# Patient Record
Sex: Female | Born: 1945
Health system: Southern US, Community
[De-identification: ages and names within clinical notes are randomized; demographics above are authoritative.]

## PROBLEM LIST (undated history)

## (undated) DIAGNOSIS — E039 Hypothyroidism, unspecified: Secondary | ICD-10-CM

## (undated) DIAGNOSIS — E119 Type 2 diabetes mellitus without complications: Secondary | ICD-10-CM

## (undated) DIAGNOSIS — E079 Disorder of thyroid, unspecified: Secondary | ICD-10-CM

## (undated) HISTORY — PX: BACK SURGERY: SHX140

## (undated) HISTORY — PX: TOTAL HIP ARTHROPLASTY: SHX124

## (undated) HISTORY — PX: SINOSCOPY: SHX187

## (undated) HISTORY — PX: ABDOMINAL HYSTERECTOMY: SHX81

## (undated) HISTORY — PX: TONSILLECTOMY: SUR1361

## (undated) HISTORY — PX: ADENOIDECTOMY: SUR15

---

## 2010-09-22 DIAGNOSIS — I340 Nonrheumatic mitral (valve) insufficiency: Secondary | ICD-10-CM | POA: Insufficient documentation

## 2010-09-22 DIAGNOSIS — R921 Mammographic calcification found on diagnostic imaging of breast: Secondary | ICD-10-CM | POA: Insufficient documentation

## 2011-10-04 DIAGNOSIS — Z789 Other specified health status: Secondary | ICD-10-CM | POA: Insufficient documentation

## 2012-07-08 DIAGNOSIS — M5137 Other intervertebral disc degeneration, lumbosacral region: Secondary | ICD-10-CM | POA: Insufficient documentation

## 2014-08-26 DIAGNOSIS — E119 Type 2 diabetes mellitus without complications: Secondary | ICD-10-CM | POA: Insufficient documentation

## 2015-06-22 DIAGNOSIS — M8589 Other specified disorders of bone density and structure, multiple sites: Secondary | ICD-10-CM | POA: Insufficient documentation

## 2016-06-27 DIAGNOSIS — D509 Iron deficiency anemia, unspecified: Secondary | ICD-10-CM | POA: Insufficient documentation

## 2016-07-10 DIAGNOSIS — G2581 Restless legs syndrome: Secondary | ICD-10-CM | POA: Insufficient documentation

## 2018-03-19 DIAGNOSIS — M26629 Arthralgia of temporomandibular joint, unspecified side: Secondary | ICD-10-CM | POA: Insufficient documentation

## 2019-10-01 DIAGNOSIS — Z9889 Other specified postprocedural states: Secondary | ICD-10-CM | POA: Insufficient documentation

## 2019-10-27 DIAGNOSIS — E559 Vitamin D deficiency, unspecified: Secondary | ICD-10-CM | POA: Diagnosis not present

## 2019-10-27 DIAGNOSIS — E1165 Type 2 diabetes mellitus with hyperglycemia: Secondary | ICD-10-CM | POA: Diagnosis not present

## 2019-10-27 DIAGNOSIS — E039 Hypothyroidism, unspecified: Secondary | ICD-10-CM | POA: Diagnosis not present

## 2019-10-27 DIAGNOSIS — K219 Gastro-esophageal reflux disease without esophagitis: Secondary | ICD-10-CM | POA: Diagnosis not present

## 2019-10-30 DIAGNOSIS — J301 Allergic rhinitis due to pollen: Secondary | ICD-10-CM | POA: Diagnosis not present

## 2019-10-30 DIAGNOSIS — J3089 Other allergic rhinitis: Secondary | ICD-10-CM | POA: Diagnosis not present

## 2019-11-11 DIAGNOSIS — E1165 Type 2 diabetes mellitus with hyperglycemia: Secondary | ICD-10-CM | POA: Diagnosis not present

## 2019-11-11 DIAGNOSIS — R197 Diarrhea, unspecified: Secondary | ICD-10-CM | POA: Diagnosis not present

## 2019-11-11 DIAGNOSIS — Z0189 Encounter for other specified special examinations: Secondary | ICD-10-CM | POA: Diagnosis not present

## 2019-11-11 DIAGNOSIS — K649 Unspecified hemorrhoids: Secondary | ICD-10-CM | POA: Diagnosis not present

## 2019-11-11 DIAGNOSIS — G2581 Restless legs syndrome: Secondary | ICD-10-CM | POA: Diagnosis not present

## 2019-11-11 DIAGNOSIS — E559 Vitamin D deficiency, unspecified: Secondary | ICD-10-CM | POA: Diagnosis not present

## 2019-11-11 DIAGNOSIS — K219 Gastro-esophageal reflux disease without esophagitis: Secondary | ICD-10-CM | POA: Diagnosis not present

## 2019-11-13 ENCOUNTER — Other Ambulatory Visit: Payer: Self-pay | Admitting: Neurological Surgery

## 2019-11-13 DIAGNOSIS — M02351 Reiter's disease, right hip: Secondary | ICD-10-CM | POA: Diagnosis not present

## 2019-11-16 DIAGNOSIS — Z0001 Encounter for general adult medical examination with abnormal findings: Secondary | ICD-10-CM | POA: Diagnosis not present

## 2019-11-17 ENCOUNTER — Ambulatory Visit
Admission: RE | Admit: 2019-11-17 | Discharge: 2019-11-17 | Disposition: A | Discharge status: Home / Self Care | Payer: Self-pay | Source: Ambulatory Visit | Attending: Neurological Surgery | Admitting: Neurological Surgery

## 2019-11-17 ENCOUNTER — Other Ambulatory Visit: Payer: Self-pay

## 2019-11-17 DIAGNOSIS — M1611 Unilateral primary osteoarthritis, right hip: Secondary | ICD-10-CM | POA: Diagnosis not present

## 2019-11-17 DIAGNOSIS — M02351 Reiter's disease, right hip: Secondary | ICD-10-CM

## 2019-11-17 MED ORDER — METHYLPREDNISOLONE ACETATE 40 MG/ML INJ SUSP (RADIOLOG
120.0000 mg | Freq: Once | INTRAMUSCULAR | Status: AC
  Administered 2019-11-17: 120 mg via INTRA_ARTICULAR

## 2019-11-17 MED ORDER — IOPAMIDOL (ISOVUE-M 200) INJECTION 41%
1.0000 mL | Freq: Once | INTRAMUSCULAR | Status: AC
  Administered 2019-11-17: 1 mL via INTRA_ARTICULAR

## 2019-11-19 DIAGNOSIS — K921 Melena: Secondary | ICD-10-CM | POA: Diagnosis not present

## 2019-11-19 DIAGNOSIS — R197 Diarrhea, unspecified: Secondary | ICD-10-CM | POA: Diagnosis not present

## 2019-11-19 DIAGNOSIS — R1013 Epigastric pain: Secondary | ICD-10-CM | POA: Diagnosis not present

## 2019-11-21 DIAGNOSIS — E1165 Type 2 diabetes mellitus with hyperglycemia: Secondary | ICD-10-CM | POA: Diagnosis not present

## 2019-11-23 ENCOUNTER — Other Ambulatory Visit (HOSPITAL_COMMUNITY): Payer: Self-pay | Admitting: Internal Medicine

## 2019-11-23 DIAGNOSIS — Z1231 Encounter for screening mammogram for malignant neoplasm of breast: Secondary | ICD-10-CM

## 2019-11-23 DIAGNOSIS — E1165 Type 2 diabetes mellitus with hyperglycemia: Secondary | ICD-10-CM | POA: Diagnosis not present

## 2019-11-27 DIAGNOSIS — E1165 Type 2 diabetes mellitus with hyperglycemia: Secondary | ICD-10-CM | POA: Diagnosis not present

## 2019-12-01 DIAGNOSIS — Z1159 Encounter for screening for other viral diseases: Secondary | ICD-10-CM | POA: Diagnosis not present

## 2019-12-04 DIAGNOSIS — R1013 Epigastric pain: Secondary | ICD-10-CM | POA: Diagnosis not present

## 2019-12-04 DIAGNOSIS — K449 Diaphragmatic hernia without obstruction or gangrene: Secondary | ICD-10-CM | POA: Diagnosis not present

## 2019-12-04 DIAGNOSIS — K921 Melena: Secondary | ICD-10-CM | POA: Diagnosis not present

## 2019-12-18 DIAGNOSIS — Z516 Encounter for desensitization to allergens: Secondary | ICD-10-CM | POA: Diagnosis not present

## 2019-12-25 DIAGNOSIS — Z6820 Body mass index (BMI) 20.0-20.9, adult: Secondary | ICD-10-CM | POA: Diagnosis not present

## 2019-12-25 DIAGNOSIS — M02351 Reiter's disease, right hip: Secondary | ICD-10-CM | POA: Diagnosis not present

## 2019-12-25 DIAGNOSIS — I10 Essential (primary) hypertension: Secondary | ICD-10-CM | POA: Diagnosis not present

## 2019-12-28 DIAGNOSIS — E1165 Type 2 diabetes mellitus with hyperglycemia: Secondary | ICD-10-CM | POA: Diagnosis not present

## 2020-01-08 ENCOUNTER — Other Ambulatory Visit: Payer: Self-pay

## 2020-01-08 ENCOUNTER — Ambulatory Visit (INDEPENDENT_AMBULATORY_CARE_PROVIDER_SITE_OTHER): Payer: Medicare Other | Admitting: Allergy & Immunology

## 2020-01-08 ENCOUNTER — Encounter: Payer: Self-pay | Admitting: Allergy & Immunology

## 2020-01-08 VITALS — BP 122/62 | HR 75 | Temp 97.7°F | Resp 16 | Ht 60.0 in | Wt 110.2 lb

## 2020-01-08 DIAGNOSIS — J302 Other seasonal allergic rhinitis: Secondary | ICD-10-CM | POA: Diagnosis not present

## 2020-01-08 DIAGNOSIS — J3089 Other allergic rhinitis: Secondary | ICD-10-CM

## 2020-01-08 MED ORDER — EPINEPHRINE 0.3 MG/0.3ML IJ SOAJ: 0.3000 mg | Freq: Once | INTRAMUSCULAR | 1 refills | Status: AC

## 2020-01-08 NOTE — Patient Instructions (Addendum)
1. Seasonal and perennial allergic rhinitis - We will go ahead and mix up shots with the same concoction that you are getting now.  - Make an appointment to start shots in 2-3 weeks. - We will build up the Red Vial over five weeks and then it can change to monthly. - We do this since we may not use exactly the same extracts that [facility] was doing. - Anaphylaxis management plan provided. - EpiPen script provided.  2. Return in about 3 months (around 04/09/2020).    Please inform us of any Emergency Department visits, hospitalizations, or changes in symptoms. Call us before going to the ED for breathing or allergy symptoms since we might be able to fit you in for a sick visit. Feel free to contact us anytime with any questions, problems, or concerns.  It was a pleasure to meet you and your family today!  Websites that have reliable patient information: 1. American Academy of Asthma, Allergy, and Immunology: www.aaaai.org 2. Food Allergy Research and Education (FARE): foodallergy.org 3. Mothers of Asthmatics: http://www.asthmacommunitynetwork.org 4. American College of Allergy, Asthma, and Immunology: www.acaai.org   COVID-19 Vaccine Information can be found at: PodExchange.nl For questions related to vaccine distribution or appointments, please email [email or web address] or call [phone].     "Like" [place] on Facebook and Instagram for our latest updates!     HAPPY FALL!     Make sure you are registered to vote! If you have moved or changed any of your contact information, you will need to get this updated before voting!  In some cases, you MAY be able to register to vote online: [email or web address]

## 2020-01-08 NOTE — Progress Notes (Signed)
NEW PATIENT  Date of Service/Encounter:  01/10/20  Referring provider: Roe Rutherford, NP   Assessment:   Seasonal and perennial allergic rhinitis (grasses, molds, dust mite) - restarting allergy shots with the same prescription from her previous allergist  Plan/Recommendations:   1. Seasonal and perennial allergic rhinitis - We will go ahead and mix up shots with the same concoction that you are getting now.  - Make an appointment to start shots in 2-3 weeks. - We will build up the Red Vial over five weeks and then it can change to monthly. - We do this since we may not use exactly the same extracts that [facility] was doing. - We can consider retesting in 1-2 years to see if you have lost sensitization. - We did discuss stopping the shots completely, but she was not interested in this at all.  - Anaphylaxis management plan provided. - EpiPen script provided.  2. Return in about 3 months (around 04/09/2020).   Subjective:   Karen Horn is a 74 y.o. female presenting today for evaluation of  Chief Complaint  Patient presents with  . Allergic Rhinitis     Transfer of Care     Karen Horn has a history of the following: Patient Active Problem List   Diagnosis Date Noted  . Seasonal and perennial allergic rhinitis 01/10/2020    History obtained from: chart review and patient.  Karen Horn was referred by Roe Rutherford, NP.     Karen Horn is a 74 y.o. female presenting for to establish care for management of her allergic rhinitis.  Allergic Rhinitis Symptom History: She is allergic to trees, mold, grass, dust mites, mildew. She has been on shots for 15-20 years. She was followed last by [facility]. She previously had eye swelling and itching. Shots are doing very well. She has taken some breaks through the years. Her last testing was done in the 1990s. She does not really want to be re-tested at all. She  has taken some breaks after a few years. The last time she stopped was ten years ago or so.   She currently receives 2 injections.  1 injection contains ragweed, [place] grass, Johnson grass, and Timothy grass.  The other injection contains dust mite, Alternaria, Aspergillus, and Bipolaris. Her last injection was October 29th, 2021.  She gets antibiotics around once per year for bronchitis. But she has not had it in a a year or so.    She got married six months ago and moved up here. Therefore she is needing to establish care with [place] in order to get her allergy shots.   She has been borderline diabetic since the 1980s. She started taking medications in her last 29s or so.   Otherwise, there is no history of other atopic diseases, including asthma, food allergies, drug allergies, stinging insect allergies, eczema, urticaria or contact dermatitis. There is no significant infectious history. Vaccinations are up to date.    Past Medical History: Patient Active Problem List   Diagnosis Date Noted  . Seasonal and perennial allergic rhinitis 01/10/2020    Medication List:  Allergies as of 01/08/2020      Reactions   Sulfa Antibiotics Rash      Medication List       Accurate as of January 08, 2020 11:59 PM. If you have any questions, ask your nurse or doctor.        ALPRAZolam 0.5 MG tablet Commonly known as: Prudy Feeler  Take 0.5 mg by mouth at bedtime as needed for anxiety.   aspirin 81 MG chewable tablet Chew 81 mg by mouth daily.   EPINEPHrine 0.3 mg/0.3 mL Soaj injection Commonly known as: EpiPen 2-Pak Inject 0.3 mg into the muscle once for 1 dose. Started by: Alfonse Spruce, MD   estradiol 0.1 MG/GM vaginal cream Commonly known as: ESTRACE Place 1 Applicatorful vaginally at bedtime.   glipiZIDE 10 MG tablet Commonly known as: GLUCOTROL Take 10 mg by mouth 2 (two) times daily before a meal.   levothyroxine 88 MCG tablet Commonly known as: SYNTHROID Take 88 mcg by  mouth daily before breakfast.   omeprazole 20 MG capsule Commonly known as: PRILOSEC Take 20 mg by mouth daily.   rOPINIRole 0.25 MG tablet Commonly known as: REQUIP Take 0.25 mg by mouth daily.   TRESIBA Kenvir Inject 10 mLs into the skin at bedtime.   valACYclovir 500 MG tablet Commonly known as: VALTREX Take 500 mg by mouth daily.   Vitamin D3 50 MCG (2000 UT) Tabs Take 1 tablet by mouth in the morning and at bedtime.       Birth History: non-contributory  Developmental History: non-contributory  Past Surgical History: Past Surgical History:  Procedure Laterality Date  . ADENOIDECTOMY    . SINOSCOPY    . TONSILLECTOMY       Family History: History reviewed. No pertinent family history.   Social History: Karen Horn lives at home with her husband.  She lives in a house that was built in 1942.  There are hardwoods and rugs throughout the home.  They have gas heating and central cooling.  There is a Bertram [place] terrier. There are no dust mite coverings on the bedding. There is no tobacco exposures at all. She is currently retired.    Review of Systems  Constitutional: Negative.  Negative for chills, fever, malaise/fatigue and weight loss.  HENT: Positive for congestion and sinus pain. Negative for ear discharge and ear pain.   Eyes: Negative for pain, discharge and redness.  Respiratory: Negative for cough, sputum production, shortness of breath and wheezing.   Cardiovascular: Negative.  Negative for chest pain and palpitations.  Gastrointestinal: Negative for abdominal pain, constipation, diarrhea, heartburn, nausea and vomiting.  Skin: Negative.  Negative for itching and rash.  Neurological: Negative for dizziness and headaches.  Endo/Heme/Allergies: Positive for environmental allergies. Does not bruise/bleed easily.       Objective:   Blood pressure 122/62, pulse 75, temperature 97.7 F (36.5 C), temperature source Temporal, resp. rate 16, height 5' (1.524 m),  weight 110 lb 3.2 oz (50 kg), SpO2 96 %. Body mass index is 21.52 kg/m.   Physical Exam:   Physical Exam Constitutional:      Appearance: She is well-developed.  HENT:     Head: Normocephalic and atraumatic.     Right Ear: Tympanic membrane, ear canal and external ear normal. No drainage, swelling or tenderness. Tympanic membrane is not injected, scarred, erythematous, retracted or bulging.     Left Ear: Tympanic membrane, ear canal and external ear normal. No drainage, swelling or tenderness. Tympanic membrane is not injected, scarred, erythematous, retracted or bulging.     Nose: No nasal deformity, septal deviation, mucosal edema or rhinorrhea.     Right Sinus: No maxillary sinus tenderness or frontal sinus tenderness.     Left Sinus: No maxillary sinus tenderness or frontal sinus tenderness.     Mouth/Throat:     Mouth: Mucous membranes are not pale  and not dry.     Pharynx: Uvula midline.  Eyes:     General:        Right eye: No discharge.        Left eye: No discharge.     Conjunctiva/sclera: Conjunctivae normal.     Right eye: Right conjunctiva is not injected. No chemosis.    Left eye: Left conjunctiva is not injected. No chemosis.    Pupils: Pupils are equal, round, and reactive to light.  Cardiovascular:     Rate and Rhythm: Normal rate and regular rhythm.     Heart sounds: Normal heart sounds.  Pulmonary:     Effort: Pulmonary effort is normal. No tachypnea, accessory muscle usage or respiratory distress.     Breath sounds: Normal breath sounds. No wheezing, rhonchi or rales.  Chest:     Chest wall: No tenderness.  Lymphadenopathy:     Head:     Right side of head: No submandibular, tonsillar or occipital adenopathy.     Left side of head: No submandibular, tonsillar or occipital adenopathy.     Cervical: No cervical adenopathy.  Skin:    Coloration: Skin is not pale.     Findings: No abrasion, erythema, petechiae or rash. Rash is not papular, urticarial or  vesicular.  Neurological:     Mental Status: She is alert.      Diagnostic studies: none        Malachi Bonds, MD [facility]

## 2020-01-10 ENCOUNTER — Encounter: Payer: Self-pay | Admitting: Allergy & Immunology

## 2020-01-10 DIAGNOSIS — J302 Other seasonal allergic rhinitis: Secondary | ICD-10-CM | POA: Insufficient documentation

## 2020-01-10 DIAGNOSIS — J3089 Other allergic rhinitis: Secondary | ICD-10-CM | POA: Insufficient documentation

## 2020-01-14 NOTE — Addendum Note (Signed)
Addended by: Alfonse Spruce on: 01/14/2020 09:12 AM   Modules accepted: Orders

## 2020-01-18 DIAGNOSIS — J302 Other seasonal allergic rhinitis: Secondary | ICD-10-CM | POA: Diagnosis not present

## 2020-01-18 NOTE — Progress Notes (Signed)
VIALS EXP 01-17-21 °

## 2020-01-19 DIAGNOSIS — J3089 Other allergic rhinitis: Secondary | ICD-10-CM | POA: Diagnosis not present

## 2020-01-20 DIAGNOSIS — M25561 Pain in right knee: Secondary | ICD-10-CM | POA: Diagnosis not present

## 2020-01-20 DIAGNOSIS — M25551 Pain in right hip: Secondary | ICD-10-CM | POA: Diagnosis not present

## 2020-01-27 ENCOUNTER — Other Ambulatory Visit: Payer: Self-pay

## 2020-01-27 ENCOUNTER — Ambulatory Visit (INDEPENDENT_AMBULATORY_CARE_PROVIDER_SITE_OTHER): Payer: Medicare Other

## 2020-01-27 DIAGNOSIS — J309 Allergic rhinitis, unspecified: Secondary | ICD-10-CM

## 2020-01-27 NOTE — Progress Notes (Signed)
Immunotherapy   Patient Details  Name: Karen Horn MRN: [id number] Date of Birth: Dec 20, 1945  01/27/2020  [name] started injections for  Mold-DM & G-RW Following schedule: C  Frequency:once weekly until @ 0.5cc then she will come every 4 weeks.  Epi-Pen:Epi-Pen Available   Patient waited 30 minutes in office. 1+ local reaction and no systemic symptoms.  Consent signed and patient instructions given.   [name] I [name] 01/27/2020, 11:15 AM

## 2020-01-29 ENCOUNTER — Ambulatory Visit (HOSPITAL_COMMUNITY)
Admission: RE | Admit: 2020-01-29 | Discharge: 2020-01-29 | Disposition: A | Discharge status: Home / Self Care | Payer: Medicare Other | Source: Ambulatory Visit | Attending: Internal Medicine | Admitting: Internal Medicine

## 2020-01-29 ENCOUNTER — Other Ambulatory Visit: Payer: Self-pay

## 2020-01-29 DIAGNOSIS — Z1231 Encounter for screening mammogram for malignant neoplasm of breast: Secondary | ICD-10-CM | POA: Diagnosis not present

## 2020-02-01 ENCOUNTER — Inpatient Hospital Stay
Admission: RE | Admit: 2020-02-01 | Discharge: 2020-02-01 | Disposition: A | Discharge status: Home / Self Care | Payer: Self-pay | Source: Ambulatory Visit | Attending: Internal Medicine | Admitting: Internal Medicine

## 2020-02-01 ENCOUNTER — Other Ambulatory Visit (HOSPITAL_COMMUNITY): Payer: Self-pay | Admitting: Internal Medicine

## 2020-02-01 DIAGNOSIS — Z1231 Encounter for screening mammogram for malignant neoplasm of breast: Secondary | ICD-10-CM

## 2020-02-01 DIAGNOSIS — H43393 Other vitreous opacities, bilateral: Secondary | ICD-10-CM | POA: Diagnosis not present

## 2020-02-01 DIAGNOSIS — H02831 Dermatochalasis of right upper eyelid: Secondary | ICD-10-CM | POA: Diagnosis not present

## 2020-02-01 DIAGNOSIS — H26493 Other secondary cataract, bilateral: Secondary | ICD-10-CM | POA: Diagnosis not present

## 2020-02-01 DIAGNOSIS — H5213 Myopia, bilateral: Secondary | ICD-10-CM | POA: Diagnosis not present

## 2020-02-01 DIAGNOSIS — E119 Type 2 diabetes mellitus without complications: Secondary | ICD-10-CM | POA: Diagnosis not present

## 2020-02-01 DIAGNOSIS — H02834 Dermatochalasis of left upper eyelid: Secondary | ICD-10-CM | POA: Diagnosis not present

## 2020-02-03 ENCOUNTER — Ambulatory Visit (INDEPENDENT_AMBULATORY_CARE_PROVIDER_SITE_OTHER): Payer: Medicare Other

## 2020-02-03 DIAGNOSIS — J309 Allergic rhinitis, unspecified: Secondary | ICD-10-CM

## 2020-02-10 ENCOUNTER — Ambulatory Visit (INDEPENDENT_AMBULATORY_CARE_PROVIDER_SITE_OTHER): Payer: Medicare Other

## 2020-02-10 DIAGNOSIS — J309 Allergic rhinitis, unspecified: Secondary | ICD-10-CM | POA: Diagnosis not present

## 2020-02-15 ENCOUNTER — Other Ambulatory Visit (HOSPITAL_COMMUNITY): Payer: Self-pay | Admitting: Internal Medicine

## 2020-02-15 DIAGNOSIS — R928 Other abnormal and inconclusive findings on diagnostic imaging of breast: Secondary | ICD-10-CM

## 2020-02-17 ENCOUNTER — Ambulatory Visit (INDEPENDENT_AMBULATORY_CARE_PROVIDER_SITE_OTHER): Payer: Medicare Other

## 2020-02-17 DIAGNOSIS — J309 Allergic rhinitis, unspecified: Secondary | ICD-10-CM | POA: Diagnosis not present

## 2020-02-18 ENCOUNTER — Other Ambulatory Visit: Payer: Self-pay

## 2020-02-18 ENCOUNTER — Ambulatory Visit (HOSPITAL_COMMUNITY)
Admission: RE | Admit: 2020-02-18 | Discharge: 2020-02-18 | Disposition: A | Discharge status: Home / Self Care | Payer: Medicare Other | Source: Ambulatory Visit | Attending: Internal Medicine | Admitting: Internal Medicine

## 2020-02-18 DIAGNOSIS — N6489 Other specified disorders of breast: Secondary | ICD-10-CM | POA: Diagnosis not present

## 2020-02-18 DIAGNOSIS — R928 Other abnormal and inconclusive findings on diagnostic imaging of breast: Secondary | ICD-10-CM | POA: Diagnosis not present

## 2020-02-24 ENCOUNTER — Ambulatory Visit (INDEPENDENT_AMBULATORY_CARE_PROVIDER_SITE_OTHER): Payer: Medicare Other

## 2020-02-24 DIAGNOSIS — J309 Allergic rhinitis, unspecified: Secondary | ICD-10-CM | POA: Diagnosis not present

## 2020-02-24 DIAGNOSIS — M62831 Muscle spasm of calf: Secondary | ICD-10-CM | POA: Diagnosis not present

## 2020-02-24 DIAGNOSIS — E782 Mixed hyperlipidemia: Secondary | ICD-10-CM | POA: Diagnosis not present

## 2020-02-24 DIAGNOSIS — R197 Diarrhea, unspecified: Secondary | ICD-10-CM | POA: Diagnosis not present

## 2020-02-24 DIAGNOSIS — E1165 Type 2 diabetes mellitus with hyperglycemia: Secondary | ICD-10-CM | POA: Diagnosis not present

## 2020-02-24 DIAGNOSIS — K649 Unspecified hemorrhoids: Secondary | ICD-10-CM | POA: Diagnosis not present

## 2020-02-24 DIAGNOSIS — G2581 Restless legs syndrome: Secondary | ICD-10-CM | POA: Diagnosis not present

## 2020-02-24 DIAGNOSIS — E559 Vitamin D deficiency, unspecified: Secondary | ICD-10-CM | POA: Diagnosis not present

## 2020-02-24 DIAGNOSIS — K219 Gastro-esophageal reflux disease without esophagitis: Secondary | ICD-10-CM | POA: Diagnosis not present

## 2020-02-25 ENCOUNTER — Emergency Department (HOSPITAL_COMMUNITY): Payer: Medicare Other

## 2020-02-25 ENCOUNTER — Other Ambulatory Visit: Payer: Self-pay

## 2020-02-25 ENCOUNTER — Emergency Department (HOSPITAL_COMMUNITY)
Admission: EM | Admit: 2020-02-25 | Discharge: 2020-02-26 | Disposition: A | Discharge status: Home / Self Care | Payer: Medicare Other | Attending: Emergency Medicine | Admitting: Emergency Medicine

## 2020-02-25 DIAGNOSIS — G610 Guillain-Barre syndrome: Secondary | ICD-10-CM | POA: Diagnosis not present

## 2020-02-25 DIAGNOSIS — Z79899 Other long term (current) drug therapy: Secondary | ICD-10-CM | POA: Insufficient documentation

## 2020-02-25 DIAGNOSIS — M25551 Pain in right hip: Secondary | ICD-10-CM | POA: Insufficient documentation

## 2020-02-25 DIAGNOSIS — R29898 Other symptoms and signs involving the musculoskeletal system: Secondary | ICD-10-CM

## 2020-02-25 DIAGNOSIS — G8220 Paraplegia, unspecified: Secondary | ICD-10-CM | POA: Diagnosis not present

## 2020-02-25 DIAGNOSIS — R519 Headache, unspecified: Secondary | ICD-10-CM

## 2020-02-25 DIAGNOSIS — R200 Anesthesia of skin: Secondary | ICD-10-CM | POA: Diagnosis not present

## 2020-02-25 DIAGNOSIS — M6281 Muscle weakness (generalized): Secondary | ICD-10-CM | POA: Insufficient documentation

## 2020-02-25 DIAGNOSIS — R29818 Other symptoms and signs involving the nervous system: Secondary | ICD-10-CM | POA: Diagnosis not present

## 2020-02-25 DIAGNOSIS — R202 Paresthesia of skin: Secondary | ICD-10-CM | POA: Diagnosis not present

## 2020-02-25 DIAGNOSIS — G839 Paralytic syndrome, unspecified: Secondary | ICD-10-CM | POA: Diagnosis not present

## 2020-02-25 DIAGNOSIS — G9389 Other specified disorders of brain: Secondary | ICD-10-CM | POA: Diagnosis not present

## 2020-02-25 DIAGNOSIS — R9431 Abnormal electrocardiogram [ECG] [EKG]: Secondary | ICD-10-CM | POA: Diagnosis not present

## 2020-02-25 DIAGNOSIS — R531 Weakness: Secondary | ICD-10-CM | POA: Diagnosis not present

## 2020-02-25 DIAGNOSIS — Z20822 Contact with and (suspected) exposure to covid-19: Secondary | ICD-10-CM | POA: Insufficient documentation

## 2020-02-25 DIAGNOSIS — Z7982 Long term (current) use of aspirin: Secondary | ICD-10-CM | POA: Diagnosis not present

## 2020-02-25 LAB — CBC WITH DIFFERENTIAL/PLATELET
Abs Immature Granulocytes: 0.01 10*3/uL (ref 0.00–0.07)
Basophils Absolute: 0 10*3/uL (ref 0.0–0.1)
Basophils Relative: 1 %
Eosinophils Absolute: 0.1 10*3/uL (ref 0.0–0.5)
Eosinophils Relative: 2 %
HCT: 45.6 % (ref 36.0–46.0)
Hemoglobin: 14.5 g/dL (ref 12.0–15.0)
Immature Granulocytes: 0 %
Lymphocytes Relative: 33 %
Lymphs Abs: 2.6 10*3/uL (ref 0.7–4.0)
MCH: 29.5 pg (ref 26.0–34.0)
MCHC: 31.8 g/dL (ref 30.0–36.0)
MCV: 92.9 fL (ref 80.0–100.0)
Monocytes Absolute: 0.4 10*3/uL (ref 0.1–1.0)
Monocytes Relative: 5 %
Neutro Abs: 4.8 10*3/uL (ref 1.7–7.7)
Neutrophils Relative %: 59 %
Platelets: 249 10*3/uL (ref 150–400)
RBC: 4.91 MIL/uL (ref 3.87–5.11)
RDW: 12.8 % (ref 11.5–15.5)
WBC: 8 10*3/uL (ref 4.0–10.5)
nRBC: 0 % (ref 0.0–0.2)

## 2020-02-25 LAB — RESP PANEL BY RT-PCR (FLU A&B, COVID) ARPGX2
Influenza A by PCR: NEGATIVE
Influenza B by PCR: NEGATIVE
SARS Coronavirus 2 by RT PCR: NEGATIVE

## 2020-02-25 LAB — POC SARS CORONAVIRUS 2 AG -  ED: SARS Coronavirus 2 Ag: NEGATIVE

## 2020-02-25 NOTE — ED Provider Notes (Signed)
[facility] EMERGENCY DEPARTMENT Provider Note   CSN: [id number] Arrival date & time: 02/25/20  1959     History Chief Complaint  Patient presents with  . Hip Pain    Karen Horn is a 75 y.o. female.  HPI Patient is here for constellation of problems that occurred while she was "slow dancing", tonight.  This occurred about an hour ago.  While dancing, her right leg seemed to give way and she had trouble using it.  That problem has improved and she can now ambulate but has some mild residual numbness of her entire right leg.  She also noticed some "numbness of my lips," indicating bilateral upper and lower.  Also at that time she notes a left-sided headache.  All of these symptoms came on at the same time.  She has improved and that she can now walk and has less headache and no numbness of her face.  No prior similar problems.  No history of migraines.  She denies fever, chills, cough or shortness of breath.  She has not had Covid vaccines.  There are no other known modifying factors.    No past medical history on file.  Patient Active Problem List   Diagnosis Date Noted  . Seasonal and perennial allergic rhinitis 01/10/2020    Past Surgical History:  Procedure Laterality Date  . ADENOIDECTOMY    . SINOSCOPY    . TONSILLECTOMY       OB History   No obstetric history on file.     No family history on file.  Social History   Tobacco Use  . Smoking status: Never Smoker  . Smokeless tobacco: Never Used  Vaping Use  . Vaping Use: Never used  Substance Use Topics  . Alcohol use: Not Currently  . Drug use: Never    Home Medications Prior to Admission medications   Medication Sig Start Date End Date Taking? Authorizing Provider  ALPRAZolam Duanne Moron) 0.5 MG tablet Take 0.5 mg by mouth at bedtime as needed for anxiety.    [provider]  aspirin 81 MG chewable tablet Chew 81 mg by mouth daily.    [provider]  Cholecalciferol (VITAMIN D3) 50 MCG  (2000 UT) TABS Take 1 tablet by mouth in the morning and at bedtime.    [provider]  estradiol (ESTRACE) 0.1 MG/GM vaginal cream Place 1 Applicatorful vaginally at bedtime.    [provider]  glipiZIDE (GLUCOTROL) 10 MG tablet Take 10 mg by mouth 2 (two) times daily before a meal.    [provider]  Insulin Degludec (TRESIBA Primera) Inject 10 mLs into the skin at bedtime.    [provider]  levothyroxine (SYNTHROID) 88 MCG tablet Take 88 mcg by mouth daily before breakfast.    [provider]  omeprazole (PRILOSEC) 20 MG capsule Take 20 mg by mouth daily.    [provider]  rOPINIRole (REQUIP) 0.25 MG tablet Take 0.25 mg by mouth daily.    [provider]  valACYclovir (VALTREX) 500 MG tablet Take 500 mg by mouth daily.    [provider]    Allergies    Sulfa antibiotics  Review of Systems   Review of Systems  All other systems reviewed and are negative.   Physical Exam Updated Vital Signs BP (!) 170/83   Pulse (!) 106   Temp 98.5 F (36.9 C) (Oral)   Resp 17   SpO2 96%   Physical Exam Vitals and nursing note reviewed.  Constitutional:      General: She is not in acute distress.    Appearance: She is well-developed and well-nourished. She is not ill-appearing, toxic-appearing or diaphoretic.  HENT:     Head: Normocephalic and atraumatic.     Right Ear: External ear normal.     Left Ear: External ear normal.     Mouth/Throat:     Pharynx: No oropharyngeal exudate or posterior oropharyngeal erythema.  Eyes:     Extraocular Movements: EOM normal.     Conjunctiva/sclera: Conjunctivae normal.     Pupils: Pupils are equal, round, and reactive to light.  Neck:     Trachea: Phonation normal.  Cardiovascular:     Rate and Rhythm: Normal rate and regular rhythm.     Heart sounds: Normal heart sounds.  Pulmonary:     Effort: Pulmonary effort is normal.     Breath sounds: Normal breath sounds.  Chest:      Chest wall: No bony tenderness.  Abdominal:     General: There is no distension.     Palpations: Abdomen is soft.     Tenderness: There is no abdominal tenderness.  Musculoskeletal:        General: Normal range of motion.     Cervical back: Normal range of motion and neck supple.     Comments: Normal strength arms and legs bilaterally.  Skin:    General: Skin is warm, dry and intact.  Neurological:     Mental Status: She is alert and oriented to person, place, and time.     Cranial Nerves: No cranial nerve deficit.     Motor: No abnormal muscle tone.     Coordination: Coordination normal.     Comments: No dysarthria, aphasia or nystagmus.  No pronator drift.  No dysesthesia of the face.  No ataxia.  Mild subjective numbness of the right thigh.  Psychiatric:        Mood and Affect: Mood and affect and mood normal.        Behavior: Behavior normal.        Thought Content: Thought content normal.        Judgment: Judgment normal.     ED Results / Procedures / Treatments   Labs (all labs ordered are listed, but only abnormal results are displayed) Labs Reviewed  RESP PANEL BY RT-PCR (FLU A&B, COVID) [id number]  BASIC METABOLIC PANEL  CBC WITH DIFFERENTIAL/PLATELET  POC SARS CORONAVIRUS 2 AG -  ED    EKG None  Radiology CT Head Wo Contrast  Result Date: 02/25/2020 CLINICAL DATA:  Right lower extremity paralysis. EXAM: CT HEAD WITHOUT CONTRAST TECHNIQUE: Contiguous axial images were obtained from the base of the skull through the vertex without intravenous contrast. COMPARISON:  None. FINDINGS: Brain: There is mild cerebral atrophy with widening of the extra-axial spaces and ventricular dilatation. There are areas of decreased attenuation within the white matter tracts of the supratentorial brain, consistent with microvascular disease changes. Vascular: No hyperdense vessel or unexpected calcification. Skull: Normal. Negative for fracture or focal lesion. Sinuses/Orbits: No acute  finding. Other: None. IMPRESSION: 1. Generalized cerebral atrophy. 2. No acute intracranial abnormality. Electronically Signed   By: Aram Candela M.D.   On: 02/25/2020 21:55    Procedures Procedures (including critical care time)  Medications Ordered in ED Medications - No data to display  ED Course  I have reviewed the triage vital signs and the nursing notes.  Pertinent labs & imaging results that were available during my  care of the patient were reviewed by me and considered in my medical decision making (see chart for details).    MDM Rules/Calculators/A&P                           Patient Vitals for the past 24 hrs:  BP Temp Temp src Pulse Resp SpO2  02/25/20 2046 (!) 170/83 - - - - -  02/25/20 2031 (!) 164/101 98.5 F (36.9 C) Oral (!) 106 17 96 %    11: 05 PM Reevaluation with update and discussion. After initial assessment and treatment, an updated evaluation reveals she complains of persistent headache, which is bioccipital, and also has soreness in her right shoulder at this time.  She still has a funny feeling in her right leg but is able to extend it easily without limitation.  Patient understands the MRI imaging is not available at this time and is willing to stay for that.  It has been ordered and will be done, tomorrow morning.Mancel Bale   Medical Decision Making:  This patient is presenting for evaluation of left-sided headache, and right-sided numbness, which does require a range of treatment options, and is a complaint that involves a moderate risk of morbidity and mortality. The differential diagnoses include migraine headache, CVA. I decided to review old records, and in summary patient presenting for difficulty moving right leg, numbness right leg, and headache.  Right leg weakness improved spontaneously, prior to arrival.  Patient was not code stroke on arrival..  I did not require additional historical information from anyone.  Clinical Laboratory  Tests Ordered, included CBC and Metabolic panel.  Radiologic Tests Ordered, included CT head.  I independently Visualized: CT images, which show no stroke or intracranial abnormality    Critical Interventions-clinical evaluation, laboratory testing, CT imaging head, observation reassess  After These Interventions, the Patient was reevaluated and was found stable without worsening symptoms in the ED.  Patient requires MRI imaging of the brain to rule out a CVA.  She had improvement of symptoms that started prior to arrival with some residual symptoms.  CRITICAL CARE-no Performed by: Mancel Bale  Nursing Notes Reviewed/ Care Coordinated Applicable Imaging Reviewed Interpretation of Laboratory Data incorporated into ED treatment  Disposition by oncoming provider team following MRI brain imaging tomorrow morning    Final Clinical Impression(s) / ED Diagnoses Final diagnoses:  Intractable headache, unspecified chronicity pattern, unspecified headache type  Right leg weakness  Paresthesia    Rx / DC Orders ED Discharge Orders    None       Mancel Bale, MD 02/25/20 2317

## 2020-02-25 NOTE — ED Triage Notes (Signed)
Pt c/o right hip pain while dancing this evening. Pt states that she was unable to make her leg move for 5-10 minutes.  Pt states when she could move her leg she could see it move but not feel it move.  Pt also got an excruciating headache, broke out in a cold sweat and her lips went numb.  Pt states she does not have numbness now but does have pain in her rt hip, which needs to be replaced.  EDP to see pt at 2030.

## 2020-02-26 ENCOUNTER — Other Ambulatory Visit: Payer: Self-pay

## 2020-02-26 ENCOUNTER — Emergency Department (HOSPITAL_COMMUNITY): Payer: Medicare Other

## 2020-02-26 DIAGNOSIS — R29818 Other symptoms and signs involving the nervous system: Secondary | ICD-10-CM | POA: Diagnosis not present

## 2020-02-26 DIAGNOSIS — R200 Anesthesia of skin: Secondary | ICD-10-CM | POA: Diagnosis not present

## 2020-02-26 LAB — BASIC METABOLIC PANEL
Anion gap: 8 (ref 5–15)
BUN: 19 mg/dL (ref 8–23)
CO2: 26 mmol/L (ref 22–32)
Calcium: 9.8 mg/dL (ref 8.9–10.3)
Chloride: 101 mmol/L (ref 98–111)
Creatinine, Ser: 0.71 mg/dL (ref 0.44–1.00)
GFR, Estimated: >60 mL/min (ref >60)
Glucose, Bld: 137 mg/dL — ABNORMAL HIGH (ref 70–99)
Potassium: 3.6 mmol/L (ref 3.5–5.1)
Sodium: 135 mmol/L (ref 135–145)

## 2020-02-26 LAB — CBG MONITORING, ED: Glucose-Capillary: 132 mg/dL — ABNORMAL HIGH (ref 70–99)

## 2020-02-26 MED ORDER — PANTOPRAZOLE SODIUM 40 MG PO TBEC: 40.0000 mg | DELAYED_RELEASE_TABLET | Freq: Every day | ORAL | Status: DC

## 2020-02-26 MED ORDER — ACETAMINOPHEN 325 MG PO TABS
650.0000 mg | ORAL_TABLET | Freq: Once | ORAL | Status: AC
  Administered 2020-02-26: 650 mg via ORAL
  Filled 2020-02-26: qty 2

## 2020-02-26 MED ORDER — ASPIRIN 81 MG PO CHEW: 81.0000 mg | CHEWABLE_TABLET | Freq: Every day | ORAL | Status: DC

## 2020-02-26 MED ORDER — ALPRAZOLAM 0.5 MG PO TABS: 0.5000 mg | ORAL_TABLET | Freq: Every evening | ORAL | Status: DC | PRN

## 2020-02-26 MED ORDER — LEVOTHYROXINE SODIUM 88 MCG PO TABS: 88.0000 ug | ORAL_TABLET | Freq: Every day | ORAL | Status: DC

## 2020-02-26 MED ORDER — ONDANSETRON HCL 4 MG/2ML IJ SOLN
4.0000 mg | Freq: Once | INTRAMUSCULAR | Status: AC
  Administered 2020-02-26: 4 mg via INTRAVENOUS
  Filled 2020-02-26: qty 2

## 2020-02-26 MED ORDER — ROPINIROLE HCL 0.25 MG PO TABS: 0.2500 mg | ORAL_TABLET | Freq: Every day | ORAL | Status: DC

## 2020-02-26 NOTE — ED Notes (Signed)
Pt to restroom, ambulated without complaints of pain

## 2020-02-26 NOTE — ED Provider Notes (Signed)
Patient is already improving. There is no arm drift. No leg drift, but she does have pain with movement of right hip. Patient reports this is a chronic problem. She also reports the numbness is improved. Plan is for patient to have MRI in the morning of her brain, and if negative she can be discharged home.  tPA in stroke considered but not given due to: Rapid improvement/severity mild   ED ECG REPORT   Date: 02/26/2020 0023am  Rate: 78  Rhythm: normal sinus rhythm  QRS Axis: normal  Intervals: normal  ST/T Wave abnormalities: nonspecific ST changes  Conduction Disutrbances:none  Narrative Interpretation:   Old EKG Reviewed: none available  I have personally reviewed the EKG tracing and agree with the computerized printout as noted.    Zadie Rhine, MD 02/26/20 [phone]

## 2020-02-26 NOTE — ED Provider Notes (Signed)
Blood pressure 122/60, pulse 68, temperature 98.5 F (36.9 C), temperature source Oral, resp. rate 17, SpO2 98 %.  Assuming care from Dr. Bebe Shaggy.  In short, Karen Horn is a 75 y.o. female with a chief complaint of Hip Pain .  Refer to the original H&P for additional details.  The current plan of care is to follow-up on MRI brain this morning and reassess.  08:13 AM  Patient's MRI of the brain reviewed showing no acute infarct or other finding.  Patient is not having neck or back pain to explain symptoms.  She has been ambulatory here in the emergency department with steady gait and mild but more chronic soreness in the right hip.  She has normal range of motion of the right hip and equal strength in the bilateral upper and lower extremities.  No facial asymmetry or other symptoms.  Question complicated migraine to cause symptoms.  Patient will follow with her orthopedist regarding her hip that she has been told it may require replacement in the not too distant future.  She will use topical pain medications which she has at home.  Discussed ED return precautions in detail.    Maia Plan, MD 02/26/20 [phone]

## 2020-02-26 NOTE — Discharge Instructions (Signed)
You were seen in the emerge department today with right leg pain and weakness along with headache.  Your MRI of the brain today showed no stroke.  I would like for you to follow with your orthopedist and primary care doctor in the coming week.  I have also listed the name of a neurologist who would like for you to call today and schedule an appointment for next week as well to review your ED symptoms and guide any further evaluation needed for your symptoms last night.  If you develop new or suddenly worsening symptoms please call 911 and/or return to the emergency department immediately.

## 2020-02-26 NOTE — ED Notes (Signed)
Pt to MRI

## 2020-03-01 DIAGNOSIS — F5101 Primary insomnia: Secondary | ICD-10-CM | POA: Diagnosis not present

## 2020-03-01 DIAGNOSIS — G2581 Restless legs syndrome: Secondary | ICD-10-CM | POA: Diagnosis not present

## 2020-03-01 DIAGNOSIS — M25551 Pain in right hip: Secondary | ICD-10-CM | POA: Diagnosis not present

## 2020-03-01 DIAGNOSIS — M545 Low back pain, unspecified: Secondary | ICD-10-CM | POA: Diagnosis not present

## 2020-03-01 DIAGNOSIS — E559 Vitamin D deficiency, unspecified: Secondary | ICD-10-CM | POA: Diagnosis not present

## 2020-03-01 DIAGNOSIS — E039 Hypothyroidism, unspecified: Secondary | ICD-10-CM | POA: Diagnosis not present

## 2020-03-01 DIAGNOSIS — K649 Unspecified hemorrhoids: Secondary | ICD-10-CM | POA: Diagnosis not present

## 2020-03-01 DIAGNOSIS — E1165 Type 2 diabetes mellitus with hyperglycemia: Secondary | ICD-10-CM | POA: Diagnosis not present

## 2020-03-01 DIAGNOSIS — K219 Gastro-esophageal reflux disease without esophagitis: Secondary | ICD-10-CM | POA: Diagnosis not present

## 2020-03-01 DIAGNOSIS — R197 Diarrhea, unspecified: Secondary | ICD-10-CM | POA: Diagnosis not present

## 2020-03-01 DIAGNOSIS — E782 Mixed hyperlipidemia: Secondary | ICD-10-CM | POA: Diagnosis not present

## 2020-03-02 ENCOUNTER — Ambulatory Visit (INDEPENDENT_AMBULATORY_CARE_PROVIDER_SITE_OTHER): Payer: Medicare Other

## 2020-03-02 DIAGNOSIS — M25551 Pain in right hip: Secondary | ICD-10-CM | POA: Diagnosis not present

## 2020-03-02 DIAGNOSIS — J309 Allergic rhinitis, unspecified: Secondary | ICD-10-CM | POA: Diagnosis not present

## 2020-03-30 ENCOUNTER — Ambulatory Visit (INDEPENDENT_AMBULATORY_CARE_PROVIDER_SITE_OTHER): Payer: Medicare Other

## 2020-03-30 DIAGNOSIS — J309 Allergic rhinitis, unspecified: Secondary | ICD-10-CM

## 2020-04-27 ENCOUNTER — Ambulatory Visit: Payer: Medicare Other | Admitting: Allergy & Immunology

## 2020-04-27 ENCOUNTER — Other Ambulatory Visit: Payer: Self-pay

## 2020-04-27 ENCOUNTER — Encounter: Payer: Self-pay | Admitting: Allergy & Immunology

## 2020-04-27 VITALS — BP 138/62 | HR 78 | Temp 97.1°F | Resp 18

## 2020-04-27 DIAGNOSIS — J309 Allergic rhinitis, unspecified: Secondary | ICD-10-CM | POA: Diagnosis not present

## 2020-04-27 DIAGNOSIS — J3089 Other allergic rhinitis: Secondary | ICD-10-CM

## 2020-04-27 DIAGNOSIS — J302 Other seasonal allergic rhinitis: Secondary | ICD-10-CM

## 2020-04-27 NOTE — Patient Instructions (Addendum)
1. Seasonal and perennial allergic rhinitis - Continue with allergy shots at the same schedule. - EpiPen is up to date.  - Continue with your antihistamine prior to allergy shots.  2. Return in about 1 year (around 04/27/2021).    Please inform us of any Emergency Department visits, hospitalizations, or changes in symptoms. Call us before going to the ED for breathing or allergy symptoms since we might be able to fit you in for a sick visit. Feel free to contact us anytime with any questions, problems, or concerns.  It was a pleasure to see you again today!  Websites that have reliable patient information: 1. American Academy of Asthma, Allergy, and Immunology: www.aaaai.org 2. Food Allergy Research and Education (FARE): foodallergy.org 3. Mothers of Asthmatics: http://www.asthmacommunitynetwork.org 4. American College of Allergy, Asthma, and Immunology: www.acaai.org   COVID-19 Vaccine Information can be found at: PodExchange.nl For questions related to vaccine distribution or appointments, please email [email or web address] or call [phone].   We realize that you might be concerned about having an allergic reaction to the COVID19 vaccines. To help with that concern, WE ARE OFFERING THE COVID19 VACCINES IN OUR OFFICE! Ask the front desk for dates!     "Like" [place] on Facebook and Instagram for our latest updates!      A healthy democracy works best when [employer] participate! Make sure you are registered to vote! If you have moved or changed any of your contact information, you will need to get this updated before voting!  In some cases, you MAY be able to register to vote online: [email or web address]

## 2020-04-27 NOTE — Progress Notes (Signed)
FOLLOW UP  Date of Service/Encounter:  04/27/20   Assessment:   Seasonal and perennial allergic rhinitis (grasses, molds, dust mite) - on allergen immunotherapy for around 15 years   Plan/Recommendations:   1. Seasonal and perennial allergic rhinitis - Continue with allergy shots at the same schedule. - EpiPen is up to date.  - Continue with your antihistamine prior to allergy shots.  2. Return in about 1 year (around 04/27/2021).   Subjective:   Karen Horn is a 75 y.o. female presenting today for follow up of  Chief Complaint  Patient presents with  . Follow-up    Karen Horn has a history of the following: Patient Active Problem List   Diagnosis Date Noted  . Seasonal and perennial allergic rhinitis 01/10/2020    History obtained from: chart review and patient.  Karen Horn is a 75 y.o. female presenting for a follow up visit.  She was last seen in November 2021.  At that time, she was establishing care here from her previous allergy office in the [place] area.  We did not retest her since she has been tested recently.   In the interim, she has restarted her allergy shots and has tolerated the new mix. She is now back to every 4 week injections. She feels great with this regimen.   Karen Horn is on allergen immunotherapy. She receives two injections. Immunotherapy script #1 contains molds and dust mites. She currently receives 0.110mL of the RED vial (1/100). Immunotherapy script #2 contains ragweed and grasses. She currently receives 0.33mL of the RED vial (1/100). She is tolerating the shots without a problem. She has not had anything in the way of large local reactions and certainly no systemic reactions.   Otherwise, there have been no changes to her past medical history, surgical history, family history, or social history.    Review of Systems  Constitutional: Negative.  Negative for chills, fever, malaise/fatigue and weight loss.  HENT: Negative.   Negative for congestion, ear discharge, ear pain and sore throat.   Eyes: Negative for pain, discharge and redness.  Respiratory: Negative for cough, sputum production, shortness of breath and wheezing.   Cardiovascular: Negative.  Negative for chest pain and palpitations.  Gastrointestinal: Negative for abdominal pain, constipation, diarrhea, heartburn, nausea and vomiting.  Skin: Negative.  Negative for itching and rash.  Neurological: Negative for dizziness and headaches.  Endo/Heme/Allergies: Negative for environmental allergies. Does not bruise/bleed easily.       Objective:   Blood pressure 138/62, pulse 78, temperature (!) 97.1 F (36.2 C), temperature source Temporal, resp. rate 18, SpO2 95 %. There is no height or weight on file to calculate BMI.   Physical Exam:  Physical Exam Constitutional:      Appearance: She is well-developed.  HENT:     Head: Normocephalic and atraumatic.     Right Ear: Tympanic membrane, ear canal and external ear normal.     Left Ear: Tympanic membrane and ear canal normal.     Nose: No nasal deformity, septal deviation, mucosal edema or rhinorrhea.     Right Sinus: No maxillary sinus tenderness or frontal sinus tenderness.     Left Sinus: No maxillary sinus tenderness or frontal sinus tenderness.     Mouth/Throat:     Mouth: Mucous membranes are not pale and not dry.     Pharynx: Uvula midline.  Eyes:     General:        Right eye: No discharge.  Left eye: No discharge.     Conjunctiva/sclera: Conjunctivae normal.     Right eye: Right conjunctiva is not injected. No chemosis.    Left eye: Left conjunctiva is not injected. No chemosis.    Pupils: Pupils are equal, round, and reactive to light.  Cardiovascular:     Rate and Rhythm: Normal rate and regular rhythm.     Heart sounds: Normal heart sounds.  Pulmonary:     Effort: Pulmonary effort is normal. No tachypnea, accessory muscle usage or respiratory distress.     Breath  sounds: Normal breath sounds. No wheezing, rhonchi or rales.  Chest:     Chest wall: No tenderness.  Lymphadenopathy:     Cervical: No cervical adenopathy.  Skin:    Coloration: Skin is not pale.     Findings: No abrasion, erythema, petechiae or rash. Rash is not papular, urticarial or vesicular.  Neurological:     Mental Status: She is alert.      Diagnostic studies: none     Karen Bonds, MD  [facility]

## 2020-04-29 ENCOUNTER — Encounter: Payer: Self-pay | Admitting: Allergy & Immunology

## 2020-05-18 ENCOUNTER — Ambulatory Visit (INDEPENDENT_AMBULATORY_CARE_PROVIDER_SITE_OTHER): Payer: Medicare Other

## 2020-05-18 DIAGNOSIS — J309 Allergic rhinitis, unspecified: Secondary | ICD-10-CM

## 2020-06-15 ENCOUNTER — Ambulatory Visit (INDEPENDENT_AMBULATORY_CARE_PROVIDER_SITE_OTHER): Payer: Medicare Other

## 2020-06-15 DIAGNOSIS — J309 Allergic rhinitis, unspecified: Secondary | ICD-10-CM | POA: Diagnosis not present

## 2020-07-13 ENCOUNTER — Ambulatory Visit (INDEPENDENT_AMBULATORY_CARE_PROVIDER_SITE_OTHER): Payer: Medicare Other

## 2020-07-13 DIAGNOSIS — J309 Allergic rhinitis, unspecified: Secondary | ICD-10-CM

## 2020-07-28 NOTE — Progress Notes (Signed)
VIALS MADE.  EXP 07-28-21 

## 2020-08-01 DIAGNOSIS — J302 Other seasonal allergic rhinitis: Secondary | ICD-10-CM | POA: Diagnosis not present

## 2020-08-02 DIAGNOSIS — J3089 Other allergic rhinitis: Secondary | ICD-10-CM | POA: Diagnosis not present

## 2020-08-03 DIAGNOSIS — E1165 Type 2 diabetes mellitus with hyperglycemia: Secondary | ICD-10-CM | POA: Insufficient documentation

## 2020-08-10 ENCOUNTER — Ambulatory Visit (INDEPENDENT_AMBULATORY_CARE_PROVIDER_SITE_OTHER): Payer: Medicare Other

## 2020-08-10 DIAGNOSIS — J309 Allergic rhinitis, unspecified: Secondary | ICD-10-CM | POA: Diagnosis not present

## 2020-09-07 ENCOUNTER — Ambulatory Visit (INDEPENDENT_AMBULATORY_CARE_PROVIDER_SITE_OTHER): Payer: Medicare Other | Admitting: *Deleted

## 2020-09-07 DIAGNOSIS — J309 Allergic rhinitis, unspecified: Secondary | ICD-10-CM

## 2020-09-14 ENCOUNTER — Ambulatory Visit (INDEPENDENT_AMBULATORY_CARE_PROVIDER_SITE_OTHER): Payer: Medicare Other | Admitting: *Deleted

## 2020-09-14 DIAGNOSIS — J309 Allergic rhinitis, unspecified: Secondary | ICD-10-CM

## 2020-09-20 DIAGNOSIS — K219 Gastro-esophageal reflux disease without esophagitis: Secondary | ICD-10-CM | POA: Insufficient documentation

## 2020-09-20 DIAGNOSIS — E039 Hypothyroidism, unspecified: Secondary | ICD-10-CM | POA: Insufficient documentation

## 2020-09-20 DIAGNOSIS — G20 Parkinson's disease: Secondary | ICD-10-CM | POA: Insufficient documentation

## 2020-09-20 DIAGNOSIS — E0590 Thyrotoxicosis, unspecified without thyrotoxic crisis or storm: Secondary | ICD-10-CM | POA: Insufficient documentation

## 2020-09-21 ENCOUNTER — Ambulatory Visit (INDEPENDENT_AMBULATORY_CARE_PROVIDER_SITE_OTHER): Payer: Medicare Other

## 2020-09-21 DIAGNOSIS — J309 Allergic rhinitis, unspecified: Secondary | ICD-10-CM | POA: Diagnosis not present

## 2020-09-28 ENCOUNTER — Ambulatory Visit (INDEPENDENT_AMBULATORY_CARE_PROVIDER_SITE_OTHER): Payer: Medicare Other | Admitting: *Deleted

## 2020-09-28 DIAGNOSIS — J309 Allergic rhinitis, unspecified: Secondary | ICD-10-CM

## 2020-09-29 DIAGNOSIS — N959 Unspecified menopausal and perimenopausal disorder: Secondary | ICD-10-CM | POA: Insufficient documentation

## 2020-09-29 DIAGNOSIS — M25552 Pain in left hip: Secondary | ICD-10-CM | POA: Insufficient documentation

## 2020-09-29 DIAGNOSIS — E559 Vitamin D deficiency, unspecified: Secondary | ICD-10-CM | POA: Insufficient documentation

## 2020-10-05 ENCOUNTER — Ambulatory Visit (INDEPENDENT_AMBULATORY_CARE_PROVIDER_SITE_OTHER): Payer: Medicare Other

## 2020-10-05 DIAGNOSIS — J309 Allergic rhinitis, unspecified: Secondary | ICD-10-CM

## 2020-11-02 ENCOUNTER — Ambulatory Visit (INDEPENDENT_AMBULATORY_CARE_PROVIDER_SITE_OTHER): Payer: Medicare Other | Admitting: *Deleted

## 2020-11-02 DIAGNOSIS — J309 Allergic rhinitis, unspecified: Secondary | ICD-10-CM

## 2020-11-30 ENCOUNTER — Ambulatory Visit (INDEPENDENT_AMBULATORY_CARE_PROVIDER_SITE_OTHER): Payer: Medicare Other

## 2020-11-30 DIAGNOSIS — J309 Allergic rhinitis, unspecified: Secondary | ICD-10-CM

## 2020-12-18 ENCOUNTER — Other Ambulatory Visit: Payer: Self-pay

## 2020-12-18 ENCOUNTER — Ambulatory Visit
Admission: EM | Admit: 2020-12-18 | Discharge: 2020-12-18 | Disposition: A | Discharge status: Home / Self Care | Payer: Medicare Other | Attending: Urgent Care | Admitting: Urgent Care

## 2020-12-18 ENCOUNTER — Encounter: Payer: Self-pay | Admitting: Emergency Medicine

## 2020-12-18 ENCOUNTER — Ambulatory Visit: Payer: Medicare Other

## 2020-12-18 ENCOUNTER — Ambulatory Visit (INDEPENDENT_AMBULATORY_CARE_PROVIDER_SITE_OTHER): Payer: Medicare Other

## 2020-12-18 DIAGNOSIS — Z20822 Contact with and (suspected) exposure to covid-19: Secondary | ICD-10-CM

## 2020-12-18 DIAGNOSIS — R059 Cough, unspecified: Secondary | ICD-10-CM | POA: Diagnosis not present

## 2020-12-18 DIAGNOSIS — J069 Acute upper respiratory infection, unspecified: Secondary | ICD-10-CM | POA: Diagnosis not present

## 2020-12-18 DIAGNOSIS — E119 Type 2 diabetes mellitus without complications: Secondary | ICD-10-CM

## 2020-12-18 DIAGNOSIS — Z794 Long term (current) use of insulin: Secondary | ICD-10-CM

## 2020-12-18 DIAGNOSIS — Z7722 Contact with and (suspected) exposure to environmental tobacco smoke (acute) (chronic): Secondary | ICD-10-CM

## 2020-12-18 DIAGNOSIS — R490 Dysphonia: Secondary | ICD-10-CM

## 2020-12-18 DIAGNOSIS — R6883 Chills (without fever): Secondary | ICD-10-CM

## 2020-12-18 MED ORDER — ALBUTEROL SULFATE HFA 108 (90 BASE) MCG/ACT IN AERS: 1.0000 | INHALATION_SPRAY | Freq: Four times a day (QID) | RESPIRATORY_TRACT | 0 refills | Status: DC | PRN

## 2020-12-18 MED ORDER — BENZONATATE 100 MG PO CAPS: 100.0000 mg | ORAL_CAPSULE | Freq: Three times a day (TID) | ORAL | 0 refills | Status: DC | PRN

## 2020-12-18 MED ORDER — PROMETHAZINE-DM 6.25-15 MG/5ML PO SYRP: 5.0000 mL | ORAL_SOLUTION | Freq: Every evening | ORAL | 0 refills | Status: DC | PRN

## 2020-12-18 NOTE — ED Provider Notes (Signed)
[facility]   MRN: [id number] DOB: 10/31/1945  Subjective:   Karen Horn is a 75 y.o. female presenting for 4-day history of acute onset runny and stuffy nose, postnasal drainage, hoarseness, loss of her voice, coughing.  Patient has also had chills.  Did an at-home COVID test and was negative.  They are not opposed to a recheck including flu test.  Has a history of bronchitis.  No history of asthma.  Patient is not a smoker.  No current facility-administered medications for this encounter.  Current Outpatient Medications:    ALPRAZolam (XANAX) 0.5 MG tablet, Take 0.5 mg by mouth at bedtime as needed for anxiety., Disp: , Rfl:    aspirin 81 MG chewable tablet, Chew 81 mg by mouth daily., Disp: , Rfl:    Cholecalciferol (VITAMIN D3) 50 MCG (2000 UT) TABS, Take 1 tablet by mouth in the morning and at bedtime., Disp: , Rfl:    estradiol (ESTRACE) 0.1 MG/GM vaginal cream, Place 1 Applicatorful vaginally at bedtime., Disp: , Rfl:    glipiZIDE (GLUCOTROL) 10 MG tablet, Take 10 mg by mouth 2 (two) times daily before a meal., Disp: , Rfl:    Insulin Degludec (TRESIBA [place]), Inject 10 mLs into the skin at bedtime., Disp: , Rfl:    levothyroxine (SYNTHROID) 88 MCG tablet, Take 88 mcg by mouth daily before breakfast., Disp: , Rfl:    omeprazole (PRILOSEC) 20 MG capsule, Take 20 mg by mouth daily., Disp: , Rfl:    rOPINIRole (REQUIP) 0.25 MG tablet, Take 0.25 mg by mouth daily., Disp: , Rfl:    valACYclovir (VALTREX) 500 MG tablet, Take 500 mg by mouth daily., Disp: , Rfl:    Allergies  Allergen Reactions   Sulfa Antibiotics Rash    History reviewed. No pertinent past medical history.   Past Surgical History:  Procedure Laterality Date   ADENOIDECTOMY     SINOSCOPY     TONSILLECTOMY      History reviewed. No pertinent family history.  Social History   Tobacco Use   Smoking status: Never   Smokeless tobacco: Never  Vaping Use   Vaping Use: Never used  Substance  Use Topics   Alcohol use: Not Currently   Drug use: Never    ROS   Objective:   Vitals: BP 117/67 (BP Location: Right Arm)   Pulse 80   Temp 98 F (36.7 C) (Oral)   Resp 18   SpO2 93%   Physical Exam Constitutional:      General: She is not in acute distress.    Appearance: Normal appearance. She is well-developed. She is not ill-appearing, toxic-appearing or diaphoretic.  HENT:     Head: Normocephalic and atraumatic.     Right Ear: Tympanic membrane, ear canal and external ear normal. No drainage or tenderness. No middle ear effusion. Tympanic membrane is not erythematous.     Left Ear: Tympanic membrane, ear canal and external ear normal. No drainage or tenderness.  No middle ear effusion. Tympanic membrane is not erythematous.     Nose: Nose normal. No congestion or rhinorrhea.     Mouth/Throat:     Mouth: Mucous membranes are moist. No oral lesions.     Pharynx: Oropharynx is clear. No pharyngeal swelling, oropharyngeal exudate, posterior oropharyngeal erythema or uvula swelling.     Tonsils: No tonsillar exudate or tonsillar abscesses.  Eyes:     General: No scleral icterus.       Right eye: No discharge.  Left eye: No discharge.     Extraocular Movements: Extraocular movements intact.     Right eye: Normal extraocular motion.     Left eye: Normal extraocular motion.     Conjunctiva/sclera: Conjunctivae normal.     Pupils: Pupils are equal, round, and reactive to light.  Cardiovascular:     Rate and Rhythm: Normal rate and regular rhythm.     Pulses: Normal pulses.     Heart sounds: Normal heart sounds. No murmur heard.   No friction rub. No gallop.  Pulmonary:     Effort: Pulmonary effort is normal. No respiratory distress.     Breath sounds: Normal breath sounds. No stridor. No wheezing, rhonchi or rales.  Musculoskeletal:     Cervical back: Normal range of motion and neck supple.  Lymphadenopathy:     Cervical: No cervical adenopathy.  Skin:     General: Skin is warm and dry.     Findings: No rash.  Neurological:     General: No focal deficit present.     Mental Status: She is alert and oriented to person, place, and time.  Psychiatric:        Mood and Affect: Mood normal.        Behavior: Behavior normal.        Thought Content: Thought content normal.    DG Chest 2 View  Result Date: 12/18/2020 CLINICAL DATA:  Cough for 4 days. EXAM: CHEST - 2 VIEW COMPARISON:  None. FINDINGS: The cardiac silhouette, mediastinal and hilar contours are normal. Hyperinflation and mild emphysematous changes. No pulmonary infiltrates, pleural effusions or pulmonary lesions. The bony thorax is intact. IMPRESSION: Emphysematous changes but no acute pulmonary findings. Electronically Signed   By: Rudie Meyer M.D.   On: 12/18/2020 12:59     Assessment and Plan :   PDMP not reviewed this encounter.  1. Viral URI with cough   2. Exposure to COVID-19 virus   3. Hoarseness   4. Chills   5. Type 2 diabetes mellitus treated with insulin (HCC)   6. Exposure to second hand smoke    Respiratory panel pending. Will manage for viral illness such as viral URI, viral syndrome, viral rhinitis, COVID-19, influenza. Recommended supportive care.  Offered a lower dose steroid course to help.  Given her symptoms, chest x-ray over read but they declined as she has diabetes treated with insulin.  Offered scripts for symptomatic relief. Testing is pending.  Follow-up with PCP.  Counseled patient on potential for adverse effects with medications prescribed/recommended today, ER and return-to-clinic precautions discussed, patient verbalized understanding.     Wallis Bamberg, PA-C 12/18/20 1331

## 2020-12-18 NOTE — ED Triage Notes (Signed)
Cough x 4 days.  Lost voice.  Chills.  Home covid test was negative.

## 2020-12-19 ENCOUNTER — Telehealth: Payer: Self-pay

## 2020-12-19 LAB — COVID-19, FLU A+B NAA
Influenza A, NAA: NOT DETECTED
Influenza B, NAA: NOT DETECTED
SARS-CoV-2, NAA: NOT DETECTED

## 2020-12-19 MED ORDER — PROMETHAZINE-DM 6.25-15 MG/5ML PO SYRP: 5.0000 mL | ORAL_SOLUTION | Freq: Every evening | ORAL | 0 refills | Status: DC | PRN

## 2020-12-28 ENCOUNTER — Ambulatory Visit (INDEPENDENT_AMBULATORY_CARE_PROVIDER_SITE_OTHER): Payer: Medicare Other

## 2020-12-28 DIAGNOSIS — J309 Allergic rhinitis, unspecified: Secondary | ICD-10-CM

## 2021-01-18 ENCOUNTER — Other Ambulatory Visit (HOSPITAL_COMMUNITY): Payer: Self-pay | Admitting: Adult Health Nurse Practitioner

## 2021-01-18 DIAGNOSIS — Z1231 Encounter for screening mammogram for malignant neoplasm of breast: Secondary | ICD-10-CM

## 2021-01-25 ENCOUNTER — Ambulatory Visit (INDEPENDENT_AMBULATORY_CARE_PROVIDER_SITE_OTHER): Payer: Medicare Other

## 2021-01-25 DIAGNOSIS — J309 Allergic rhinitis, unspecified: Secondary | ICD-10-CM

## 2021-01-26 DIAGNOSIS — J302 Other seasonal allergic rhinitis: Secondary | ICD-10-CM | POA: Diagnosis not present

## 2021-01-26 NOTE — Progress Notes (Signed)
VIALS MADE. EXP 01-26-22 

## 2021-01-27 DIAGNOSIS — J3089 Other allergic rhinitis: Secondary | ICD-10-CM | POA: Diagnosis not present

## 2021-01-30 ENCOUNTER — Other Ambulatory Visit: Payer: Self-pay

## 2021-01-30 ENCOUNTER — Ambulatory Visit (HOSPITAL_COMMUNITY)
Admission: RE | Admit: 2021-01-30 | Discharge: 2021-01-30 | Disposition: A | Discharge status: Home / Self Care | Payer: Medicare Other | Source: Ambulatory Visit | Attending: Adult Health Nurse Practitioner | Admitting: Adult Health Nurse Practitioner

## 2021-01-30 DIAGNOSIS — Z1231 Encounter for screening mammogram for malignant neoplasm of breast: Secondary | ICD-10-CM | POA: Insufficient documentation

## 2021-02-02 ENCOUNTER — Other Ambulatory Visit (HOSPITAL_COMMUNITY): Payer: Self-pay | Admitting: Adult Health Nurse Practitioner

## 2021-02-02 DIAGNOSIS — M8589 Other specified disorders of bone density and structure, multiple sites: Secondary | ICD-10-CM

## 2021-02-15 ENCOUNTER — Other Ambulatory Visit: Payer: Self-pay

## 2021-02-15 ENCOUNTER — Ambulatory Visit (HOSPITAL_COMMUNITY)
Admission: RE | Admit: 2021-02-15 | Discharge: 2021-02-15 | Disposition: A | Discharge status: Home / Self Care | Payer: Medicare Other | Source: Ambulatory Visit | Attending: Adult Health Nurse Practitioner | Admitting: Adult Health Nurse Practitioner

## 2021-02-15 DIAGNOSIS — M8589 Other specified disorders of bone density and structure, multiple sites: Secondary | ICD-10-CM | POA: Diagnosis present

## 2021-02-24 ENCOUNTER — Ambulatory Visit (INDEPENDENT_AMBULATORY_CARE_PROVIDER_SITE_OTHER): Payer: Medicare Other

## 2021-02-24 DIAGNOSIS — J309 Allergic rhinitis, unspecified: Secondary | ICD-10-CM | POA: Diagnosis not present

## 2021-03-07 ENCOUNTER — Emergency Department (HOSPITAL_COMMUNITY)
Admission: EM | Admit: 2021-03-07 | Discharge: 2021-03-08 | Disposition: A | Discharge status: Home / Self Care | Payer: Medicare Other | Attending: Emergency Medicine | Admitting: Emergency Medicine

## 2021-03-07 DIAGNOSIS — Z7982 Long term (current) use of aspirin: Secondary | ICD-10-CM | POA: Diagnosis not present

## 2021-03-07 DIAGNOSIS — M79602 Pain in left arm: Secondary | ICD-10-CM | POA: Insufficient documentation

## 2021-03-07 DIAGNOSIS — S29019A Strain of muscle and tendon of unspecified wall of thorax, initial encounter: Secondary | ICD-10-CM

## 2021-03-07 DIAGNOSIS — R0602 Shortness of breath: Secondary | ICD-10-CM | POA: Insufficient documentation

## 2021-03-07 DIAGNOSIS — X58XXXA Exposure to other specified factors, initial encounter: Secondary | ICD-10-CM | POA: Diagnosis not present

## 2021-03-07 DIAGNOSIS — R0789 Other chest pain: Secondary | ICD-10-CM | POA: Diagnosis not present

## 2021-03-07 DIAGNOSIS — R079 Chest pain, unspecified: Secondary | ICD-10-CM | POA: Diagnosis present

## 2021-03-07 DIAGNOSIS — Z79899 Other long term (current) drug therapy: Secondary | ICD-10-CM | POA: Insufficient documentation

## 2021-03-07 DIAGNOSIS — M79601 Pain in right arm: Secondary | ICD-10-CM | POA: Insufficient documentation

## 2021-03-07 DIAGNOSIS — R110 Nausea: Secondary | ICD-10-CM | POA: Insufficient documentation

## 2021-03-07 DIAGNOSIS — I10 Essential (primary) hypertension: Secondary | ICD-10-CM | POA: Diagnosis not present

## 2021-03-07 HISTORY — DX: Type 2 diabetes mellitus without complications: E11.9

## 2021-03-07 HISTORY — DX: Disorder of thyroid, unspecified: E07.9

## 2021-03-08 ENCOUNTER — Emergency Department (HOSPITAL_COMMUNITY): Payer: Medicare Other

## 2021-03-08 ENCOUNTER — Other Ambulatory Visit: Payer: Self-pay

## 2021-03-08 ENCOUNTER — Encounter (HOSPITAL_COMMUNITY): Payer: Self-pay

## 2021-03-08 LAB — COMPREHENSIVE METABOLIC PANEL
ALT: 25 U/L (ref 0–44)
AST: 20 U/L (ref 15–41)
Albumin: 4.2 g/dL (ref 3.5–5.0)
Alkaline Phosphatase: 60 U/L (ref 38–126)
Anion gap: 7 (ref 5–15)
BUN: 18 mg/dL (ref 8–23)
CO2: 26 mmol/L (ref 22–32)
Calcium: 10.2 mg/dL (ref 8.9–10.3)
Chloride: 103 mmol/L (ref 98–111)
Creatinine, Ser: 0.8 mg/dL (ref 0.44–1.00)
GFR, Estimated: >60 mL/min (ref >60)
Glucose, Bld: 224 mg/dL — ABNORMAL HIGH (ref 70–99)
Potassium: 3.7 mmol/L (ref 3.5–5.1)
Sodium: 136 mmol/L (ref 135–145)
Total Bilirubin: 0.4 mg/dL (ref 0.3–1.2)
Total Protein: 7.3 g/dL (ref 6.5–8.1)

## 2021-03-08 LAB — CBC WITH DIFFERENTIAL/PLATELET
Abs Immature Granulocytes: 0.02 10*3/uL (ref 0.00–0.07)
Basophils Absolute: 0.1 10*3/uL (ref 0.0–0.1)
Basophils Relative: 1 %
Eosinophils Absolute: 0.1 10*3/uL (ref 0.0–0.5)
Eosinophils Relative: 2 %
HCT: 42.4 % (ref 36.0–46.0)
Hemoglobin: 14.3 g/dL (ref 12.0–15.0)
Immature Granulocytes: 0 %
Lymphocytes Relative: 33 %
Lymphs Abs: 2.5 10*3/uL (ref 0.7–4.0)
MCH: 30.4 pg (ref 26.0–34.0)
MCHC: 33.7 g/dL (ref 30.0–36.0)
MCV: 90.2 fL (ref 80.0–100.0)
Monocytes Absolute: 0.5 10*3/uL (ref 0.1–1.0)
Monocytes Relative: 6 %
Neutro Abs: 4.4 10*3/uL (ref 1.7–7.7)
Neutrophils Relative %: 58 %
Platelets: 250 10*3/uL (ref 150–400)
RBC: 4.7 MIL/uL (ref 3.87–5.11)
RDW: 13.1 % (ref 11.5–15.5)
WBC: 7.6 10*3/uL (ref 4.0–10.5)
nRBC: 0 % (ref 0.0–0.2)

## 2021-03-08 LAB — TROPONIN I (HIGH SENSITIVITY)
Troponin I (High Sensitivity): 3 ng/L (ref <18)
Troponin I (High Sensitivity): 3 ng/L (ref <18)

## 2021-03-08 LAB — I-STAT CHEM 8, ED
BUN: 17 mg/dL (ref 8–23)
Calcium, Ion: 1.3 mmol/L (ref 1.15–1.40)
Chloride: 102 mmol/L (ref 98–111)
Creatinine, Ser: 0.8 mg/dL (ref 0.44–1.00)
Glucose, Bld: 225 mg/dL — ABNORMAL HIGH (ref 70–99)
HCT: 43 % (ref 36.0–46.0)
Hemoglobin: 14.6 g/dL (ref 12.0–15.0)
Potassium: 3.8 mmol/L (ref 3.5–5.1)
Sodium: 138 mmol/L (ref 135–145)
TCO2: 27 mmol/L (ref 22–32)

## 2021-03-08 LAB — BRAIN NATRIURETIC PEPTIDE: B Natriuretic Peptide: 30 pg/mL (ref 0.0–100.0)

## 2021-03-08 MED ORDER — METHOCARBAMOL 500 MG PO TABS: 500.0000 mg | ORAL_TABLET | Freq: Two times a day (BID) | ORAL | 0 refills | Status: DC

## 2021-03-08 MED ORDER — IOHEXOL 350 MG/ML SOLN
100.0000 mL | Freq: Once | INTRAVENOUS | Status: AC | PRN
  Administered 2021-03-08: 100 mL via INTRAVENOUS

## 2021-03-08 MED ORDER — OXYCODONE-ACETAMINOPHEN 5-325 MG PO TABS: 0.5000 | ORAL_TABLET | ORAL | 0 refills | Status: DC | PRN

## 2021-03-08 NOTE — ED Triage Notes (Signed)
Pov from home with cc of chest pain and arm pain that started 03/06/2021. Feels like sharp stabbing pain.  Says that her bp was 197/107.

## 2021-03-08 NOTE — ED Provider Notes (Signed)
[facility] EMERGENCY DEPARTMENT Provider Note   CSN: [id number] Arrival date & time: 03/07/21  2349     History  Chief Complaint  Patient presents with   Chest Pain    Karen Horn is a 76 y.o. female.  Patient presents to the emergency department for evaluation of chest pain.  Patient initially started having pain between her shoulder blades yesterday that was present most of the day.  At some point today the pain started to radiate into the chest and now is in the central chest and radiates to the right arm and a small amount to the left arm.  Patient indicates that she feels mildly short of breath.  She got a little nauseated when the pain developed in the chest.      Home Medications Prior to Admission medications   Medication Sig Start Date End Date Taking? Authorizing Provider  methocarbamol (ROBAXIN) 500 MG tablet Take 1 tablet (500 mg total) by mouth 2 (two) times daily. 03/08/21  Yes Rhyleigh Grassel, Gwenyth Allegra, MD  oxyCODONE-acetaminophen (PERCOCET) 5-325 MG tablet Take 0.5 tablets by mouth every 4 (four) hours as needed for moderate pain. 03/08/21  Yes Phila Shoaf, Gwenyth Allegra, MD  albuterol (VENTOLIN HFA) 108 (90 Base) MCG/ACT inhaler Inhale 1-2 puffs into the lungs every 6 (six) hours as needed for wheezing or shortness of breath. 12/18/20   Jaynee Eagles, PA-C  ALPRAZolam Duanne Moron) 0.5 MG tablet Take 0.5 mg by mouth at bedtime as needed for anxiety.    [provider]  aspirin 81 MG chewable tablet Chew 81 mg by mouth daily.    [provider]  benzonatate (TESSALON) 100 MG capsule Take 1-2 capsules (100-200 mg total) by mouth 3 (three) times daily as needed for cough. 12/18/20   Jaynee Eagles, PA-C  Cholecalciferol (VITAMIN D3) 50 MCG (2000 UT) TABS Take 1 tablet by mouth in the morning and at bedtime.    [provider]  estradiol (ESTRACE) 0.1 MG/GM vaginal cream Place 1 Applicatorful vaginally at bedtime.    [provider]  glipiZIDE  (GLUCOTROL) 10 MG tablet Take 10 mg by mouth 2 (two) times daily before a meal.    [provider]  Insulin Degludec (TRESIBA [place]) Inject 10 mLs into the skin at bedtime.    [provider]  levothyroxine (SYNTHROID) 88 MCG tablet Take 88 mcg by mouth daily before breakfast.    [provider]  omeprazole (PRILOSEC) 20 MG capsule Take 20 mg by mouth daily.    [provider]  promethazine-dextromethorphan (PROMETHAZINE-DM) 6.25-15 MG/5ML syrup Take 5 mLs by mouth at bedtime as needed for cough. 12/19/20   Volney American, PA-C  rOPINIRole (REQUIP) 0.25 MG tablet Take 0.25 mg by mouth daily.    [provider]  valACYclovir (VALTREX) 500 MG tablet Take 500 mg by mouth daily.    [provider]      Allergies    Sulfa antibiotics    Review of Systems   Review of Systems  Cardiovascular:  Positive for chest pain.  Musculoskeletal:  Positive for back pain.   Physical Exam Updated Vital Signs BP (!) 159/62    Pulse 67    Temp 98 F (36.7 C)    Resp 17    Ht 5' (1.524 m)    Wt 52.2 kg    SpO2 97%    BMI 22.46 kg/m  Physical Exam Vitals and nursing note reviewed.  Constitutional:      General: She is not  in acute distress.    Appearance: Normal appearance. She is well-developed.  HENT:     Head: Normocephalic and atraumatic.     Right Ear: Hearing normal.     Left Ear: Hearing normal.     Nose: Nose normal.  Eyes:     Conjunctiva/sclera: Conjunctivae normal.     Pupils: Pupils are equal, round, and reactive to light.  Cardiovascular:     Rate and Rhythm: Regular rhythm.     Heart sounds: S1 normal and S2 normal. No murmur heard.   No friction rub. No gallop.  Pulmonary:     Effort: Pulmonary effort is normal. No respiratory distress.     Breath sounds: Normal breath sounds.  Chest:     Chest wall: No tenderness.  Abdominal:     General: Bowel sounds are normal.     Palpations: Abdomen is soft.     Tenderness: There is  no abdominal tenderness. There is no guarding or rebound. Negative signs include Murphy's sign and McBurney's sign.     Hernia: No hernia is present.  Musculoskeletal:        General: Normal range of motion.     Cervical back: Normal range of motion and neck supple.  Skin:    General: Skin is warm and dry.     Findings: No rash.  Neurological:     Mental Status: She is alert and oriented to person, place, and time.     GCS: GCS eye subscore is 4. GCS verbal subscore is 5. GCS motor subscore is 6.     Cranial Nerves: No cranial nerve deficit.     Sensory: No sensory deficit.     Coordination: Coordination normal.  Psychiatric:        Speech: Speech normal.        Behavior: Behavior normal.        Thought Content: Thought content normal.    ED Results / Procedures / Treatments   Labs (all labs ordered are listed, but only abnormal results are displayed) Labs Reviewed  COMPREHENSIVE METABOLIC PANEL - Abnormal; Notable for the following components:      Result Value   Glucose, Bld 224 (*)    All other components within normal limits  I-STAT CHEM 8, ED - Abnormal; Notable for the following components:   Glucose, Bld 225 (*)    All other components within normal limits  CBC WITH DIFFERENTIAL/PLATELET  BRAIN NATRIURETIC PEPTIDE  TROPONIN I (HIGH SENSITIVITY)  TROPONIN I (HIGH SENSITIVITY)    EKG EKG Interpretation  Date/Time:  Wednesday March 08 2021 00:07:12 EST Ventricular Rate:  69 PR Interval:  134 QRS Duration: 100 QT Interval:  396 QTC Calculation: 425 R Axis:   64 Text Interpretation: Sinus rhythm Minimal ST depression, anterolateral leads No significant change since last tracing Confirmed by Orpah Greek [phone]) on 03/08/2021 12:23:16 AM  Radiology DG Chest Port 1 View  Result Date: 03/08/2021 CLINICAL DATA:  Chest and arm pain two days, initial encounter EXAM: PORTABLE CHEST 1 VIEW COMPARISON:  12/18/2020 FINDINGS: Check shadow is within normal limits.  The lungs are well aerated bilaterally. No focal infiltrate or effusion is seen. No bony abnormality is noted. IMPRESSION: No active disease. Electronically Signed   By: Inez Catalina M.D.   On: 03/08/2021 00:41   CT ANGIO CHEST/ABD/PEL FOR DISSECTION W &/OR WO CONTRAST  Result Date: 03/08/2021 CLINICAL DATA:  Chest and abdominal pain for 2 days, hypertension, initial encounter EXAM: CT ANGIOGRAPHY CHEST, ABDOMEN  AND PELVIS TECHNIQUE: Non-contrast CT of the chest was initially obtained. Multidetector CT imaging through the chest, abdomen and pelvis was performed using the standard protocol during bolus administration of intravenous contrast. Multiplanar reconstructed images and MIPs were obtained and reviewed to evaluate the vascular anatomy. RADIATION DOSE REDUCTION: This exam was performed according to the departmental dose-optimization program which includes automated exposure control, adjustment of the mA and/or kV according to patient size and/or use of iterative reconstruction technique. CONTRAST:  174mL OMNIPAQUE IOHEXOL 350 MG/ML SOLN COMPARISON:  Chest x-ray from earlier in the same day. FINDINGS: CTA CHEST FINDINGS Cardiovascular: Initial precontrast images demonstrate no hyperdense crescent to suggest acute aortic injury. Post-contrast images demonstrate no aneurysmal dilatation or dissection. No significant atherosclerotic calcifications are seen. No cardiac enlargement is noted. No coronary calcifications are seen. Pulmonary artery as visualized is within normal limits. Mediastinum/Nodes: Thoracic inlet is within normal limits. No sizable hilar or mediastinal adenopathy is noted. The esophagus is within normal limits. Lungs/Pleura: Lungs are well aerated bilaterally. No focal infiltrate or sizable effusion is noted. No parenchymal nodules are seen. Musculoskeletal: Degenerative changes of the thoracic spine are noted. No acute rib abnormality is seen. Review of the MIP images confirms the above  findings. CTA ABDOMEN AND PELVIS FINDINGS VASCULAR Aorta: Atherosclerotic calcifications are noted without aneurysmal dilatation or dissection. Celiac: Patent without evidence of aneurysm, dissection, vasculitis or significant stenosis. SMA: Patent without evidence of aneurysm, dissection, vasculitis or significant stenosis. Renals: Both renal arteries are patent without evidence of aneurysm, dissection, vasculitis, fibromuscular dysplasia or significant stenosis. IMA: Patent without evidence of aneurysm, dissection, vasculitis or significant stenosis. Inflow: Iliacs demonstrate atherosclerotic calcification without aneurysmal dilatation or dissection. Veins: No specific venous abnormality is noted. Review of the MIP images confirms the above findings. NON-VASCULAR Hepatobiliary: Fatty infiltration of the liver is noted. The gallbladder has been surgically removed. Pancreas: Unremarkable. No pancreatic ductal dilatation or surrounding inflammatory changes. Spleen: Normal in size without focal abnormality. Adrenals/Urinary Tract: Adrenal glands are within normal limits bilaterally. Kidneys demonstrate a normal enhancement pattern. No renal calculi or obstructive changes are seen. The bladder is well distended. Stomach/Bowel: Mild diverticular change of the colon is noted. No obstructive or inflammatory changes are seen. The appendix is not well visualized. No inflammatory changes are seen. Small bowel and stomach are within normal limits. Lymphatic: No significant lymphadenopathy is noted. Reproductive: Uterus appears of been surgically removed. No adnexal mass is noted. Other: No abdominal wall hernia or abnormality. No abdominopelvic ascites. Musculoskeletal: Degenerative changes of lumbar spine and hip joints are noted. No acute abnormality noted. Review of the MIP images confirms the above findings. IMPRESSION: No evidence of acute aortic dissection or injury. Fatty liver. Diverticulosis without diverticulitis.  Electronically Signed   By: Inez Catalina M.D.   On: 03/08/2021 01:34    Procedures Procedures    Medications Ordered in ED Medications  iohexol (OMNIPAQUE) 350 MG/ML injection 100 mL (100 mLs Intravenous Contrast Given 03/08/21 0110)    ED Course/ Medical Decision Making/ A&P                           Medical Decision Making Amount and/or Complexity of Data Reviewed Labs: ordered. Radiology: ordered.  Risk Prescription drug management.   Presents to the emergency department for evaluation of back and chest pain.  Symptoms began more than a day ago.  Initially she had intrascapular back pain but now the pain is into the chest.  Pain  is sharp and stabbing pain that occurs with movements of the torso as well as taking a breath.  Patient reports that her blood pressure was very elevated at home which is unusual.  Patient arrives with back pain and now chest pain.  With her elevated blood pressure, aortic dissection was considered.  Additionally, acute coronary syndrome and noncardiac back and chest pain were considered in the differential.  Patient is EKG without obvious ischemia or infarct.  Troponin negative x2.  This after continuous symptoms for more than a day.  Doubt cardiac etiology but will require further evaluation.  Does not require hospitalization for cardiac evaluation, is safe for outpatient reevaluation.  Patient did undergo CT angiography and there is no evidence of thoracic or abdominal aortic aneurysm or dissection.  Patient reexamined and is still experiencing pain with movement of the upper back.  This is most consistent with thoracic back pain.  We will treat with analgesia and prompt follow-up as outlined above.  Blood pressure is still mildly elevated.  We will have her follow-up with primary care for recheck in the office determine if she requires treatment.        Final Clinical Impression(s) / ED Diagnoses Final diagnoses:  Thoracic myofascial strain,  initial encounter  Hypertension, unspecified type  Atypical chest pain    Rx / DC Orders ED Discharge Orders          Ordered    Ambulatory referral to Cardiology        03/08/21 0402    methocarbamol (ROBAXIN) 500 MG tablet  2 times daily        03/08/21 0402    oxyCODONE-acetaminophen (PERCOCET) 5-325 MG tablet  Every 4 hours PRN        03/08/21 0404              Orpah Greek, MD 03/08/21 0405

## 2021-03-10 MED FILL — Oxycodone w/ Acetaminophen Tab 5-325 MG: ORAL | Qty: 6 | Status: AC

## 2021-03-14 ENCOUNTER — Other Ambulatory Visit: Payer: Self-pay

## 2021-03-14 ENCOUNTER — Ambulatory Visit: Payer: Medicare Other

## 2021-03-14 ENCOUNTER — Ambulatory Visit (INDEPENDENT_AMBULATORY_CARE_PROVIDER_SITE_OTHER): Payer: Medicare Other | Admitting: Orthopaedic Surgery

## 2021-03-14 ENCOUNTER — Encounter: Payer: Self-pay | Admitting: Orthopaedic Surgery

## 2021-03-14 VITALS — BP 166/75 | HR 62 | Ht 60.0 in | Wt 114.1 lb

## 2021-03-14 DIAGNOSIS — M25511 Pain in right shoulder: Secondary | ICD-10-CM | POA: Diagnosis not present

## 2021-03-14 DIAGNOSIS — G8929 Other chronic pain: Secondary | ICD-10-CM

## 2021-03-14 MED ORDER — OXYCODONE-ACETAMINOPHEN 5-325 MG PO TABS: ORAL_TABLET | ORAL | 0 refills | Status: DC

## 2021-03-14 NOTE — Progress Notes (Signed)
Subjective:    Patient ID: Karen Horn, female    DOB: May 23, 1945, 76 y.o.   MRN: [id number]  HPI She did some extra stretching for exercises about 3 weeks ago or so.  Then she lifted 40 pound bags of deer feed from one location to another.  She noticed pain in the right shoulder that has gotten worse and worse.  Her pain was in the scapular area and upper trapezius on the right.  She had swelling.  She developed shortness of breath and chest pain on 02-26-21.  She was seen in the ER and had extensive workup including for heart and chest with CT scans.  They were negative.  She has seen her primary care doctor since then.  Her chest and breathing are improved but her shoulder pain persists.  She has no numbness, no redness.  The scapular pain has slowly gone away.  She cannot raise her right arm over her shoulder, she has pain sleeping on the right shoulder.    She has no numbness, no swelling, no redness.  She has been on Percocet one-half of a 5/325 with only slight help.  She has tried heat, ice, tylenol with no help.  She is diabetic. She was given Naprosyn and Robaxin and still has pain.   Review of Systems  Constitutional:  Positive for activity change.  Respiratory:  Positive for shortness of breath and wheezing.   Cardiovascular:  Positive for chest pain and palpitations.  Musculoskeletal:  Positive for arthralgias and neck pain.  All other systems reviewed and are negative. For Review of Systems, all other systems reviewed and are negative.  The following is a summary of the past history medically, past history surgically, known current medicines, social history and family history.  This information is gathered electronically by the computer from prior information and documentation.  I review this each visit and have found including this information at this point in the chart is beneficial and informative.   Past Medical History:  Diagnosis Date   Diabetes mellitus  without complication (HCC)    Thyroid disease     Past Surgical History:  Procedure Laterality Date   ADENOIDECTOMY     SINOSCOPY     TONSILLECTOMY      Current Outpatient Medications on File Prior to Visit  Medication Sig Dispense Refill   albuterol (VENTOLIN HFA) 108 (90 Base) MCG/ACT inhaler Inhale 1-2 puffs into the lungs every 6 (six) hours as needed for wheezing or shortness of breath. 18 g 0   ALPRAZolam (XANAX) 0.5 MG tablet Take 0.5 mg by mouth at bedtime as needed for anxiety.     aspirin 81 MG chewable tablet Chew 81 mg by mouth daily.     benzonatate (TESSALON) 100 MG capsule Take 1-2 capsules (100-200 mg total) by mouth 3 (three) times daily as needed for cough. 60 capsule 0   Cholecalciferol (VITAMIN D3) 50 MCG (2000 UT) TABS Take 1 tablet by mouth in the morning and at bedtime.     estradiol (ESTRACE) 0.1 MG/GM vaginal cream Place 1 Applicatorful vaginally at bedtime.     glipiZIDE (GLUCOTROL) 10 MG tablet Take 10 mg by mouth 2 (two) times daily before a meal.     Insulin Degludec (TRESIBA [place]) Inject 10 mLs into the skin at bedtime.     levothyroxine (SYNTHROID) 88 MCG tablet Take 88 mcg by mouth daily before breakfast.     methocarbamol (ROBAXIN) 500 MG tablet Take 1 tablet (500 mg total)  by mouth 2 (two) times daily. 20 tablet 0   omeprazole (PRILOSEC) 20 MG capsule Take 20 mg by mouth daily.     promethazine-dextromethorphan (PROMETHAZINE-DM) 6.25-15 MG/5ML syrup Take 5 mLs by mouth at bedtime as needed for cough. 100 mL 0   rOPINIRole (REQUIP) 0.25 MG tablet Take 0.25 mg by mouth daily.     valACYclovir (VALTREX) 500 MG tablet Take 500 mg by mouth daily.     No current facility-administered medications on file prior to visit.    Social History   Socioeconomic History   Marital status: Married    Spouse name: Not on file   Number of children: Not on file   Years of education: Not on file   Highest education level: Not on file  Occupational History   Not on  file  Tobacco Use   Smoking status: Never   Smokeless tobacco: Never  Vaping Use   Vaping Use: Never used  Substance and Sexual Activity   Alcohol use: Not Currently   Drug use: Never   Sexual activity: Not on file  Other Topics Concern   Not on file  Social History Narrative   Not on file   Social Determinants of Health   Financial Resource Strain: Not on file  Food Insecurity: Not on file  Transportation Needs: Not on file  Physical Activity: Not on file  Stress: Not on file  Social Connections: Not on file  Intimate Partner Violence: Not on file    History reviewed. No pertinent family history.  BP (!) 166/75    Pulse 62    Ht 5' (1.524 m)    Wt 114 lb 2 oz (51.8 kg)    BMI 22.29 kg/m   Body mass index is 22.29 kg/m.     Objective:   Physical Exam Vitals and nursing note reviewed. Exam conducted with a chaperone present.  Constitutional:      Appearance: She is well-developed.  HENT:     Head: Normocephalic and atraumatic.  Eyes:     Conjunctiva/sclera: Conjunctivae normal.     Pupils: Pupils are equal, round, and reactive to light.  Cardiovascular:     Rate and Rhythm: Normal rate and regular rhythm.  Pulmonary:     Effort: Pulmonary effort is normal.  Abdominal:     Palpations: Abdomen is soft.  Musculoskeletal:       Arms:     Cervical back: Normal range of motion and neck supple.  Skin:    General: Skin is warm and dry.  Neurological:     Mental Status: She is alert and oriented to person, place, and time.     Cranial Nerves: No cranial nerve deficit.     Motor: No abnormal muscle tone.     Coordination: Coordination normal.     Deep Tendon Reflexes: Reflexes are normal and symmetric. Reflexes normal.  Psychiatric:        Behavior: Behavior normal.        Thought Content: Thought content normal.        Judgment: Judgment normal.   X-rays were done of the right shoulder, reported separately.       Assessment & Plan:   Encounter  Diagnosis  Name Primary?   Chronic right shoulder pain Yes   I am concerned about rotator cuff tear or muscle tear as her pain is so intense and has not relieved with rest, narcotics, medicine.  PROCEDURE NOTE:  The patient request injection, verbal consent was obtained.  The right shoulder was prepped appropriately after time out was performed.   Sterile technique was observed and injection of 1 cc of DepoMedrol 40mg  with several cc's of plain xylocaine. Anesthesia was provided by ethyl chloride and a 20-gauge needle was used to inject the shoulder area. A posterior approach was used.  The injection was tolerated well.  A band aid dressing was applied.  The patient was advised to apply ice later today and tomorrow to the injection sight as needed.  I have given pain medicine.  I have reviewed the [place] web site prior to prescribing narcotic medicine for this patient.    I will get a MRI of the right shoulder.  Return in two weeks or earlier.  Call if any problem.  Precautions discussed.  Electronically Signed Sanjuana Kava, MD 1/24/20233:20 PM

## 2021-03-22 ENCOUNTER — Ambulatory Visit (INDEPENDENT_AMBULATORY_CARE_PROVIDER_SITE_OTHER): Payer: Medicare Other

## 2021-03-22 DIAGNOSIS — J309 Allergic rhinitis, unspecified: Secondary | ICD-10-CM | POA: Diagnosis not present

## 2021-03-28 ENCOUNTER — Ambulatory Visit: Payer: Medicare Other | Admitting: Orthopaedic Surgery

## 2021-04-05 ENCOUNTER — Ambulatory Visit (HOSPITAL_COMMUNITY): Payer: Medicare Other

## 2021-04-06 ENCOUNTER — Ambulatory Visit: Payer: Medicare Other | Admitting: Orthopaedic Surgery

## 2021-04-19 ENCOUNTER — Ambulatory Visit (INDEPENDENT_AMBULATORY_CARE_PROVIDER_SITE_OTHER): Payer: Medicare Other

## 2021-04-19 DIAGNOSIS — J309 Allergic rhinitis, unspecified: Secondary | ICD-10-CM | POA: Diagnosis not present

## 2021-04-26 ENCOUNTER — Ambulatory Visit (INDEPENDENT_AMBULATORY_CARE_PROVIDER_SITE_OTHER): Payer: Medicare Other

## 2021-04-26 DIAGNOSIS — J309 Allergic rhinitis, unspecified: Secondary | ICD-10-CM

## 2021-05-03 ENCOUNTER — Ambulatory Visit (INDEPENDENT_AMBULATORY_CARE_PROVIDER_SITE_OTHER): Payer: Medicare Other | Admitting: *Deleted

## 2021-05-03 DIAGNOSIS — J309 Allergic rhinitis, unspecified: Secondary | ICD-10-CM

## 2021-05-10 ENCOUNTER — Ambulatory Visit (INDEPENDENT_AMBULATORY_CARE_PROVIDER_SITE_OTHER): Payer: Medicare Other

## 2021-05-10 DIAGNOSIS — J309 Allergic rhinitis, unspecified: Secondary | ICD-10-CM | POA: Diagnosis not present

## 2021-05-16 ENCOUNTER — Other Ambulatory Visit: Payer: Self-pay

## 2021-05-16 ENCOUNTER — Ambulatory Visit: Payer: Medicare Other | Admitting: Orthopaedic Surgery

## 2021-05-16 ENCOUNTER — Encounter: Payer: Self-pay | Admitting: Orthopaedic Surgery

## 2021-05-16 DIAGNOSIS — M65312 Trigger thumb, left thumb: Secondary | ICD-10-CM

## 2021-05-16 NOTE — Progress Notes (Signed)
My thumb is triggering ? ?She has triggering of the left nondominant thumb at the A1 pulley.  She has had a trigger finger of the right thumb in the past and is familiar with it.  She has no trauma, no swelling, no numbness.  It is getting worse. It has been present over a month. ? ?She has triggering of the left thumb at the A1 pulley.  She has normal NV status. ? ?Encounter Diagnosis  ?Name Primary?  ? Trigger thumb, left thumb Yes  ? ?Procedure note: ?PROCEDURE ? ?Trigger Finger Injection ? ?The left Thumb  has been locking at the A1 pulley.  The patient has been told about injection of the digit.  Surgical correction and excision of the A1 pulley will resolve the problem.  Ani injection in the digit should help but the results may be short lived.  The patient asked appropriate questions and understands the procedure.  The patient has elected for an injection at this time.  Verbal consent was obtained.  A timeout was taken to confirm the proper hand and digit. ? ?Medication ? 1 mL of DepoMedrol 40 mg ? 2 mL of 1% lidocaine plain ? ?Ethyl chloride for anesthesia ? ?Alcohol was used to prepare the skin along with ethyl chloride and then the injection was made at the A1 pulley there were no complications.  It was tolerated well. ? ?A Band-aid dressing was applied. ? ?Call if any problem or difficulty.  ? ?If it recurs, consider surgery. ? ?Electronically Signed ?Darreld Mclean, MD ?3/28/20238:14 AM ? ?

## 2021-05-19 ENCOUNTER — Ambulatory Visit (INDEPENDENT_AMBULATORY_CARE_PROVIDER_SITE_OTHER): Payer: Medicare Other

## 2021-05-19 DIAGNOSIS — J309 Allergic rhinitis, unspecified: Secondary | ICD-10-CM

## 2021-06-14 ENCOUNTER — Ambulatory Visit (INDEPENDENT_AMBULATORY_CARE_PROVIDER_SITE_OTHER): Payer: Medicare Other

## 2021-06-14 DIAGNOSIS — J309 Allergic rhinitis, unspecified: Secondary | ICD-10-CM

## 2021-06-15 ENCOUNTER — Encounter (INDEPENDENT_AMBULATORY_CARE_PROVIDER_SITE_OTHER): Payer: Self-pay | Admitting: *Deleted

## 2021-06-19 ENCOUNTER — Other Ambulatory Visit: Payer: Self-pay

## 2021-06-19 DIAGNOSIS — M816 Localized osteoporosis [Lequesne]: Secondary | ICD-10-CM

## 2021-06-27 ENCOUNTER — Telehealth: Payer: Self-pay | Admitting: Pharmacy Technician

## 2021-06-27 NOTE — Telephone Encounter (Addendum)
BIV completed Prior auth needed

## 2021-07-12 ENCOUNTER — Ambulatory Visit (INDEPENDENT_AMBULATORY_CARE_PROVIDER_SITE_OTHER): Payer: Medicare Other

## 2021-07-12 DIAGNOSIS — J309 Allergic rhinitis, unspecified: Secondary | ICD-10-CM

## 2021-08-07 DIAGNOSIS — J302 Other seasonal allergic rhinitis: Secondary | ICD-10-CM | POA: Diagnosis not present

## 2021-08-07 NOTE — Progress Notes (Signed)
EXP 08/08/22 

## 2021-08-08 DIAGNOSIS — J3089 Other allergic rhinitis: Secondary | ICD-10-CM | POA: Diagnosis not present

## 2021-08-14 ENCOUNTER — Encounter (INDEPENDENT_AMBULATORY_CARE_PROVIDER_SITE_OTHER): Payer: Self-pay | Admitting: Gastroenterology

## 2021-08-14 ENCOUNTER — Telehealth (INDEPENDENT_AMBULATORY_CARE_PROVIDER_SITE_OTHER): Payer: Self-pay

## 2021-08-14 ENCOUNTER — Ambulatory Visit (INDEPENDENT_AMBULATORY_CARE_PROVIDER_SITE_OTHER): Payer: Medicare Other | Admitting: Gastroenterology

## 2021-08-14 DIAGNOSIS — K589 Irritable bowel syndrome without diarrhea: Secondary | ICD-10-CM | POA: Insufficient documentation

## 2021-08-14 DIAGNOSIS — K580 Irritable bowel syndrome with diarrhea: Secondary | ICD-10-CM | POA: Insufficient documentation

## 2021-08-14 MED ORDER — RIFAXIMIN 550 MG PO TABS: 550.0000 mg | ORAL_TABLET | Freq: Three times a day (TID) | ORAL | 0 refills | Status: DC

## 2021-08-14 NOTE — H&P (View-Only) (Signed)
Karen Horn, M.D. Gastroenterology & Hepatology [facility] [address] [place], [place] [address] Primary Care Physician: Lauretta Grill, FNP [address] [place] [address]  Referring MD: PCP  Chief Complaint: Abdominal bloating, nausea and vomiting  History of Present Illness: Karen Horn is a 76 y.o. female with Pmh DM and hypothyroidism, who presents for evaluation of abdominal bloating, nausea and vomiting.  Patient reports that close to 6 months ago she has presented recurrent episodes of bloating in her upper abdomen. She states that she has presented recurrent episodes of bloating that cause significant abdominal distention, to the point she has to unbutton her pants due to the discomfort. There is no correlation of the bloating with food intake or time of the day, as she can have bloating even in the morning. She feels very nauseated when this happens and has vomited some times in the past. She also endorses having changes in bowel movement between constipation and diarrhea,but mostly diarrhea which is quite frequent. Has gained some weight, close to 5 lb as she has been less active. She has been taking doxycycline for a recent diagnosis of bronchitis, but has not been taking it chronically. The patient denies having any fever, chills, hematochezia, melena, hematemesis, jaundice, pruritus or weight loss.  She was prescribed omeprazole 20 mg qday or her bloating, she believes it has somewhat helped relieve the symptoms but she is not currently taking it anymore as she ran out of the prescription.  She states that in 2016 Dr. Traci Horn at [facility] performed a breath test and she was told she had SIBO. Her metformin was stopped and she was given some antibiotic that completely resolved her symptoms.  Had normal 5-HIAA in 2021 - 2.9 as there was some concern for carcinoid by one of her former  gastroenterologist.  Last SWN:IOEVO Last Colonoscopy:2015 - hemorrhoids at [facility]  FHx: neg for any gastrointestinal/liver disease, mother bladder and thyroid cancer Social: neg smoking, alcohol or illicit drug use Surgical: c-section x2, hysterectomy appendectomy  Past Medical History: Past Medical History:  Diagnosis Date   Diabetes mellitus without complication (HCC)    Thyroid disease     Past Surgical History: Past Surgical History:  Procedure Laterality Date   ADENOIDECTOMY     SINOSCOPY     TONSILLECTOMY      Family History:History reviewed. No pertinent family history.  Social History: Social History   Tobacco Use  Smoking Status Never  Smokeless Tobacco Never   Social History   Substance and Sexual Activity  Alcohol Use Not Currently   Social History   Substance and Sexual Activity  Drug Use Never    Allergies: Allergies  Allergen Reactions   Sulfa Antibiotics Rash    Medications: Current Outpatient Medications  Medication Sig Dispense Refill   ALPRAZolam (XANAX) 0.5 MG tablet Take 0.5 mg by mouth at bedtime as needed for anxiety.     aspirin 81 MG chewable tablet Chew 81 mg by mouth daily.     Cholecalciferol (VITAMIN D3) 50 MCG (2000 UT) TABS Take 1 tablet by mouth in the morning and at bedtime.     doxycycline (VIBRA-TABS) 100 MG tablet Take 100 mg by mouth 2 (two) times daily.     glipiZIDE (GLUCOTROL) 10 MG tablet Take 10 mg by mouth 2 (two) times daily before a meal.     Insulin Degludec (TRESIBA [place]) Inject 22 mLs into the skin at bedtime.  levothyroxine (SYNTHROID) 88 MCG tablet Take 88 mcg by mouth daily before breakfast.     rOPINIRole (REQUIP) 0.25 MG tablet Take 0.25 mg by mouth daily.     omeprazole (PRILOSEC) 20 MG capsule Take 20 mg by mouth daily. (Patient not taking: Reported on 08/14/2021)     No current facility-administered medications for this visit.    Review of Systems: GENERAL: negative for malaise, night  sweats HEENT: No changes in hearing or vision, no nose bleeds or other nasal problems. NECK: Negative for lumps, goiter, pain and significant neck swelling RESPIRATORY: Negative for cough, wheezing CARDIOVASCULAR: Negative for chest pain, leg swelling, palpitations, orthopnea GI: SEE HPI MUSCULOSKELETAL: Negative for joint pain or swelling, back pain, and muscle pain. SKIN: Negative for lesions, rash PSYCH: Negative for sleep disturbance, mood disorder and recent psychosocial stressors. HEMATOLOGY Negative for prolonged bleeding, bruising easily, and swollen nodes. ENDOCRINE: Negative for cold or heat intolerance, polyuria, polydipsia and goiter. NEURO: negative for tremor, gait imbalance, syncope and seizures. The remainder of the review of systems is noncontributory.   Physical Exam: BP 128/76 (BP Location: Left Arm, Patient Position: Sitting, Cuff Size: Small)   Pulse 75   Temp 97.8 F (36.6 C) (Oral)   Ht 5' (1.524 m)   Wt 115 lb 11.2 oz (52.5 kg)   BMI 22.60 kg/m  GENERAL: The patient is AO x3, in no acute distress. HEENT: Head is normocephalic and atraumatic. EOMI are intact. Mouth is well hydrated and without lesions. NECK: Supple. No masses LUNGS: Clear to auscultation. No presence of rhonchi/wheezing/rales. Adequate chest expansion HEART: RRR, normal s1 and s2. ABDOMEN: Soft, nontender, no guarding, no peritoneal signs, and nondistended. BS +. No masses. EXTREMITIES: Without any cyanosis, clubbing, rash, lesions or edema. NEUROLOGIC: AOx3, no focal motor deficit. SKIN: no jaundice, no rashes   Imaging/Labs: as above  I personally reviewed and interpreted the available labs, imaging and endoscopic files.  Impression and Plan: Karen Horn is a 76 y.o. female with Pmh DM and hypothyroidism, who presents for evaluation of abdominal bloating, nausea and vomiting.  The patient has presented recurrent episodes of bloating and intermittent episodes of diarrhea, but no  presence of red flag signs.  This has become more frequent recently and is causing her significant distress.  Given her chronic history, I consider it is very likely she has IBS-D as the reason driving her symptoms.  Due to this, we will discuss the possibility of treating this with a 2-week course of Xifaxan for IBS-D which she is agreeable to try for now.  I advised her to restart her probiotics after finishing her antibiotic course.  Ultimately, if she fails to have any improvement of her symptoms, we will need to proceed with breath test to rule out SIMO.  Omeprazole would not be prescribed at this moment as she will be on antibiotics for now.  - Start Xifaxan 550 mg TID -If persistent symptoms despite taking Xifaxan, will need to perform SIMO breath test  All questions were answered.      Karen Blazing, MD Gastroenterology and Hepatology [facility]

## 2021-08-15 NOTE — Telephone Encounter (Signed)
I called and spoke with the patient she says no she can not afford the co pay. She is aware we have given samples and she will pick these up from the office today 08/15/2021.

## 2021-08-16 ENCOUNTER — Encounter: Payer: Self-pay | Admitting: Family

## 2021-08-16 ENCOUNTER — Ambulatory Visit: Payer: Medicare Other | Admitting: Family

## 2021-08-16 ENCOUNTER — Ambulatory Visit: Payer: Self-pay

## 2021-08-16 VITALS — BP 140/66 | HR 72 | Temp 98.2°F | Resp 18 | Ht 59.25 in | Wt 113.5 lb

## 2021-08-16 DIAGNOSIS — J302 Other seasonal allergic rhinitis: Secondary | ICD-10-CM

## 2021-08-16 DIAGNOSIS — J309 Allergic rhinitis, unspecified: Secondary | ICD-10-CM | POA: Diagnosis not present

## 2021-08-16 MED ORDER — EPINEPHRINE 0.3 MG/0.3ML IJ SOAJ: INTRAMUSCULAR | 1 refills | Status: AC

## 2021-08-16 NOTE — Patient Instructions (Addendum)
1. Seasonal and perennial allergic rhinitis - Continue with allergy shots at the same schedule for now - EpiPen refill sent. Reviewed proper technique - Continue with your antihistamine prior to allergy shots.  2. Schedule an appointment for skin testing to environmental allergens in 5-6 months to see if you are able to stop allergy injections

## 2021-08-16 NOTE — Progress Notes (Signed)
Karen, Horn [id number] Dept: [phone]  FOLLOW UP NOTE  Patient ID: Karen Horn, female    DOB: 1945-07-08  Age: 76 y.o. MRN: [id number] Date of Office Visit: 08/16/2021  Assessment  Chief Complaint: Allergic Rhinitis  (Still on allergy injections)  HPI Karen Horn is a 76 year old female who presents today for follow-up of seasonal and perennial allergic rhinitis.  She was last seen on April 27, 2020 by Dr. Ernst Bowler.  She reports that she is currently on an antibiotic (doxycycline) for bronchitis and sinus infection.  Also, she is on  another antibiotic from GI due to "possible bad bacteria".  She reports that she has been having bloating and abdominal pain.  She does not know the name of the antibiotic she is on from GI.  She was given samples due to the cost of the antibiotic.  Seasonal and perennial allergic rhinitis: She reports rhinorrhea that occurs not that often and postnasal drip that occurs more with ragweed and currently right now since having bronchitis.  She denies nasal congestion.  She does mention when she was diagnosed with bronchitis that she was told she also had a sinus infection.  She reports the cough with the bronchitis is getting better and she is no longer wheezing.  She does not feel like her symptoms are not bad enough right now to warrant nose sprays or a daily antihistamine. .  She continues with her allergy injections every 4 weeks.  She has been getting allergy injections for several years and is interested in stopping if possible.  She reports a little bit of swelling on one arm since starting allergy injections.  She does take an antihistamine before getting her allergy injections and lets the nurse see her arm before she leaves.  She reports that the reaction on her arm from her allergy injection never gets bigger.  She does not have an epinephrine autoinjector device.  She does feel like her allergy injections  help.   Drug Allergies:  Allergies  Allergen Reactions   Codeine Rash    Other reaction(s): Chest Pain   Gabapentin Other (See Comments)    Other reaction(s): Other Depression, suicidal thoughts   Sulfa Antibiotics Rash    Other reaction(s): Chest Pain   Atorvastatin Calcium Other (See Comments)    Other reaction(s): Other myalgia   Escitalopram Other (See Comments)    Other reaction(s): Other Headaches   Ezetimibe Other (See Comments)    Other reaction(s): Other Myalgia-patient wants to retry generic 10/17   Fluvastatin Other (See Comments)    Other reaction(s): Other myalgia   Fosamax [Alendronate]     Other reaction(s): Other Leg pain   Levofloxacin Other (See Comments)    Other reaction(s): Other Muscle aching in her shoulders, inner thighs and legs   Pramipexole Nausea Only and Other (See Comments)    Headache    Prednisone Other (See Comments)   Rosuvastatin Calcium Other (See Comments)    Other reaction(s): Other myalgia   Sertraline Other (See Comments)    Other reaction(s): Other Headaches   Simvastatin Other (See Comments)    Other reaction(s): Other myalgia   Trazodone And Nefazodone Nausea Only    Review of Systems: Review of Systems  Constitutional:  Negative for chills and fever.  HENT:         Reports rhinorrhea that occurs not that often and reports postnasal drip that occurs more with ragweed and since recently having bronchitis.  Denies nasal congestion.  Eyes:        Denies itchy watery eyes  Respiratory:  Positive for cough and wheezing. Negative for shortness of breath.        Reports cough that is getting better since diagnosis of bronchitis.  She also reports that she was wheezing with bronchitis, but this is no longer occurring.  Denies tightness in chest, shortness of breath, and nocturnal awakenings due to breathing problems.  Cardiovascular:  Negative for chest pain and palpitations.  Gastrointestinal:        Reports that she is  currently on samples of an antibiotic from GI to "treat possible bad bacteria due to her symptoms of bloating and abdominal pain.  Reports not much reflux and she thinks that the GI doctor is leaning towards a diagnosis of IBS.  Genitourinary:  Negative for frequency.       Reports since having bronchitis that when she coughs she will leak a little bit of urine.  Skin:  Negative for itching and rash.  Neurological:  Negative for headaches.  Endo/Heme/Allergies:  Positive for environmental allergies.     Physical Exam: BP 140/66   Pulse 72   Temp 98.2 F (36.8 C)   Resp 18   Ht 4' 11.25" (1.505 m)   Wt 113 lb 8 oz (51.5 kg)   SpO2 97%   BMI 22.73 kg/m    Physical Exam Constitutional:      Appearance: Normal appearance.  HENT:     Head: Normocephalic and atraumatic.     Comments: Pharynx mild cobblestoning present.  Eyes normal, ears normal, nose normal    Right Ear: Tympanic membrane, ear canal and external ear normal.     Left Ear: Tympanic membrane, ear canal and external ear normal.     Nose: Nose normal.     Mouth/Throat:     Mouth: Mucous membranes are moist.     Pharynx: Oropharynx is clear.  Eyes:     Conjunctiva/sclera: Conjunctivae normal.  Cardiovascular:     Rate and Rhythm: Normal rate and regular rhythm.  Pulmonary:     Effort: Pulmonary effort is normal.     Breath sounds: Normal breath sounds.     Comments: Lungs clear to auscultation Musculoskeletal:     Cervical back: Neck supple.  Skin:    General: Skin is warm.  Neurological:     Mental Status: She is alert and oriented to person, place, and time.  Psychiatric:        Mood and Affect: Mood normal.        Behavior: Behavior normal.        Thought Content: Thought content normal.        Judgment: Judgment normal.     Diagnostics:  None  Assessment and Plan: 1. Seasonal and perennial allergic rhinitis     Meds ordered this encounter  Medications   EPINEPHrine 0.3 mg/0.3 mL IJ SOAJ  injection    Sig: epinephrine 0.3 mg/0.3 mL injection, auto-injector    Dispense:  1 each    Refill:  1    Patient Instructions  1. Seasonal and perennial allergic rhinitis - Continue with allergy shots at the same schedule for now - EpiPen refill sent. Reviewed proper technique - Continue with your antihistamine prior to allergy shots.  2. Schedule an appointment for skin testing to environmental allergens in 5-6 months to see if you are able to stop allergy injections   Return in about 6 months (around 02/15/2022), or if symptoms worsen  or fail to improve, for skin testing.    Thank you for the opportunity to care for this patient.  Please do not hesitate to contact me with questions.  Nehemiah Settle, FNP [facility]

## 2021-08-17 ENCOUNTER — Encounter: Payer: Self-pay | Admitting: Pulmonary Disease

## 2021-08-21 ENCOUNTER — Telehealth (INDEPENDENT_AMBULATORY_CARE_PROVIDER_SITE_OTHER): Payer: Self-pay

## 2021-08-21 NOTE — Telephone Encounter (Signed)
Since starting Xifaxin she has felt nauseated and had increased acid reflux and some vomiting. Per Dr. Levon Hedger try to continue if possible. Patient made aware.

## 2021-08-21 NOTE — Telephone Encounter (Signed)
Thanks

## 2021-08-24 ENCOUNTER — Other Ambulatory Visit (INDEPENDENT_AMBULATORY_CARE_PROVIDER_SITE_OTHER): Payer: Self-pay

## 2021-08-24 ENCOUNTER — Other Ambulatory Visit (INDEPENDENT_AMBULATORY_CARE_PROVIDER_SITE_OTHER): Payer: Self-pay | Admitting: Gastroenterology

## 2021-08-24 ENCOUNTER — Telehealth (INDEPENDENT_AMBULATORY_CARE_PROVIDER_SITE_OTHER): Payer: Self-pay

## 2021-08-24 DIAGNOSIS — R140 Abdominal distension (gaseous): Secondary | ICD-10-CM

## 2021-08-24 DIAGNOSIS — K219 Gastro-esophageal reflux disease without esophagitis: Secondary | ICD-10-CM

## 2021-08-24 MED ORDER — OMEPRAZOLE 40 MG PO CPDR: 40.0000 mg | DELAYED_RELEASE_CAPSULE | Freq: Every day | ORAL | 3 refills | Status: DC

## 2021-08-24 NOTE — Telephone Encounter (Addendum)
Patient called back stating her husband thinks she should stay on the medication, so she wants to know if it is possible for you to call in something for acid reflux and the nausea. Patient says she had tried pantoprazole 20 mg once per day in the past and it helped some. If any medication is sent she would like it sent to [facility].   Patient called again today to report she can not continue with the Xifaxan, she has tried and it is causing her to have acid reflux, vomiting more,feels worse than she did before starting it and feels anxious. I advised to stop the medication since it was making her feel worse. I told her I would make you aware of this and ask if any other suggestions. Patient states understanding.

## 2021-08-24 NOTE — Telephone Encounter (Signed)
I spoke with the patient and she is aware that we have sent in omeprazole, and that the Dr. Also agreed with stopping xifaxan. Patient states she thinks she will stop the medication.

## 2021-08-24 NOTE — Telephone Encounter (Signed)
I sent omeprazole but if she prefers she can take two pantoprazole 20 mg pills instead. She should not take both omeprazole AND pantoprazole

## 2021-08-24 NOTE — Progress Notes (Signed)
error 

## 2021-08-24 NOTE — Telephone Encounter (Signed)
Will start her on omeprazole 40 mg qday, if persistent symptoms will need to proceed with EGD. Ultimately, if investigations are unremarkable, she will need to have SIBO breath test.  Agree with stopping Xifaxan

## 2021-08-30 ENCOUNTER — Telehealth: Payer: Self-pay | Admitting: Pharmacy Technician

## 2021-08-30 NOTE — Telephone Encounter (Addendum)
Auth Submission: DENIED Payer: [employer]HC Medication & CPT/J Code(s) submitted: Prolia (Denosumab) [id number] Route of submission (phone, fax, portal): PORTAL Auth type: Buy/Bill Units/visits requested: X1 DOSE Reference number: [id number] Approval from:  to  at [facility] as    Denied due to patient has not tried and or failed step therapy. Awaiting response from Roe Rutherford NP - [facility] Phone: [phone].

## 2021-09-05 ENCOUNTER — Other Ambulatory Visit (INDEPENDENT_AMBULATORY_CARE_PROVIDER_SITE_OTHER): Payer: Self-pay

## 2021-09-05 ENCOUNTER — Telehealth (INDEPENDENT_AMBULATORY_CARE_PROVIDER_SITE_OTHER): Payer: Self-pay

## 2021-09-05 DIAGNOSIS — R112 Nausea with vomiting, unspecified: Secondary | ICD-10-CM

## 2021-09-05 DIAGNOSIS — R140 Abdominal distension (gaseous): Secondary | ICD-10-CM

## 2021-09-05 NOTE — Telephone Encounter (Signed)
Thanks  Karen Horn,   Can you please schedule an EGD? Dx: bloating. Room: any  Thanks,  Katrinka Blazing, MD Gastroenterology and Hepatology [facility]

## 2021-09-05 NOTE — Telephone Encounter (Signed)
Patient called today stating she would like to proceed with setting up an EGD as she still is having reflux,bloating,vomiting clear liquids as per her last office visit, no better. Please advise.

## 2021-09-05 NOTE — Telephone Encounter (Signed)
Thanks

## 2021-09-06 ENCOUNTER — Ambulatory Visit (HOSPITAL_COMMUNITY)
Admission: RE | Admit: 2021-09-06 | Discharge: 2021-09-06 | Disposition: A | Discharge status: Home / Self Care | Payer: Medicare Other | Attending: Gastroenterology | Admitting: Gastroenterology

## 2021-09-06 ENCOUNTER — Encounter (HOSPITAL_COMMUNITY)
Admission: RE | Disposition: A | Discharge status: Home / Self Care | Payer: Self-pay | Source: Home / Self Care | Attending: Gastroenterology

## 2021-09-06 ENCOUNTER — Ambulatory Visit (HOSPITAL_COMMUNITY): Payer: Medicare Other | Admitting: Anesthesiology

## 2021-09-06 ENCOUNTER — Ambulatory Visit (HOSPITAL_BASED_OUTPATIENT_CLINIC_OR_DEPARTMENT_OTHER): Payer: Medicare Other | Admitting: Anesthesiology

## 2021-09-06 ENCOUNTER — Other Ambulatory Visit: Payer: Self-pay

## 2021-09-06 ENCOUNTER — Encounter (HOSPITAL_COMMUNITY): Payer: Self-pay | Admitting: Gastroenterology

## 2021-09-06 DIAGNOSIS — K449 Diaphragmatic hernia without obstruction or gangrene: Secondary | ICD-10-CM | POA: Insufficient documentation

## 2021-09-06 DIAGNOSIS — Z7984 Long term (current) use of oral hypoglycemic drugs: Secondary | ICD-10-CM | POA: Insufficient documentation

## 2021-09-06 DIAGNOSIS — K2950 Unspecified chronic gastritis without bleeding: Secondary | ICD-10-CM | POA: Insufficient documentation

## 2021-09-06 DIAGNOSIS — K589 Irritable bowel syndrome without diarrhea: Secondary | ICD-10-CM

## 2021-09-06 DIAGNOSIS — E119 Type 2 diabetes mellitus without complications: Secondary | ICD-10-CM | POA: Diagnosis not present

## 2021-09-06 DIAGNOSIS — K3189 Other diseases of stomach and duodenum: Secondary | ICD-10-CM | POA: Diagnosis not present

## 2021-09-06 DIAGNOSIS — Z794 Long term (current) use of insulin: Secondary | ICD-10-CM

## 2021-09-06 DIAGNOSIS — R140 Abdominal distension (gaseous): Secondary | ICD-10-CM | POA: Diagnosis not present

## 2021-09-06 DIAGNOSIS — R112 Nausea with vomiting, unspecified: Secondary | ICD-10-CM | POA: Diagnosis not present

## 2021-09-06 DIAGNOSIS — K219 Gastro-esophageal reflux disease without esophagitis: Secondary | ICD-10-CM | POA: Diagnosis not present

## 2021-09-06 DIAGNOSIS — E039 Hypothyroidism, unspecified: Secondary | ICD-10-CM | POA: Diagnosis not present

## 2021-09-06 HISTORY — PX: BIOPSY: SHX5522

## 2021-09-06 HISTORY — PX: ESOPHAGOGASTRODUODENOSCOPY (EGD) WITH PROPOFOL: SHX5813

## 2021-09-06 LAB — GLUCOSE, CAPILLARY
Glucose-Capillary: 257 mg/dL — ABNORMAL HIGH (ref 70–99)
Glucose-Capillary: 189 mg/dL — ABNORMAL HIGH (ref 70–99)

## 2021-09-06 SURGERY — ESOPHAGOGASTRODUODENOSCOPY (EGD) WITH PROPOFOL
Anesthesia: General

## 2021-09-06 MED ORDER — DEXTROSE 50 % IV SOLN
INTRAVENOUS | Status: AC
  Administered 2021-09-06: 50 mL via INTRAVENOUS
  Filled 2021-09-06: qty 50

## 2021-09-06 MED ORDER — LIDOCAINE HCL (CARDIAC) PF 100 MG/5ML IV SOSY
PREFILLED_SYRINGE | INTRAVENOUS | Status: DC | PRN
  Administered 2021-09-06: 40 mg via INTRAVENOUS

## 2021-09-06 MED ORDER — DEXTROSE 50 % IV SOLN: 50.0000 mL | Freq: Once | INTRAVENOUS | Status: AC

## 2021-09-06 MED ORDER — LACTATED RINGERS IV SOLN: INTRAVENOUS | Status: DC

## 2021-09-06 MED ORDER — PROPOFOL 10 MG/ML IV BOLUS
INTRAVENOUS | Status: DC | PRN
  Administered 2021-09-06: 30 mg via INTRAVENOUS
  Administered 2021-09-06: 80 mg via INTRAVENOUS
  Administered 2021-09-06 (×2): 30 mg via INTRAVENOUS

## 2021-09-06 MED ORDER — STERILE WATER FOR IRRIGATION IR SOLN
Status: DC | PRN
  Administered 2021-09-06: 120 mL

## 2021-09-06 NOTE — Interval H&P Note (Signed)
History and Physical Interval Note:  09/06/2021 10:16 AM  Karen Horn  has presented today for surgery, with the diagnosis of Bloating Nausea Vomiting.  The various methods of treatment have been discussed with the patient and family. After consideration of risks, benefits and other options for treatment, the patient has consented to  Procedure(s) with comments: ESOPHAGOGASTRODUODENOSCOPY (EGD) WITH PROPOFOL (N/A) - 215 ASA 2 as a surgical intervention.  The patient's history has been reviewed, patient examined, no change in status, stable for surgery.  I have reviewed the patient's chart and labs.  Questions were answered to the patient's satisfaction.     Katrinka Blazing Mayorga

## 2021-09-06 NOTE — Op Note (Signed)
[facility] Patient Name: Karen Horn Procedure Date: 09/06/2021 10:23 AM MRN: [id number] Date of Birth: Jul 27, 1945 Attending MD: Katrinka Blazing ,  CSN: [id number] Age: 76 Admit Type: Outpatient Procedure:                Upper GI endoscopy Indications:              Abdominal bloating, Nausea with vomiting Providers:                Katrinka Blazing, Angelica Ran, Pandora Leiter,                            Technician Referring MD:              Medicines:                Monitored Anesthesia Care Complications:            No immediate complications. Estimated Blood Loss:     Estimated blood loss: none. Procedure:                Pre-Anesthesia Assessment:                           - Prior to the procedure, a History and Physical                            was performed, and patient medications, allergies                            and sensitivities were reviewed. The patient's                            tolerance of previous anesthesia was reviewed.                           - The risks and benefits of the procedure and the                            sedation options and risks were discussed with the                            patient. All questions were answered and informed                            consent was obtained.                           - ASA Grade Assessment: II - A patient with mild                            systemic disease.                           After obtaining informed consent, the endoscope was                            passed under direct vision. Throughout the  procedure, the patient's blood pressure, pulse, and                            oxygen saturations were monitored continuously. The                            GIF-H190 ([id number]) scope was introduced through the                            mouth, and advanced to the second part of duodenum.                            The upper GI endoscopy was accomplished without                             difficulty. The patient tolerated the procedure                            well. Scope In: 10:51:47 AM Scope Out: 11:00:43 AM Total Procedure Duration: 0 hours 8 minutes 56 seconds  Findings:      A 3 cm hiatal hernia was present.      The gastroesophageal flap valve was visualized endoscopically and       classified as Hill Grade III (minimal fold, loose to endoscope, hiatal       hernia likely).      Striped mildly erythematous mucosa without bleeding was found in the       gastric antrum. Biopsies were taken with a cold forceps for Helicobacter       pylori testing.      The examined duodenum was normal. Biopsies were taken with a cold       forceps for histology. Impression:               - 3 cm hiatal hernia.                           - Erythematous mucosa in the antrum. Biopsied.                           - Normal examined duodenum. Biopsied. Moderate Sedation:      Per Anesthesia Care Recommendation:           - Discharge patient to home (ambulatory).                           - Resume previous diet.                           - Await pathology results.                           - Continue present medications.                           - Check H. pylori serology. Procedure Code(s):        --- Professional ---  45625, Esophagogastroduodenoscopy, flexible,                            transoral; with biopsy, single or multiple Diagnosis Code(s):        --- Professional ---                           K44.9, Diaphragmatic hernia without obstruction or                            gangrene                           K31.89, Other diseases of stomach and duodenum                           R14.0, Abdominal distension (gaseous)                           R11.2, Nausea with vomiting, unspecified CPT copyright 2019 American Medical Association. All rights reserved. The codes documented in this report are preliminary and upon coder review may  be  revised to meet current compliance requirements. Katrinka Blazing, MD Katrinka Blazing,  09/06/2021 11:07:58 AM This report has been signed electronically. Number of Addenda: 0

## 2021-09-06 NOTE — Anesthesia Preprocedure Evaluation (Signed)
Anesthesia Evaluation  Patient identified by MRN, date of birth, ID band Patient awake    Reviewed: Allergy & Precautions, NPO status , Patient's Chart, lab work & pertinent test results  Airway Mallampati: III  TM Distance: <3 FB Neck ROM: Full  Mouth opening: Limited Mouth Opening  Dental  (+) Dental Advisory Given, Implants   Pulmonary neg sleep apnea (snoring, negative sleep study),    Pulmonary exam normal breath sounds clear to auscultation       Cardiovascular negative cardio ROS Normal cardiovascular exam Rhythm:Regular Rate:Normal     Neuro/Psych TIAnegative psych ROS   GI/Hepatic Neg liver ROS, GERD  Medicated and Poorly Controlled,  Endo/Other  diabetes, Well Controlled, Type 2, Insulin Dependent, Oral Hypoglycemic AgentsHypothyroidism   Renal/GU negative Renal ROS  negative genitourinary   Musculoskeletal negative musculoskeletal ROS (+)   Abdominal   Peds negative pediatric ROS (+)  Hematology negative hematology ROS (+)   Anesthesia Other Findings   Reproductive/Obstetrics negative OB ROS                             Anesthesia Physical Anesthesia Plan  ASA: 3  Anesthesia Plan: General   Post-op Pain Management: Minimal or no pain anticipated   Induction: Intravenous  PONV Risk Score and Plan: Propofol infusion  Airway Management Planned: Natural Airway and Nasal Cannula  Additional Equipment:   Intra-op Plan:   Post-operative Plan:   Informed Consent: I have reviewed the patients History and Physical, chart, labs and discussed the procedure including the risks, benefits and alternatives for the proposed anesthesia with the patient or authorized representative who has indicated his/her understanding and acceptance.     Dental advisory given  Plan Discussed with: CRNA and Surgeon  Anesthesia Plan Comments:         Anesthesia Quick Evaluation

## 2021-09-06 NOTE — Transfer of Care (Signed)
Immediate Anesthesia Transfer of Care Note  Patient: Karen Horn  Procedure(s) Performed: ESOPHAGOGASTRODUODENOSCOPY (EGD) WITH PROPOFOL BIOPSY  Patient Location: Endoscopy Unit  Anesthesia Type:General  Level of Consciousness: drowsy  Airway & Oxygen Therapy: Patient Spontanous Breathing  Post-op Assessment: Report given to RN and Post -op Vital signs reviewed and stable  Post vital signs: Reviewed and stable  Last Vitals:  Vitals Value Taken Time  BP    Temp    Pulse 74   Resp 13   SpO2 97%     Last Pain:  Vitals:   09/06/21 1047  TempSrc:   PainSc: 0-No pain      Patients Stated Pain Goal: 8 (09/06/21 0941)  Complications: No notable events documented.

## 2021-09-06 NOTE — Anesthesia Postprocedure Evaluation (Signed)
Anesthesia Post Note  Patient: Karen Horn  Procedure(s) Performed: ESOPHAGOGASTRODUODENOSCOPY (EGD) WITH PROPOFOL BIOPSY  Patient location during evaluation: Phase II Anesthesia Type: General Level of consciousness: awake and alert and oriented Pain management: pain level controlled Vital Signs Assessment: post-procedure vital signs reviewed and stable Respiratory status: spontaneous breathing, nonlabored ventilation and respiratory function stable Cardiovascular status: blood pressure returned to baseline and stable Postop Assessment: no apparent nausea or vomiting Anesthetic complications: no   No notable events documented.   Last Vitals:  Vitals:   09/06/21 0950 09/06/21 1103  BP: (!) 150/61 (!) 107/50  Pulse: 72 73  Resp: 15 14  Temp: 36.6 C 36.4 C  SpO2: 97% 97%    Last Pain:  Vitals:   09/06/21 1111  TempSrc:   PainSc: 0-No pain                 Hisae Decoursey C Sofia Vanmeter

## 2021-09-06 NOTE — Discharge Instructions (Signed)
You are being discharged to home.  ?Resume your previous diet.  ?We are waiting for your pathology results.  ?Continue your present medications.  ?

## 2021-09-07 LAB — H. PYLORI ANTIBODY, IGG: H Pylori IgG: 0.19 Index Value (ref 0.00–0.79)

## 2021-09-08 LAB — SURGICAL PATHOLOGY

## 2021-09-11 ENCOUNTER — Other Ambulatory Visit (INDEPENDENT_AMBULATORY_CARE_PROVIDER_SITE_OTHER): Payer: Self-pay

## 2021-09-11 DIAGNOSIS — R140 Abdominal distension (gaseous): Secondary | ICD-10-CM

## 2021-09-11 DIAGNOSIS — R6881 Early satiety: Secondary | ICD-10-CM

## 2021-09-12 ENCOUNTER — Encounter (HOSPITAL_COMMUNITY): Payer: Self-pay | Admitting: Gastroenterology

## 2021-09-12 ENCOUNTER — Encounter (INDEPENDENT_AMBULATORY_CARE_PROVIDER_SITE_OTHER): Payer: Self-pay

## 2021-09-13 ENCOUNTER — Ambulatory Visit (INDEPENDENT_AMBULATORY_CARE_PROVIDER_SITE_OTHER): Payer: Medicare Other

## 2021-09-13 DIAGNOSIS — J309 Allergic rhinitis, unspecified: Secondary | ICD-10-CM

## 2021-09-14 ENCOUNTER — Encounter (HOSPITAL_COMMUNITY): Payer: Self-pay

## 2021-09-14 ENCOUNTER — Encounter (HOSPITAL_COMMUNITY)
Admission: RE | Admit: 2021-09-14 | Discharge: 2021-09-14 | Disposition: A | Discharge status: Home / Self Care | Payer: Medicare Other | Source: Ambulatory Visit | Attending: Gastroenterology | Admitting: Gastroenterology

## 2021-09-14 DIAGNOSIS — R6881 Early satiety: Secondary | ICD-10-CM | POA: Insufficient documentation

## 2021-09-14 DIAGNOSIS — R140 Abdominal distension (gaseous): Secondary | ICD-10-CM | POA: Diagnosis present

## 2021-09-14 MED ORDER — TECHNETIUM TC 99M SULFUR COLLOID
2.0000 | Freq: Once | INTRAVENOUS | Status: AC | PRN
  Administered 2021-09-14: 2.2 via ORAL

## 2021-09-18 NOTE — Telephone Encounter (Signed)
Appeal forms has been faxed to Roe Rutherford NP Phone: [phone] Fax; [phone] Will f/u once I have a response.

## 2021-09-18 NOTE — Telephone Encounter (Signed)
Auth Submission: APPROVED - (APPEAL/PEER TO PEER APPROVED) Payer: [employer] Medication & CPT/J Code(s) submitted: Prolia (Denosumab) [id number] Route of submission (phone, fax, portal): PORTAL Auth type: Buy/Bill Units/visits requested: 60MG  X 1 DOSE Reference number: Approval from: 09/17/21 to 02/18/22

## 2021-10-04 ENCOUNTER — Telehealth: Payer: Self-pay | Admitting: Pharmacy Technician

## 2021-10-11 ENCOUNTER — Ambulatory Visit (INDEPENDENT_AMBULATORY_CARE_PROVIDER_SITE_OTHER): Payer: Medicare Other

## 2021-10-11 DIAGNOSIS — J309 Allergic rhinitis, unspecified: Secondary | ICD-10-CM | POA: Diagnosis not present

## 2021-10-12 ENCOUNTER — Encounter: Payer: Self-pay | Admitting: Orthopedic Surgery

## 2021-10-12 ENCOUNTER — Ambulatory Visit (HOSPITAL_COMMUNITY): Payer: Medicare Other

## 2021-10-12 ENCOUNTER — Ambulatory Visit (INDEPENDENT_AMBULATORY_CARE_PROVIDER_SITE_OTHER): Payer: Medicare Other | Admitting: Orthopedic Surgery

## 2021-10-12 DIAGNOSIS — M65312 Trigger thumb, left thumb: Secondary | ICD-10-CM

## 2021-10-12 DIAGNOSIS — M029 Reactive arthropathy, unspecified: Secondary | ICD-10-CM | POA: Insufficient documentation

## 2021-10-12 MED ORDER — METHYLPREDNISOLONE ACETATE 40 MG/ML IJ SUSP
40.0000 mg | Freq: Once | INTRAMUSCULAR | Status: AC
  Administered 2021-10-12: 40 mg via INTRA_ARTICULAR

## 2021-10-12 NOTE — Progress Notes (Signed)
Chief Complaint  Patient presents with   Hand Problem    Recurrent left thumb triggering    76 yo F; complains of pain left thumb.  She has had pain over the A1 pulley of the left thumb with clicking locking for several weeks comes in for evaluation and management as the last injection was in March of this year  Review of systems pain in the right thumb at the metacarpal phalangeal joint dull aching no clicking no popping no locking.  Examination.  Karen Horn is awake alert and oriented her mood and affect are normal  Her left thumb is tender over the A1 pulley she has decreased range of motion of the IP joint color capillary refill are normal extensor and flexor tendons are intact  Diagnosis trigger thumb Encounter Diagnosis  Name Primary?   Trigger thumb, left thumb Yes   Plan she is amenable for injection Left Trigger thumb injection Medication  1 mL of 40 mg Depo-Medrol  2 mL of 1% lidocaine plain  Ethyl chloride for anesthesia  Verbal consent was obtained timeout was taken to confirm the injection site as left thumb  Alcohol was used to prepare the skin along with ethyl chloride and then the injection was made at the A1 pulley there were no complications  Return as needed

## 2021-10-12 NOTE — Patient Instructions (Signed)

## 2021-10-25 ENCOUNTER — Telehealth: Payer: Self-pay | Admitting: Pharmacy Technician

## 2021-10-25 NOTE — Telephone Encounter (Addendum)
New PA due to change in site = [place]  Auth Submission: APPROVED Payer: [employer]HC MEDICARE Medication & CPT/J Code(s) submitted: Prolia (Denosumab) [phone] Route of submission (phone, fax, portal): PORTAL Phone # Fax # Auth type: Buy/Bill Units/visits requested: X2 DOSES Reference number: [id number] Approval from: 10/25/21 to  10/26/22

## 2021-10-26 ENCOUNTER — Ambulatory Visit (INDEPENDENT_AMBULATORY_CARE_PROVIDER_SITE_OTHER): Payer: Medicare Other | Admitting: Allergy & Immunology

## 2021-10-26 ENCOUNTER — Encounter: Payer: Self-pay | Admitting: Allergy & Immunology

## 2021-10-26 ENCOUNTER — Encounter: Payer: Self-pay | Admitting: Pulmonary Disease

## 2021-10-26 DIAGNOSIS — J302 Other seasonal allergic rhinitis: Secondary | ICD-10-CM

## 2021-10-26 DIAGNOSIS — J3089 Other allergic rhinitis: Secondary | ICD-10-CM

## 2021-10-26 DIAGNOSIS — J0100 Acute maxillary sinusitis, unspecified: Secondary | ICD-10-CM | POA: Diagnosis not present

## 2021-10-26 MED ORDER — CEFDINIR 300 MG PO CAPS: 300.0000 mg | ORAL_CAPSULE | Freq: Two times a day (BID) | ORAL | 0 refills | Status: AC

## 2021-10-26 NOTE — Progress Notes (Signed)
RE: Karen Horn MRN: [id number] DOB: 25-Aug-1945 Date of Telemedicine Visit: 10/26/2021  Referring provider: Pablo Lawrence, NP Primary care provider: Pablo Lawrence, NP  Chief Complaint: Sinusitis   Telemedicine Follow Up Visit via Telephone: I connected with Karen Horn for a follow up on 10/26/21 by telephone and verified that I am speaking with the correct person using two identifiers.   I discussed the limitations, risks, security and privacy concerns of performing an evaluation and management service by telephone and the availability of in person appointments. I also discussed with the patient that there may be a patient responsible charge related to this service. The patient expressed understanding and agreed to proceed.  Patient is at home.  Provider is at the office.  Visit start time: 3:55 PM Visit end time: 4:30 PM Insurance consent/check in by: [place] consent and medical assistant/nurse: Verdis Frederickson  History of Present Illness:  She is a 76 y.o. female, who is being followed for seasonal and perennial allergic rhinitis. Her previous allergy office visit was in June 2023 with Karen Charon, FNP.  At that time, she was continued on allergy shots at the same schedule.  She also had her EpiPen refilled.  Was using antihistamines prior to injections.  There was discussion of doing allergy testing to see if she would be able to stop her allergy shots.  She had been on them for a number of years.  In the interim, she has mostly done well. She started having symptoms when she was in [place]. It started Thursday last week. It was cooler up there. She has been having worsening postnasal drip and drainage down her throat. She has been super nauseous. She has developed a bad cough. The cough is keeping her and her husband awake at night. She has not had a fever at all. She did not COVID test. She does have some at home.   **She did a COVID test at home and called [place]  back to let us know that it was negative.   She has been using allergy pills to help with this. She tried some Sudafed, but she reports that this was not helping much at all. She has generally gotten worse over the last 8 days. She has been rather tired with all of this.   Her last episode with antibiotics was last November 2022. She got doxycycline in 2022 and she did not tolerate it well with upset. She did have Augmentin in August 2023; this was something to do with her stomach. She tells me that this was for a "virus". She reports that this only made her feel worse.   [name] is on allergen immunotherapy. She receives two injections. Immunotherapy script #1 contains molds and dust mites. She currently receives 0.66m of the RED vial (1/100). Immunotherapy script #2 contains ragweed and grasses. She currently receives 0.547mof the RED vial (1/100). She is tolerating the shots without a problem. She has not had anything in the way of large local reactions and certainly no systemic reactions.   Otherwise, there have been no changes to her past medical history, surgical history, family history, or social history.  Assessment and Plan:  [name] a [age].o. female with:  Seasonal and perennial allergic rhinitis (grasses, molds, dust mite) - on allergen immunotherapy for around 15 years   Acute sinusitis - starting cefdinir today  COVID negative (home test done during the visit today)   [name] experiencing symptoms consistent with acute sinusitis.  She has had symptoms for 8 days now with a worsening course.  She has been using some over-the-counter medications without improvement.  Because of this, we are going to start an antibiotic.  She apparently recently had Augmentin to treat a stomach virus, which is rather odd.  Therefore, we are going to do cefdinir today instead.  If these infections continue, we might need to do an immune work-up.  Diagnostics: None.  Medication List:   Current Outpatient Medications  Medication Sig Dispense Refill   ACCU-CHEK AVIVA PLUS test strip 2 (two) times daily.     ALPRAZolam (XANAX) 0.5 MG tablet Take 0.25 mg by mouth daily as needed for anxiety.     aspirin 81 MG chewable tablet Chew 81 mg by mouth in the morning.     atorvastatin (LIPITOR) 10 MG tablet Take 10 mg by mouth every Monday, Wednesday, and Friday.     Blood Glucose Monitoring Suppl (ACCU-CHEK AVIVA PLUS) w/Device KIT by Does not apply route.     cholecalciferol (VITAMIN D) 25 MCG (1000 UNIT) tablet Take 2,000 Units by mouth in the morning.     EPINEPHrine 0.3 mg/0.3 mL IJ SOAJ injection epinephrine 0.3 mg/0.3 mL injection, auto-injector (Patient taking differently: Inject 0.3 mg into the muscle as needed for anaphylaxis.) 1 each 1   glipiZIDE (GLUCOTROL XL) 5 MG 24 hr tablet Take 10 mg by mouth daily with breakfast.     glucose blood (ACCU-CHEK AVIVA PLUS) test strip TEST SUGAR 3-4 TIMES A DAY     insulin degludec (TRESIBA FLEXTOUCH) 200 UNIT/ML FlexTouch Pen Inject 22 Units into the skin at bedtime.     Insulin Pen Needle (SURE COMFORT PEN NEEDLES) 31G X 5 MM MISC Sure Comfort Pen Needle 31 gauge x 5/16"     Lactobacillus (PROBIOTIC ACIDOPHILUS) CAPS Take 1 capsule by mouth in the morning.     levothyroxine (SYNTHROID) 75 MCG tablet Take 75 mcg by mouth daily before breakfast.     omeprazole (PRILOSEC) 40 MG capsule Take 1 capsule (40 mg total) by mouth daily. 90 capsule 3   rOPINIRole (REQUIP) 0.25 MG tablet Take 0.25 mg by mouth at bedtime.     sertraline (ZOLOFT) 25 MG tablet Take 25 mg by mouth at bedtime.     valACYclovir (VALTREX) 500 MG tablet Take 500 mg by mouth in the morning.     No current facility-administered medications for this visit.   Allergies: Allergies  Allergen Reactions   Codeine Rash    Other reaction(s): Chest Pain   Gabapentin Other (See Comments)    Other reaction(s): Other Depression, suicidal thoughts   Sulfa Antibiotics Rash and  Other (See Comments)    chest Pain   Atorvastatin Calcium Other (See Comments)    Other reaction(s): Other myalgia   Escitalopram Other (See Comments)    Other reaction(s): Other Headaches   Ezetimibe Other (See Comments)    Other reaction(s): Other Myalgia-patient wants to retry generic 10/17   Fluvastatin Other (See Comments)    Other reaction(s): Other myalgia   Fosamax [Alendronate]     Other reaction(s): Other Leg pain   Levofloxacin Other (See Comments)    Other reaction(s): Other Muscle aching in her shoulders, inner thighs and legs   Pramipexole Nausea Only and Other (See Comments)    Headache    Prednisone Other (See Comments)    Unknown reaction type   Rosuvastatin Calcium Other (See Comments)    Other reaction(s): Other myalgia   Sertraline Other (See  Comments)    Other reaction(s): Other Headaches   Simvastatin Other (See Comments)    Other reaction(s): Other myalgia   Trazodone And Nefazodone Nausea Only   I reviewed her past medical history, social history, family history, and environmental history and no significant changes have been reported from previous visits.  Review of Systems  Constitutional:  Negative for activity change and appetite change.  HENT:  Positive for congestion, postnasal drip, sinus pressure and sore throat. Negative for rhinorrhea.   Eyes:  Negative for pain, discharge, redness and itching.  Respiratory:  Positive for cough. Negative for shortness of breath, wheezing and stridor.   Gastrointestinal:  Negative for diarrhea, nausea and vomiting.  Endocrine: Negative for cold intolerance and heat intolerance.  Musculoskeletal:  Negative for arthralgias, joint swelling and myalgias.  Skin:  Negative for rash.  Allergic/Immunologic: Negative for environmental allergies and food allergies.    Objective:  Physical exam not obtained as encounter was done via telephone.   Previous notes and tests were reviewed.  I discussed the  assessment and treatment plan with the patient. The patient was provided an opportunity to ask questions and all were answered. The patient agreed with the plan and demonstrated an understanding of the instructions.   The patient was advised to call back or seek an in-person evaluation if the symptoms worsen or if the condition fails to improve as anticipated.  I provided 35 minutes of non-face-to-face time during this encounter.  It was my pleasure to participate in Brenae Lasecki care today. Please feel free to contact me with any questions or concerns.   Sincerely,  Valentina Shaggy, MD

## 2021-10-30 ENCOUNTER — Telehealth: Payer: Self-pay | Admitting: Gastroenterology

## 2021-10-30 NOTE — Telephone Encounter (Signed)
Hi Crystal,  Can you please call the patient and tell the patient the hydrogen breath test was normal?  She will benefit from implementing a low FODMAP diet (please send her a list) for a month and then slowly reintroducing the food that were known allowed.  Thanks,  Katrinka Blazing, MD Gastroenterology and Hepatology [facility]

## 2021-10-30 NOTE — Telephone Encounter (Signed)
Patient made aware testing was normal aware we are mailing a low FodMap diet for her to adhere to for a month. Patient says she understands all.

## 2021-10-31 ENCOUNTER — Other Ambulatory Visit: Payer: Self-pay

## 2021-10-31 ENCOUNTER — Encounter (HOSPITAL_COMMUNITY)
Admission: RE | Admit: 2021-10-31 | Discharge: 2021-10-31 | Disposition: A | Discharge status: Home / Self Care | Payer: Medicare Other | Source: Ambulatory Visit | Attending: Adult Health Nurse Practitioner | Admitting: Adult Health Nurse Practitioner

## 2021-10-31 DIAGNOSIS — M810 Age-related osteoporosis without current pathological fracture: Secondary | ICD-10-CM | POA: Insufficient documentation

## 2021-10-31 DIAGNOSIS — M8589 Other specified disorders of bone density and structure, multiple sites: Secondary | ICD-10-CM

## 2021-10-31 MED ORDER — DENOSUMAB 60 MG/ML ~~LOC~~ SOSY
60.0000 mg | PREFILLED_SYRINGE | Freq: Once | SUBCUTANEOUS | Status: AC
  Administered 2021-10-31: 60 mg via SUBCUTANEOUS

## 2021-10-31 NOTE — Progress Notes (Signed)
Diagnosis: Osteoporosis  Provider:  Roe Rutherford NP  Procedure: Injection  Prolia (Denosumab), Dose: 60 mg, Site: subcutaneous, Number of injections: 1  Discharge: Condition: Good, Destination: Home . AVS provided to patient.   Performed by:  Marin Shutter, RN

## 2021-11-08 ENCOUNTER — Ambulatory Visit (INDEPENDENT_AMBULATORY_CARE_PROVIDER_SITE_OTHER): Payer: Medicare Other

## 2021-11-08 DIAGNOSIS — J309 Allergic rhinitis, unspecified: Secondary | ICD-10-CM | POA: Diagnosis not present

## 2021-11-10 ENCOUNTER — Encounter: Payer: Self-pay | Admitting: Pulmonary Disease

## 2021-11-29 ENCOUNTER — Ambulatory Visit (HOSPITAL_COMMUNITY)
Admission: RE | Admit: 2021-11-29 | Discharge: 2021-11-29 | Disposition: A | Discharge status: Home / Self Care | Payer: Medicare Other | Source: Ambulatory Visit | Attending: Adult Health Nurse Practitioner | Admitting: Adult Health Nurse Practitioner

## 2021-11-29 ENCOUNTER — Other Ambulatory Visit (HOSPITAL_COMMUNITY): Payer: Self-pay | Admitting: Adult Health Nurse Practitioner

## 2021-11-29 DIAGNOSIS — R053 Chronic cough: Secondary | ICD-10-CM

## 2021-11-30 ENCOUNTER — Encounter (HOSPITAL_BASED_OUTPATIENT_CLINIC_OR_DEPARTMENT_OTHER): Payer: Self-pay

## 2021-11-30 DIAGNOSIS — R0683 Snoring: Secondary | ICD-10-CM

## 2021-12-06 ENCOUNTER — Ambulatory Visit (INDEPENDENT_AMBULATORY_CARE_PROVIDER_SITE_OTHER): Payer: Medicare Other

## 2021-12-06 DIAGNOSIS — J309 Allergic rhinitis, unspecified: Secondary | ICD-10-CM

## 2021-12-13 ENCOUNTER — Ambulatory Visit (INDEPENDENT_AMBULATORY_CARE_PROVIDER_SITE_OTHER): Payer: Medicare Other

## 2021-12-13 DIAGNOSIS — J309 Allergic rhinitis, unspecified: Secondary | ICD-10-CM

## 2021-12-14 ENCOUNTER — Telehealth (INDEPENDENT_AMBULATORY_CARE_PROVIDER_SITE_OTHER): Payer: Self-pay

## 2021-12-14 ENCOUNTER — Encounter (INDEPENDENT_AMBULATORY_CARE_PROVIDER_SITE_OTHER): Payer: Self-pay | Admitting: Gastroenterology

## 2021-12-14 ENCOUNTER — Encounter (INDEPENDENT_AMBULATORY_CARE_PROVIDER_SITE_OTHER): Payer: Self-pay

## 2021-12-14 ENCOUNTER — Ambulatory Visit (INDEPENDENT_AMBULATORY_CARE_PROVIDER_SITE_OTHER): Payer: Medicare Other | Admitting: Gastroenterology

## 2021-12-14 VITALS — BP 139/71 | HR 77 | Temp 98.2°F | Ht 59.0 in | Wt 114.3 lb

## 2021-12-14 DIAGNOSIS — R0989 Other specified symptoms and signs involving the circulatory and respiratory systems: Secondary | ICD-10-CM | POA: Diagnosis not present

## 2021-12-14 DIAGNOSIS — K219 Gastro-esophageal reflux disease without esophagitis: Secondary | ICD-10-CM | POA: Diagnosis not present

## 2021-12-14 DIAGNOSIS — R1319 Other dysphagia: Secondary | ICD-10-CM | POA: Diagnosis not present

## 2021-12-14 DIAGNOSIS — R1310 Dysphagia, unspecified: Secondary | ICD-10-CM | POA: Insufficient documentation

## 2021-12-14 DIAGNOSIS — K580 Irritable bowel syndrome with diarrhea: Secondary | ICD-10-CM | POA: Diagnosis not present

## 2021-12-14 MED ORDER — OMEPRAZOLE 20 MG PO CPDR: 20.0000 mg | DELAYED_RELEASE_CAPSULE | Freq: Every day | ORAL | 3 refills | Status: DC

## 2021-12-14 NOTE — Patient Instructions (Addendum)
Decrease omeprazole to 20 mg qday Schedule barium esophagram with pill Follow up with allergist

## 2021-12-14 NOTE — Progress Notes (Signed)
Karen Horn, M.D. Gastroenterology & Hepatology [facility] [address] [place], [place] [address]  Primary Care Physician: Pablo Lawrence, NP [place] [id number]  I will communicate my assessment and recommendations to the referring MD via EMR.  Problems: Dysphagia and throat clearing IBS  History of Present Illness: Karen Horn is a 76 y.o. female with Pmh DM and hypothyroidism,  who presents for follow up of bloating and for evaluation of dysphagia with throat clearing.  The patient was last seen on 08/14/2021. At that time, the patient was given a prescription for rifaximin for 2 weeks.  She did not tolerate the medication and had to stop it.  Underwent an EGD for further evaluation of her symptoms with findings described below.  Given negative investigations, the patient had a SIBO breath test on 10/26/2021, which was negative.  She was given a low FODMAP diet list as her symptoms were attributed to IBS. Patient states her symptoms resolved on their own.  States she is now concerned as a month ago she noticed intermittent episodes of dysphagia with solid food. She has to drink some liquid occasionally to make the food go down. Very rarely nausea episodes.  The patient reports that since April she has had recurrent episodes of hoarseness.Believes there could be some nasal drainage. Also has frequent throat clearing. She is supposed to see the allergist on early November. She was prescribed Flonase a week ago by her PCP but has not noticed any difference in her symptoms.  The patient denies having any vomiting, fever, chills, hematochezia, melena, hematemesis, abdominal pain, diarrhea, jaundice, pruritus or weight loss. She can have days that she 3-4 Bms per day, while other days she may have 1-2 Bms.  She has been taking omeprazole 40 mg qday without any improvement of her symptoms.  Last EGD: 08/2011 - 3  cm hiatal hernia. - Erythematous mucosa in the antrum. Biopsied. - Normal examined duodenum. Biopsied.  Path: FINAL MICROSCOPIC DIAGNOSIS:   A.   SMALL BOWEL, BIOPSY:  -    Small intestinal mucosa with no specific pathologic diagnosis.  -    Normal long and slender villi, without identified pathologic  intraepithelial lymphocytosis.  -    Negative for adenomatous change and malignancy.   B.   STOMACH, BIOPSY:  -    Mild chronic gastritis, with focal intestinal metaplasia and  features of reactive gastropathy, antral.  -    Gastric oxyntic mucosa with no specific pathologic diagnosis.  -    Helicobacter pylori immunohistochemical (IHC) stain negative for  organisms, with adequate controls.  -    No intestinal metaplasia, dysplasia, or malignancy.   Last Colonoscopy:2015 - hemorrhoids, performed at [facility]  Past Medical History: Past Medical History:  Diagnosis Date   Diabetes mellitus without complication ([place])    Thyroid disease     Past Surgical History: Past Surgical History:  Procedure Laterality Date   ABDOMINAL HYSTERECTOMY     ADENOIDECTOMY     BACK SURGERY     BIOPSY  09/06/2021   Procedure: BIOPSY;  Surgeon: Harvel Quale, MD;  Location: [facility];  Service: Gastroenterology;;   ESOPHAGOGASTRODUODENOSCOPY (EGD) WITH PROPOFOL N/A 09/06/2021   Procedure: ESOPHAGOGASTRODUODENOSCOPY (EGD) WITH PROPOFOL;  Surgeon: Harvel Quale, MD;  Location: [facility];  Service: Gastroenterology;  Laterality: N/A;  215 ASA 2   SINOSCOPY     TONSILLECTOMY      Family History:History  reviewed. No pertinent family history.  Social History: Social History   Tobacco Use  Smoking Status Never  Smokeless Tobacco Never   Social History   Substance and Sexual Activity  Alcohol Use Not Currently   Social History   Substance and Sexual Activity  Drug Use Never    Allergies: Allergies  Allergen Reactions   Codeine Rash    Other reaction(s):  Chest Pain   Gabapentin Other (See Comments)    Other reaction(s): Other Depression, suicidal thoughts   Sulfa Antibiotics Rash and Other (See Comments)    chest Pain   Atorvastatin Calcium Other (See Comments)    Other reaction(s): Other myalgia   Escitalopram Other (See Comments)    Other reaction(s): Other Headaches   Ezetimibe Other (See Comments)    Other reaction(s): Other Myalgia-patient wants to retry generic 10/17   Fluvastatin Other (See Comments)    Other reaction(s): Other myalgia   Fosamax [Alendronate]     Other reaction(s): Other Leg pain   Levofloxacin Other (See Comments)    Other reaction(s): Other Muscle aching in her shoulders, inner thighs and legs   Pramipexole Nausea Only and Other (See Comments)    Headache    Prednisone Other (See Comments)    Unknown reaction type   Rosuvastatin Calcium Other (See Comments)    Other reaction(s): Other myalgia   Sertraline Other (See Comments)    Other reaction(s): Other Headaches   Simvastatin Other (See Comments)    Other reaction(s): Other myalgia   Trazodone And Nefazodone Nausea Only    Medications: Current Outpatient Medications  Medication Sig Dispense Refill   ACCU-CHEK AVIVA PLUS test strip 2 (two) times daily.     ALPRAZolam (XANAX) 0.5 MG tablet Take 0.25 mg by mouth daily as needed for anxiety.     aspirin 81 MG chewable tablet Chew 81 mg by mouth in the morning.     atorvastatin (LIPITOR) 10 MG tablet Take 10 mg by mouth every Monday, Wednesday, and Friday.     Blood Glucose Monitoring Suppl (ACCU-CHEK AVIVA PLUS) w/Device KIT by Does not apply route.     cholecalciferol (VITAMIN D) 25 MCG (1000 UNIT) tablet Take 2,000 Units by mouth in the morning.     EPINEPHrine 0.3 mg/0.3 mL IJ SOAJ injection epinephrine 0.3 mg/0.3 mL injection, auto-injector (Patient taking differently: Inject 0.3 mg into the muscle as needed for anaphylaxis.) 1 each 1   glipiZIDE (GLUCOTROL XL) 5 MG 24 hr tablet Take 10 mg  by mouth daily with breakfast.     glucose blood (ACCU-CHEK AVIVA PLUS) test strip TEST SUGAR 3-4 TIMES A DAY     insulin degludec (TRESIBA FLEXTOUCH) 200 UNIT/ML FlexTouch Pen Inject 10 Units into the skin 2 (two) times daily.     Insulin Pen Needle (SURE COMFORT PEN NEEDLES) 31G X 5 MM MISC Sure Comfort Pen Needle 31 gauge x 5/16"     Lactobacillus (PROBIOTIC ACIDOPHILUS) CAPS Take 1 capsule by mouth in the morning.     levothyroxine (SYNTHROID) 75 MCG tablet Take 75 mcg by mouth daily before breakfast.     omeprazole (PRILOSEC) 40 MG capsule Take 1 capsule (40 mg total) by mouth daily. 90 capsule 3   rOPINIRole (REQUIP) 0.25 MG tablet Take 0.25 mg by mouth at bedtime.     sertraline (ZOLOFT) 25 MG tablet Take 25 mg by mouth at bedtime.     valACYclovir (VALTREX) 500 MG tablet Take 500 mg by mouth in the morning.  No current facility-administered medications for this visit.    Review of Systems: GENERAL: negative for malaise, night sweats HEENT: No changes in hearing or vision, no nose bleeds or other nasal problems. NECK: Negative for lumps, goiter, pain and significant neck swelling RESPIRATORY: Negative for cough, wheezing CARDIOVASCULAR: Negative for chest pain, leg swelling, palpitations, orthopnea GI: SEE HPI MUSCULOSKELETAL: Negative for joint pain or swelling, back pain, and muscle pain. SKIN: Negative for lesions, rash PSYCH: Negative for sleep disturbance, mood disorder and recent psychosocial stressors. HEMATOLOGY Negative for prolonged bleeding, bruising easily, and swollen nodes. ENDOCRINE: Negative for cold or heat intolerance, polyuria, polydipsia and goiter. NEURO: negative for tremor, gait imbalance, syncope and seizures. The remainder of the review of systems is noncontributory.   Physical Exam: BP 139/71 (BP Location: Left Arm, Patient Position: Sitting, Cuff Size: Small)   Pulse 77   Temp 98.2 F (36.8 C) (Oral)   Ht 4' 11" (1.499 m)   Wt 114 lb 4.8 oz  (51.8 kg)   BMI 23.09 kg/m  GENERAL: The patient is AO x3, in no acute distress. HEENT: Head is normocephalic and atraumatic. EOMI are intact. Mouth is well hydrated and without lesions. NECK: Supple. No masses LUNGS: Clear to auscultation. No presence of rhonchi/wheezing/rales. Adequate chest expansion HEART: RRR, normal s1 and s2. ABDOMEN: Soft, nontender, no guarding, no peritoneal signs, and nondistended. BS +. No masses. EXTREMITIES: Without any cyanosis, clubbing, rash, lesions or edema. NEUROLOGIC: AOx3, no focal motor deficit. SKIN: no jaundice, no rashes  Imaging/Labs: as above  I personally reviewed and interpreted the available labs, imaging and endoscopic files.  Impression and Plan: Karen Horn is a 76 y.o. female with Pmh DM and hypothyroidism,  who presents for follow up of bloating and for evaluation of dysphagia with throat clearing.  The patient has presented resolution of her bloating episodes.  She has an extensive work-up which was negative for celiac disease, small intestinal bacterial overgrowth, peptic ulcer disease or other endoluminal upper gastrointestinal etiologies.  As her symptoms resolved on her own and she has not presented any red flag signs, I discussed with her that this was likely related to irritable bowel syndrome.  However, more recently she has presented episodes of dysphagia and chronic throat clearing with globus sensation.  We discussed that the symptoms could be related to esophageal hypersensitivity, but may need to be explored further with an esophagram.  We discussed that if this investigation is unremarkable, she may need to proceed with manometry but she did not seem to be interested in this.  She will follow-up with an allergist and may consider having an evaluation by ENT.  As she has presented very poor symptom improvement with the PPI, we will decrease the dosage of omeprazole to 20 mg every day.  I advised her that she can increase  her dosage back to 40 mg.  She presents worsening symptoms.  - Decrease omeprazole to 20 mg qday - Schedule barium esophagram with pill - Follow up with allergist  All questions were answered.      Karen Peppers, MD Gastroenterology and Hepatology [facility]

## 2021-12-14 NOTE — Telephone Encounter (Signed)
Brisia Schuermann Ann Kalei Mckillop, CMA  ?

## 2021-12-18 ENCOUNTER — Ambulatory Visit (HOSPITAL_COMMUNITY)
Admission: RE | Admit: 2021-12-18 | Discharge: 2021-12-18 | Disposition: A | Discharge status: Home / Self Care | Payer: Medicare Other | Source: Ambulatory Visit | Attending: Gastroenterology | Admitting: Gastroenterology

## 2021-12-18 DIAGNOSIS — R1319 Other dysphagia: Secondary | ICD-10-CM | POA: Insufficient documentation

## 2021-12-19 ENCOUNTER — Ambulatory Visit (HOSPITAL_COMMUNITY): Admit: 2021-12-19 | Payer: Medicare Other | Admitting: Gastroenterology

## 2021-12-19 ENCOUNTER — Encounter (HOSPITAL_COMMUNITY): Payer: Self-pay

## 2021-12-19 ENCOUNTER — Telehealth (INDEPENDENT_AMBULATORY_CARE_PROVIDER_SITE_OTHER): Payer: Self-pay

## 2021-12-19 SURGERY — ESOPHAGOGASTRODUODENOSCOPY (EGD) WITH PROPOFOL
Anesthesia: Monitor Anesthesia Care

## 2021-12-19 NOTE — Telephone Encounter (Signed)
  I called and left a message stating the below. I asked that she please return call to the office if she wants to proceed with manometry.  I called the patient and to inform about the results, but the patient did not pick up the phone. I left a detailed voice message with the findings and recommendations. Esophagram showed presence of possible diffuse age-related dysmotility without other findings besides of a small sliding hiatal hernia. Emphasize that in order to evaluate this further she may need to have an esophageal manometry but was not interested in this in her last appointment.  If she changes her mind we will refer her to [facility] for manometry.   Thanks,

## 2021-12-26 NOTE — Patient Instructions (Incomplete)
1. Seasonal and perennial allergic rhinitis - Continue with allergy shots at the same schedule for now - EpiPen refill sent. Reviewed proper technique - Continue with your antihistamine prior to allergy shots. - Consider immune work up if continues to have infections  2. Schedule a follow up appointment in months or sooner if needed

## 2021-12-27 ENCOUNTER — Ambulatory Visit: Payer: Medicare Other | Admitting: Family

## 2021-12-27 ENCOUNTER — Encounter: Payer: Self-pay | Admitting: Family

## 2021-12-27 VITALS — BP 128/70 | HR 65 | Temp 98.0°F | Resp 16 | Ht 60.75 in | Wt 113.1 lb

## 2021-12-27 DIAGNOSIS — R053 Chronic cough: Secondary | ICD-10-CM | POA: Diagnosis not present

## 2021-12-27 DIAGNOSIS — J3089 Other allergic rhinitis: Secondary | ICD-10-CM | POA: Diagnosis not present

## 2021-12-27 NOTE — Patient Instructions (Addendum)
1. Seasonal and perennial allergic rhinitis Copy of skin test given -Form signed to stop allergy injections - Let us know if your allergies get worse after stopping allergy injections - May use an over the counter antihistamine such as Claritin, Zyrtec, Xyzal, or Allegra once a day as needed for runny nose - Continue with Flonase 1-2 sprays in each nostril once a day as needed for stuffy nose  2.cough -Spirometry today was normal, so I would recommend stopping Symbicort and you still have albuterol to use 2 puffs every 4-6 hours as needed for cough, wheeze, tightness in chest, or shortness of breath.  You could try using the albuterol to see if this does help with the cough. -She wants to hold off on doing a 30 day trial of increasing her omeprazole to twice a day until she sees her primary care physician We will refer you to ENT due to the sensation of something being stuck in your throat and hoarseness since April   Schedule an appointment in 4 weeks or sooner if needed

## 2021-12-27 NOTE — Progress Notes (Signed)
[address] [place] [address] Dept: [phone]  FOLLOW UP NOTE  Patient ID: Karen Horn, female    DOB: 1945-08-13  Age: 76 y.o. MRN: [id number] Date of Office Visit: 12/27/2021  Assessment  Chief Complaint: Allergy Testing  HPI Karen Horn is a 76 year old female who presents today for skin testing to environmental allergens.  She was last seen on October 26, 2021 by Dr. Dellis Anes for seasonal and perennial allergic rhinitis, acute sinusitis, and COVID testing negative.  She denies any new diagnosis or surgery since her last office visit.  Seasonal and perennial allergic rhinitis: She only takes allergy relief on the days she gets allergy injections.  She also has Flonase that she uses as needed.  She continues to receive allergy injections per protocol and has an EpiPen that is up-to-date.  She does mention that her allergy injections do help and she has been on them for  20 plus years.  She is interested stopping if possible.  Prior to being on allergy injections she used to have to carry a Kleenex with her all the time.  She does note that after she gets her allergy injections one of the injection sites gets a little bigger than the other, but it is not huge.  She reports nasal congestion that she has kept her whole life.  She denies rhinorrhea and postnasal drip.  She has not had anysinus infections since we last.  After reviewing Epic it does look like she was treated on September 27 by her primary care physician for acute nonrecurrent maxillary sinusitis, acute cough, and laryngitis.  She was given cefdinir Depo 80 mg, and a cough medicine.  She reports that since April she has had the sensation of something being stuck in her throat.  She also has had hoarseness since then.  She will cough due to this sensation.  She has been on 3 separate rounds of antibiotics and they have not helped any.  She has tried Chloraseptic spray and it helps some.  She is  wondering if it is drainage.  She also mentions that she has remodeled her house and that she is allergic to mold.  She wonders if she has been exposed to mold and this is causing her symptoms.  Her husband is starting to get this cough along with another family member.  Her primary care physician did put her on Symbicort approximately a month ago, but she has not been using it daily because she has never been diagnosed with asthma or COPD and is concerned about taking the medicine for something that she possibly does not have.  She also has an albuterol inhaler, but she is not used it enough to tell to see if it helps.  She did have a chest x-ray on November 29, 2021 showing: "IMPRESSION: 1. No active cardiopulmonary disease. No evidence of pneumonia or pulmonary edema. 2. Chronic bronchitic changes.  She is currently treated for reflux with omeprazole once a day.  She denies any heartburn or reflux symptoms.  Back in April she mentions that she was throwing up a lot whenever she ate food and had bloating   She did see GI.  After seeing GI she does not have any more problems with throwing up after eating or bloatin.  Her note from GI from December 14, 2021 shows:  "Impression and Plan: Karen Horn is a 76 y.o. female with Pmh DM and hypothyroidism,  who presents for follow up of bloating and for  evaluation of dysphagia with throat clearing.  The patient has presented resolution of her bloating episodes.  She has an extensive work-up which was negative for celiac disease, small intestinal bacterial overgrowth, peptic ulcer disease or other endoluminal upper gastrointestinal etiologies.  As her symptoms resolved on her own and she has not presented any red flag signs, I discussed with her that this was likely related to irritable bowel syndrome.   However, more recently she has presented episodes of dysphagia and chronic throat clearing with globus sensation.  We discussed that the symptoms could be  related to esophageal hypersensitivity, but may need to be explored further with an esophagram.  We discussed that if this investigation is unremarkable, she may need to proceed with manometry but she did not seem to be interested in this.  She will follow-up with an allergist and may consider having an evaluation by ENT.   As she has presented very poor symptom improvement with the PPI, we will decrease the dosage of omeprazole to 20 mg every day.  I advised her that she can increase her dosage back to 40 mg.  She presents worsening symptoms.   - Decrease omeprazole to 20 mg qday - Schedule barium esophagram with pill - Follow up with allergist"  Drug Allergies:  Allergies  Allergen Reactions   Codeine Rash    Other reaction(s): Chest Pain   Gabapentin Other (See Comments)    Other reaction(s): Other Depression, suicidal thoughts   Sulfa Antibiotics Rash and Other (See Comments)    chest Pain   Atorvastatin Calcium Other (See Comments)    Other reaction(s): Other myalgia   Escitalopram Other (See Comments)    Other reaction(s): Other Headaches   Ezetimibe Other (See Comments)    Other reaction(s): Other Myalgia-patient wants to retry generic 10/17   Fluvastatin Other (See Comments)    Other reaction(s): Other myalgia   Fosamax [Alendronate]     Other reaction(s): Other Leg pain   Levofloxacin Other (See Comments)    Other reaction(s): Other Muscle aching in her shoulders, inner thighs and legs   Pramipexole Nausea Only and Other (See Comments)    Headache    Prednisone Other (See Comments)    Unknown reaction type   Rosuvastatin Calcium Other (See Comments)    Other reaction(s): Other myalgia   Sertraline Other (See Comments)    Other reaction(s): Other Headaches   Simvastatin Other (See Comments)    Other reaction(s): Other myalgia   Trazodone And Nefazodone Nausea Only    Review of Systems: Review of Systems  Constitutional:  Negative for chills and fever.   HENT:         Reports nasal congestion that she has had her whole life.  Denies rhinorrhea and postnasal drip  Eyes:        Denies itchy watery eyes  Cardiovascular:  Positive for chest pain. Negative for palpitations.       Reports chest pain underneath her left breast at times.  She has not spoken with her primary care physician about this.  Instructed her to speak with her primary care physician.  Gastrointestinal:        Denies heartburn or reflux symptoms on omeprazole once a day.  Does report sensation of feeling something in her throat that she cannot swallow  Genitourinary:  Negative for frequency.  Skin:  Negative for itching and rash.  Neurological:  Positive for headaches.       Reports very random headaches  Endo/Heme/Allergies:  Positive for environmental allergies.     Physical Exam: BP 128/70   Pulse 65   Temp 98 F (36.7 C)   Resp 16   Ht 5' 0.75" (1.543 m)   Wt 113 lb 2 oz (51.3 kg)   SpO2 96%   BMI 21.55 kg/m    Physical Exam Constitutional:      Appearance: Normal appearance.  HENT:     Head: Normocephalic and atraumatic.     Comments: Pharynx normal, eyes normal, ears normal, nose: Bilateral lower turbinates mildly edematous and slightly erythematous with no drainage noted    Right Ear: Tympanic membrane, ear canal and external ear normal.     Left Ear: Tympanic membrane, ear canal and external ear normal.     Mouth/Throat:     Mouth: Mucous membranes are moist.     Pharynx: Oropharynx is clear.  Eyes:     Conjunctiva/sclera: Conjunctivae normal.  Cardiovascular:     Rate and Rhythm: Normal rate and regular rhythm.     Heart sounds: Normal heart sounds.  Pulmonary:     Effort: Pulmonary effort is normal.     Breath sounds: Normal breath sounds.     Comments: Lungs clear to auscultation Musculoskeletal:     Cervical back: Neck supple.  Skin:    General: Skin is warm.  Neurological:     Mental Status: She is alert and oriented to person, place,  and time.  Psychiatric:        Mood and Affect: Mood normal.        Behavior: Behavior normal.        Thought Content: Thought content normal.        Judgment: Judgment normal.     Diagnostics: FVC 2.16 L (93%), FEV1 1.87 L (104%).  Predicted FVC 2.32 L, predicted FEV1 1.80 L.  Spirometry indicates normal respiratory function.  Percutaneous skin testing today was positive to dust mite with a good histamine response.  Intradermal skin testing was +/- to mold mix 2  Assessment and Plan: 1. Seasonal and perennial allergic rhinitis   2. Chronic cough     No orders of the defined types were placed in this encounter.   Patient Instructions  1. Seasonal and perennial allergic rhinitis Copy of skin test given -Form signed to stop allergy injections - Let us know if your allergies get worse after stopping allergy injections - May use an over the counter antihistamine such as Claritin, Zyrtec, Xyzal, or Allegra once a day as needed for runny nose - Continue with Flonase 1-2 sprays in each nostril once a day as needed for stuffy nose  2.cough -Spirometry today was normal, so I would recommend stopping Symbicort and you still have albuterol to use 2 puffs every 4-6 hours as needed for cough, wheeze, tightness in chest, or shortness of breath.  You could try using the albuterol to see if this does help with the cough. -She wants to hold off on doing a 30 day trial of increasing her omeprazole to twice a day until she sees her primary care physician We will refer you to ENT due to the sensation of something being stuck in your throat and hoarseness since April   Schedule an appointment in 4 weeks or sooner if needed  Return in about 4 weeks (around 01/24/2022), or if symptoms worsen or fail to improve.    Thank you for the opportunity to care for this patient.  Please do not hesitate to contact me with  questions.  Nehemiah Settle, FNP [facility]

## 2022-01-01 ENCOUNTER — Other Ambulatory Visit (HOSPITAL_COMMUNITY): Payer: Self-pay | Admitting: Adult Health Nurse Practitioner

## 2022-01-01 DIAGNOSIS — Z1231 Encounter for screening mammogram for malignant neoplasm of breast: Secondary | ICD-10-CM

## 2022-01-02 ENCOUNTER — Encounter: Payer: Self-pay | Admitting: Orthopaedic Surgery

## 2022-01-02 ENCOUNTER — Ambulatory Visit (INDEPENDENT_AMBULATORY_CARE_PROVIDER_SITE_OTHER): Payer: Medicare Other

## 2022-01-02 ENCOUNTER — Ambulatory Visit: Payer: Medicare Other | Admitting: Orthopaedic Surgery

## 2022-01-02 DIAGNOSIS — M25562 Pain in left knee: Secondary | ICD-10-CM

## 2022-01-02 DIAGNOSIS — G8929 Other chronic pain: Secondary | ICD-10-CM

## 2022-01-02 MED ORDER — METHYLPREDNISOLONE ACETATE 40 MG/ML IJ SUSP
40.0000 mg | Freq: Once | INTRAMUSCULAR | Status: AC
  Administered 2022-01-02: 40 mg via INTRA_ARTICULAR

## 2022-01-02 NOTE — Addendum Note (Signed)
Addended by: Recardo Evangelist A on: 01/02/2022 09:22 AM   Modules accepted: Orders

## 2022-01-02 NOTE — Progress Notes (Signed)
My left knee hurts.  She has had pain in the left knee for about two months getting worse.  She has medial pain, popping and swelling. She also has giving way.  She has no trauma, no redness.  She has pain going down the steps and in any squatting activity.  She has no numbness.  It is getting worse gradually.  Tylenol and ice, rest, heat are not helping.  Left knee has ROM 0 to 105, medial pain, crepitus, slight effusion, positive medial Mc[place]ray, limp left, NV intact, no distal edema.  X-rays were done of the left knee, reported separately.  Encounter Diagnosis  Name Primary?   Chronic pain of left knee Yes   PROCEDURE NOTE:  The patient requests injections of the left knee , verbal consent was obtained.  The left knee was prepped appropriately after time out was performed.   Sterile technique was observed and injection of 1 cc of DepoMedrol 40 mg with several cc's of plain xylocaine. Anesthesia was provided by ethyl chloride and a 20-gauge needle was used to inject the knee area. The injection was tolerated well.  A band aid dressing was applied.  The patient was advised to apply ice later today and tomorrow to the injection sight as needed.  I have recommended Aleve after lunch and supper.  She takes ASA after breakfast.  Return in two weeks.  She may need MRI of the knee.  I am concerned about medial meniscus tear.  Call if any problem.  Precautions discussed.  Electronically Signed Darreld Mclean, MD 11/14/20238:52 AM

## 2022-01-04 ENCOUNTER — Ambulatory Visit: Payer: Medicare Other | Admitting: Internal Medicine

## 2022-01-09 ENCOUNTER — Telehealth: Payer: Self-pay

## 2022-01-09 NOTE — Telephone Encounter (Signed)
-----   Message from Nehemiah Settle, FNP sent at 12/27/2021 11:13 AM EST ----- Please refer to ENT due to hoarseness since April/ cough/ and globus sensation

## 2022-01-15 ENCOUNTER — Encounter: Payer: Self-pay | Admitting: Internal Medicine

## 2022-01-15 ENCOUNTER — Ambulatory Visit (INDEPENDENT_AMBULATORY_CARE_PROVIDER_SITE_OTHER): Payer: Medicare Other | Admitting: Internal Medicine

## 2022-01-15 VITALS — BP 151/75 | HR 82 | Ht 60.0 in | Wt 113.4 lb

## 2022-01-15 DIAGNOSIS — E1169 Type 2 diabetes mellitus with other specified complication: Secondary | ICD-10-CM | POA: Diagnosis not present

## 2022-01-15 DIAGNOSIS — K219 Gastro-esophageal reflux disease without esophagitis: Secondary | ICD-10-CM | POA: Diagnosis not present

## 2022-01-15 DIAGNOSIS — E039 Hypothyroidism, unspecified: Secondary | ICD-10-CM

## 2022-01-15 DIAGNOSIS — Z0001 Encounter for general adult medical examination with abnormal findings: Secondary | ICD-10-CM | POA: Diagnosis not present

## 2022-01-15 DIAGNOSIS — E559 Vitamin D deficiency, unspecified: Secondary | ICD-10-CM

## 2022-01-15 DIAGNOSIS — E119 Type 2 diabetes mellitus without complications: Secondary | ICD-10-CM

## 2022-01-15 DIAGNOSIS — N393 Stress incontinence (female) (male): Secondary | ICD-10-CM

## 2022-01-15 DIAGNOSIS — B009 Herpesviral infection, unspecified: Secondary | ICD-10-CM

## 2022-01-15 DIAGNOSIS — Z1159 Encounter for screening for other viral diseases: Secondary | ICD-10-CM

## 2022-01-15 DIAGNOSIS — D508 Other iron deficiency anemias: Secondary | ICD-10-CM | POA: Diagnosis not present

## 2022-01-15 DIAGNOSIS — Z794 Long term (current) use of insulin: Secondary | ICD-10-CM

## 2022-01-15 DIAGNOSIS — F419 Anxiety disorder, unspecified: Secondary | ICD-10-CM

## 2022-01-15 DIAGNOSIS — F32A Depression, unspecified: Secondary | ICD-10-CM

## 2022-01-15 DIAGNOSIS — G2581 Restless legs syndrome: Secondary | ICD-10-CM | POA: Diagnosis not present

## 2022-01-15 DIAGNOSIS — E785 Hyperlipidemia, unspecified: Secondary | ICD-10-CM

## 2022-01-15 NOTE — Progress Notes (Unsigned)
New Patient Office Visit  Subjective    Patient ID: Karen Horn, female    DOB: 24-Oct-1945  Age: 76 y.o. MRN: [id number]  CC:  Chief Complaint  Patient presents with   Establish Care   HPI Karen Horn presents to establish care.  She is a 76 year old woman who endorses a past medical history significant for T2DM, hypothyroidism, HLD, urge/stress incontinence, anxiety/depression, GERD, RLS, osteoporosis, and HSV.  She was previously followed at [facility] in [place], [place]. Ms. Wahab reports feeling well today.  She has no acute concerns discussed.  Chronic medical conditions and outstanding preventative care items discussed today are individually addressed in A/P below.  Outpatient Encounter Medications as of 01/15/2022  Medication Sig   ACCU-CHEK AVIVA PLUS test strip 2 (two) times daily.   albuterol (VENTOLIN HFA) 108 (90 Base) MCG/ACT inhaler Inhale into the lungs.   ALPRAZolam (XANAX) 0.5 MG tablet Take 0.25 mg by mouth daily as needed for anxiety.   aspirin 81 MG chewable tablet Chew 81 mg by mouth in the morning.   atorvastatin (LIPITOR) 10 MG tablet Take 10 mg by mouth every Monday, Wednesday, and Friday.   Blood Glucose Monitoring Suppl (ACCU-CHEK AVIVA PLUS) w/Device KIT by Does not apply route.   cholecalciferol (VITAMIN D) 25 MCG (1000 UNIT) tablet Take 2,000 Units by mouth in the morning.   EPINEPHrine 0.3 mg/0.3 mL IJ SOAJ injection epinephrine 0.3 mg/0.3 mL injection, auto-injector (Patient taking differently: Inject 0.3 mg into the muscle as needed for anaphylaxis.)   fluticasone (FLONASE) 50 MCG/ACT nasal spray Place into the nose.   glipiZIDE (GLUCOTROL XL) 5 MG 24 hr tablet Take 5 mg by mouth daily with breakfast.   glucose blood (ACCU-CHEK AVIVA PLUS) test strip TEST SUGAR 3-4 TIMES A DAY   insulin degludec (TRESIBA FLEXTOUCH) 200 UNIT/ML FlexTouch Pen Inject 10 Units into the skin 2 (two) times daily.   Insulin Pen Needle (SURE COMFORT PEN NEEDLES) 31G  X 5 MM MISC Sure Comfort Pen Needle 31 gauge x 5/16"   Lactobacillus (PROBIOTIC ACIDOPHILUS) CAPS Take 1 capsule by mouth in the morning.   levothyroxine (SYNTHROID) 75 MCG tablet Take 75 mcg by mouth daily before breakfast.   omeprazole (PRILOSEC) 20 MG capsule Take 1 capsule (20 mg total) by mouth daily.   rOPINIRole (REQUIP) 0.25 MG tablet Take 0.25 mg by mouth at bedtime.   sertraline (ZOLOFT) 25 MG tablet Take 25 mg by mouth at bedtime.   valACYclovir (VALTREX) 500 MG tablet Take 500 mg by mouth in the morning.   [DISCONTINUED] budesonide-formoterol (SYMBICORT) 80-4.5 MCG/ACT inhaler Inhale into the lungs.   No facility-administered encounter medications on file as of 01/15/2022.   Past Medical History:  Diagnosis Date   Diabetes mellitus without complication ([place])    Thyroid disease    Past Surgical History:  Procedure Laterality Date   ABDOMINAL HYSTERECTOMY     ADENOIDECTOMY     BACK SURGERY     BIOPSY  09/06/2021   Procedure: BIOPSY;  Surgeon: Montez Morita, Quillian Quince, MD;  Location: [facility];  Service: Gastroenterology;;   ESOPHAGOGASTRODUODENOSCOPY (EGD) WITH PROPOFOL N/A 09/06/2021   Procedure: ESOPHAGOGASTRODUODENOSCOPY (EGD) WITH PROPOFOL;  Surgeon: Harvel Quale, MD;  Location: [facility];  Service: Gastroenterology;  Laterality: N/A;  215 ASA 2   SINOSCOPY     TONSILLECTOMY     History reviewed. No pertinent family history.  Social History   Socioeconomic History   Marital status: Married    Spouse  name: Not on file   Number of children: Not on file   Years of education: Not on file   Highest education level: Not on file  Occupational History   Not on file  Tobacco Use   Smoking status: Never   Smokeless tobacco: Never  Vaping Use   Vaping Use: Never used  Substance and Sexual Activity   Alcohol use: Not Currently   Drug use: Never   Sexual activity: Not on file  Other Topics Concern   Not on file  Social History Narrative   Not  on file   Social Determinants of Health   Financial Resource Strain: Not on file  Food Insecurity: Not on file  Transportation Needs: Not on file  Physical Activity: Not on file  Stress: Not on file  Social Connections: Not on file  Intimate Partner Violence: Not on file    Review of Systems  Constitutional:  Negative for chills and fever.  HENT:  Negative for sore throat.   Respiratory:  Negative for cough and shortness of breath.   Cardiovascular:  Negative for chest pain, palpitations and leg swelling.  Gastrointestinal:  Negative for abdominal pain, blood in stool, constipation, diarrhea, nausea and vomiting.  Genitourinary:  Negative for dysuria and hematuria.  Musculoskeletal:  Negative for myalgias.  Skin:  Negative for itching and rash.  Neurological:  Negative for dizziness and headaches.  Psychiatric/Behavioral:  Negative for depression and suicidal ideas.    Objective    BP (!) 151/75   Pulse 82   Ht 5' (1.524 m)   Wt 113 lb 6.4 oz (51.4 kg)   SpO2 95%   BMI 22.15 kg/m   Physical Exam Vitals reviewed.  Constitutional:      General: She is not in acute distress.    Appearance: Normal appearance. She is not toxic-appearing.  HENT:     Head: Normocephalic and atraumatic.     Right Ear: External ear normal.     Left Ear: External ear normal.     Nose: Nose normal. No congestion or rhinorrhea.     Mouth/Throat:     Mouth: Mucous membranes are moist.     Pharynx: Oropharynx is clear. No oropharyngeal exudate or posterior oropharyngeal erythema.  Eyes:     General: No scleral icterus.    Extraocular Movements: Extraocular movements intact.     Conjunctiva/sclera: Conjunctivae normal.     Pupils: Pupils are equal, round, and reactive to light.  Cardiovascular:     Rate and Rhythm: Normal rate and regular rhythm.     Pulses: Normal pulses.     Heart sounds: Normal heart sounds. No murmur heard.    No friction rub. No gallop.  Pulmonary:     Effort:  Pulmonary effort is normal.     Breath sounds: Normal breath sounds. No wheezing, rhonchi or rales.  Abdominal:     General: Abdomen is flat. Bowel sounds are normal. There is no distension.     Palpations: Abdomen is soft.     Tenderness: There is no abdominal tenderness.  Musculoskeletal:        General: No swelling. Normal range of motion.     Cervical back: Normal range of motion.     Right lower leg: No edema.     Left lower leg: No edema.  Lymphadenopathy:     Cervical: No cervical adenopathy.  Skin:    General: Skin is warm and dry.     Capillary Refill: Capillary refill takes less  than 2 seconds.     Coloration: Skin is not jaundiced.  Neurological:     General: No focal deficit present.     Mental Status: She is alert and oriented to person, place, and time.  Psychiatric:        Mood and Affect: Mood normal.        Behavior: Behavior normal.    Assessment & Plan:   Problem List Items Addressed This Visit       GERD (gastroesophageal reflux disease)    Symptoms well controlled with omeprazole 20 mg daily.      Hypothyroidism    She endorses a history of hypothyroidism and is currently prescribed levothyroxine 75 mcg daily.  She is asymptomatic currently.  No changes today.      Type 2 diabetes mellitus ([place]) - Primary    She reports that her most recent HgbA1c 8.0.  She is currently prescribed Tresiba 10 units twice daily and glipizide 5 mg twice daily.  She endorses early morning lows reaching high 50s. -Decrease frequency of glipizide 5 mg every morning.  We will stop her evening dose. -Continue Tresiba at current dose -Repeat A1c ordered today -Address outstanding diabetes related health maintenance items at follow-up in 4 weeks      Hyperlipidemia associated with type 2 diabetes mellitus ([place])    Currently prescribed atorvastatin 10 mg every other day.  Repeat lipid panel ordered today.      Restless legs    She is currently prescribed Requip for nightly  use, which is effective in relieving her symptoms.      Vitamin D deficiency    Currently on vitamin D supplementation.  Repeat vitamin D level ordered today.      Anxiety and depression    Mood stable currently.  She is prescribed sertraline 25 mg nightly and takes Xanax 0.25 mg as needed for anxiety related.      Stress incontinence, female    Currently prescribed Vesicare for treatment of her urge/stress incontinence, however she currently endorses blurred vision that she has associated with Vesicare.  She would like to stop the medication today. -Vesicare has been discontinued      HSV (herpes simplex virus) infection    Currently prescribed Valtrex 500 mg every morning.  No changes today.      Encounter for general adult medical examination with abnormal findings    Presenting today to establish care.  Previous records and labs were reviewed. -Baseline labs have been ordered today -Follow-up in 4 weeks and address outstanding preventative care items at that time.      Return in about 4 weeks (around 02/12/2022) for DM, lab review, annual exam.   Johnette Abraham, MD

## 2022-01-15 NOTE — Patient Instructions (Signed)
It was a pleasure to see you today.  Thank you for giving [place] the opportunity to be involved in your care.  Below is a brief recap of your visit and next steps.  We will plan to see you again in 4 weeks.  Summary You have established care today. We will check labs I recommend just taking glipizide in the morning and not at night Follow up in 4 weeks

## 2022-01-16 ENCOUNTER — Encounter: Payer: Self-pay | Admitting: Orthopaedic Surgery

## 2022-01-16 ENCOUNTER — Ambulatory Visit: Payer: Medicare Other | Admitting: Orthopaedic Surgery

## 2022-01-16 VITALS — BP 122/70 | HR 86 | Ht 61.0 in | Wt 113.0 lb

## 2022-01-16 DIAGNOSIS — G8929 Other chronic pain: Secondary | ICD-10-CM

## 2022-01-16 DIAGNOSIS — M25562 Pain in left knee: Secondary | ICD-10-CM | POA: Diagnosis not present

## 2022-01-16 LAB — HCV INTERPRETATION

## 2022-01-16 LAB — HCV AB W REFLEX TO QUANT PCR: HCV Ab: NONREACTIVE

## 2022-01-16 NOTE — Patient Instructions (Signed)
Central Scheduling [phone]  While we are working on your approval please go ahead and call to schedule your appointment to be done within one week. If you can not get an appointment at [facility] within the next week, ask if they have something sooner at [facility] or [facility] if you are able to go to [facility] to have the imaging done.  AFTER you have made your imaging appointment, please call our office back at [phone] to schedule an appointment to review your results.  CURRENTLY [employer] ONLY AUTHORIZES IMAGING TO BE PERFORMED AT [facility]. THEY WILL NOT APPROVE ANY OTHER FACILITY  [facility] Address: [address] [facility] [address], [place], [place] [address] Phone: [phone]   [facility] [address] [place], [place] [address] Phone: [phone]  

## 2022-01-16 NOTE — Progress Notes (Signed)
My knee still hurts.  She had only a day or two help with the injection last visit. She has pain of the left knee, swelling, popping and feeling of instability.  She has no new trauma, no redness.  Left knee has effusion, crepitus, medial pain, ROM 0 to 105, limp left, positive medial [place], NV intact, no distal edema.  Encounter Diagnosis  Name Primary?   Chronic pain of left knee Yes   I will get MRI as she is not improving.  I will let her try brace and see if she likes it.  Use rubs to knee as desired.  Return in three weeks.  Call if any problem.  Precautions discussed.  Electronically Signed Darreld Mclean, MD 11/28/20232:16 PM

## 2022-01-17 DIAGNOSIS — E1169 Type 2 diabetes mellitus with other specified complication: Secondary | ICD-10-CM | POA: Insufficient documentation

## 2022-01-17 DIAGNOSIS — Z0001 Encounter for general adult medical examination with abnormal findings: Secondary | ICD-10-CM | POA: Insufficient documentation

## 2022-01-17 DIAGNOSIS — F32A Depression, unspecified: Secondary | ICD-10-CM | POA: Insufficient documentation

## 2022-01-17 DIAGNOSIS — B009 Herpesviral infection, unspecified: Secondary | ICD-10-CM | POA: Insufficient documentation

## 2022-01-17 DIAGNOSIS — N393 Stress incontinence (female) (male): Secondary | ICD-10-CM | POA: Insufficient documentation

## 2022-01-17 DIAGNOSIS — F419 Anxiety disorder, unspecified: Secondary | ICD-10-CM | POA: Insufficient documentation

## 2022-01-17 LAB — CMP14+EGFR
ALT: 18 IU/L (ref 0–32)
AST: 17 IU/L (ref 0–40)
Albumin/Globulin Ratio: 1.8 (ref 1.2–2.2)
Albumin: 4.3 g/dL (ref 3.8–4.8)
Alkaline Phosphatase: 47 IU/L (ref 44–121)
BUN/Creatinine Ratio: 23 (ref 12–28)
BUN: 16 mg/dL (ref 8–27)
Bilirubin Total: 0.3 mg/dL (ref 0.0–1.2)
CO2: 24 mmol/L (ref 20–29)
Calcium: 9.9 mg/dL (ref 8.7–10.3)
Chloride: 105 mmol/L (ref 96–106)
Creatinine, Ser: 0.7 mg/dL (ref 0.57–1.00)
Globulin, Total: 2.4 g/dL (ref 1.5–4.5)
Glucose: 139 mg/dL — ABNORMAL HIGH (ref 70–99)
Potassium: 4.4 mmol/L (ref 3.5–5.2)
Sodium: 141 mmol/L (ref 134–144)
Total Protein: 6.7 g/dL (ref 6.0–8.5)
eGFR: 90 mL/min/{1.73_m2} (ref >59)

## 2022-01-17 LAB — TSH+FREE T4
Free T4: 1.5 ng/dL (ref 0.82–1.77)
TSH: 1.59 u[IU]/mL (ref 0.450–4.500)

## 2022-01-17 LAB — LIPID PANEL
Chol/HDL Ratio: 3.6 ratio (ref 0.0–4.4)
Cholesterol, Total: 206 mg/dL — ABNORMAL HIGH (ref 100–199)
HDL: 57 mg/dL (ref >39)
LDL Chol Calc (NIH): 126 mg/dL — ABNORMAL HIGH (ref 0–99)
Triglycerides: 131 mg/dL (ref 0–149)
VLDL Cholesterol Cal: 23 mg/dL (ref 5–40)

## 2022-01-17 LAB — B12 AND FOLATE PANEL
Folate: 10.6 ng/mL (ref >3.0)
Vitamin B-12: 397 pg/mL (ref 232–1245)

## 2022-01-17 LAB — CBC WITH DIFFERENTIAL/PLATELET
Basophils Absolute: 0 10*3/uL (ref 0.0–0.2)
Basos: 0 %
EOS (ABSOLUTE): 0.1 10*3/uL (ref 0.0–0.4)
Eos: 1 %
Hematocrit: 42.6 % (ref 34.0–46.6)
Hemoglobin: 14.2 g/dL (ref 11.1–15.9)
Immature Grans (Abs): 0 10*3/uL (ref 0.0–0.1)
Immature Granulocytes: 0 %
Lymphocytes Absolute: 2.4 10*3/uL (ref 0.7–3.1)
Lymphs: 32 %
MCH: 29.8 pg (ref 26.6–33.0)
MCHC: 33.3 g/dL (ref 31.5–35.7)
MCV: 89 fL (ref 79–97)
Monocytes Absolute: 0.4 10*3/uL (ref 0.1–0.9)
Monocytes: 5 %
Neutrophils Absolute: 4.7 10*3/uL (ref 1.4–7.0)
Neutrophils: 62 %
Platelets: 236 10*3/uL (ref 150–450)
RBC: 4.77 x10E6/uL (ref 3.77–5.28)
RDW: 13.4 % (ref 11.7–15.4)
WBC: 7.6 10*3/uL (ref 3.4–10.8)

## 2022-01-17 LAB — HEMOGLOBIN A1C
Est. average glucose Bld gHb Est-mCnc: 197 mg/dL
Hgb A1c MFr Bld: 8.5 % — ABNORMAL HIGH (ref 4.8–5.6)

## 2022-01-17 LAB — VITAMIN D 25 HYDROXY (VIT D DEFICIENCY, FRACTURES): Vit D, 25-Hydroxy: 31.5 ng/mL (ref 30.0–100.0)

## 2022-01-17 NOTE — Assessment & Plan Note (Signed)
Currently prescribed Vesicare for treatment of her urge/stress incontinence, however she currently endorses blurred vision that she has associated with Vesicare.  She would like to stop the medication today. -Vesicare has been discontinued

## 2022-01-17 NOTE — Assessment & Plan Note (Signed)
She reports that her most recent HgbA1c 8.0.  She is currently prescribed Tresiba 10 units twice daily and glipizide 5 mg twice daily.  She endorses early morning lows reaching high 50s. -Decrease frequency of glipizide 5 mg every morning.  We will stop her evening dose. -Continue Tresiba at current dose -Repeat A1c ordered today -Address outstanding diabetes related health maintenance items at follow-up in 4 weeks

## 2022-01-17 NOTE — Assessment & Plan Note (Signed)
Currently prescribed atorvastatin 10 mg every other day.  Repeat lipid panel ordered today.

## 2022-01-17 NOTE — Assessment & Plan Note (Signed)
She endorses a history of hypothyroidism and is currently prescribed levothyroxine 75 mcg daily.  She is asymptomatic currently.  No changes today.

## 2022-01-17 NOTE — Assessment & Plan Note (Signed)
Currently on vitamin D supplementation.  Repeat vitamin D level ordered today. 

## 2022-01-17 NOTE — Assessment & Plan Note (Signed)
Mood stable currently.  She is prescribed sertraline 25 mg nightly and takes Xanax 0.25 mg as needed for anxiety related.

## 2022-01-17 NOTE — Assessment & Plan Note (Signed)
She is currently prescribed Requip for nightly use, which is effective in relieving her symptoms.

## 2022-01-17 NOTE — Assessment & Plan Note (Signed)
Currently prescribed Valtrex 500 mg every morning.  No changes today.

## 2022-01-17 NOTE — Assessment & Plan Note (Signed)
Symptoms well controlled with omeprazole 20 mg daily.

## 2022-01-17 NOTE — Assessment & Plan Note (Signed)
Presenting today to establish care.  Previous records and labs were reviewed. -Baseline labs have been ordered today -Follow-up in 4 weeks and address outstanding preventative care items at that time.

## 2022-01-19 ENCOUNTER — Other Ambulatory Visit: Payer: Self-pay

## 2022-01-19 MED ORDER — ACCU-CHEK AVIVA PLUS VI STRP: 1.0000 | ORAL_STRIP | Freq: Three times a day (TID) | 10 refills | Status: DC

## 2022-01-25 ENCOUNTER — Telehealth: Payer: Self-pay | Admitting: Internal Medicine

## 2022-01-25 ENCOUNTER — Other Ambulatory Visit: Payer: Self-pay

## 2022-01-25 MED ORDER — GLIPIZIDE ER 5 MG PO TB24: 5.0000 mg | ORAL_TABLET | Freq: Every day | ORAL | 0 refills | Status: DC

## 2022-01-25 NOTE — Telephone Encounter (Signed)
Patient is requesting refill on   glipiZIDE (GLUCOTROL XL) 5 MG 24 hr tablet     [facility]

## 2022-01-26 ENCOUNTER — Ambulatory Visit (HOSPITAL_BASED_OUTPATIENT_CLINIC_OR_DEPARTMENT_OTHER)
Admission: RE | Admit: 2022-01-26 | Discharge: 2022-01-26 | Disposition: A | Discharge status: Home / Self Care | Payer: Medicare Other | Source: Ambulatory Visit | Attending: Orthopaedic Surgery | Admitting: Orthopaedic Surgery

## 2022-01-26 ENCOUNTER — Telehealth: Payer: Self-pay | Admitting: Internal Medicine

## 2022-01-26 DIAGNOSIS — M25562 Pain in left knee: Secondary | ICD-10-CM | POA: Diagnosis not present

## 2022-01-26 DIAGNOSIS — X58XXXA Exposure to other specified factors, initial encounter: Secondary | ICD-10-CM | POA: Diagnosis not present

## 2022-01-26 DIAGNOSIS — G8929 Other chronic pain: Secondary | ICD-10-CM

## 2022-01-26 DIAGNOSIS — R609 Edema, unspecified: Secondary | ICD-10-CM | POA: Insufficient documentation

## 2022-01-26 DIAGNOSIS — S83242A Other tear of medial meniscus, current injury, left knee, initial encounter: Secondary | ICD-10-CM | POA: Diagnosis not present

## 2022-01-26 DIAGNOSIS — M1712 Unilateral primary osteoarthritis, left knee: Secondary | ICD-10-CM | POA: Insufficient documentation

## 2022-01-26 NOTE — Telephone Encounter (Signed)
Letter printed.

## 2022-01-26 NOTE — Telephone Encounter (Signed)
Patient needs letter about her health condition regarding the diabetic so she can turn into medicare to straighten to check. [place] contact patient any questions [phone] and call when letter is ready.

## 2022-01-30 ENCOUNTER — Ambulatory Visit: Payer: Medicare Other | Admitting: Orthopaedic Surgery

## 2022-01-30 ENCOUNTER — Encounter: Payer: Self-pay | Admitting: Orthopaedic Surgery

## 2022-01-30 ENCOUNTER — Telehealth: Payer: Self-pay | Admitting: Orthopedic Surgery

## 2022-01-30 VITALS — BP 138/52 | HR 66

## 2022-01-30 DIAGNOSIS — M25562 Pain in left knee: Secondary | ICD-10-CM | POA: Diagnosis not present

## 2022-01-30 DIAGNOSIS — G8929 Other chronic pain: Secondary | ICD-10-CM

## 2022-01-30 NOTE — Progress Notes (Signed)
My knee hurts.  She had MRI of the left knee and it showed: IMPRESSION: 1. Horizontal oblique tear of the posterior horn/body junction of the medial meniscus. 2. Mild edema about the medial collateral ligament suggesting ligamentous sprain without tear. 3. Mild medial tibiofemoral and patellofemoral osteoarthritis. 4. Small Baker's cyst measuring approximately 1.3 x 0.6 x 2.2 cm. 5. Mild patellar insertional tendinopathy without tear. 6. No evidence of fracture or osteonecrosis.  I have explained the findings to her.  I have shown a model of the knee to her and her husband and also the MRI views.  I have independently reviewed the MRI.    I will have her see Dr. Romeo Apple for further evaluation.  She has diabetes and last A1C was 8.7.  Her doctor is changing her diabetes medicine.  Left knee has brace, pain, effusion, crepitus, ROM 0 to 105 with medial pain, positive medial Mc[place]ray, NV intact, no distal edema.  Encounter Diagnosis  Name Primary?   Chronic pain of left knee Yes   To see Dr. Romeo Apple.  Call if any problem.  Precautions discussed.  Electronically Signed Darreld Mclean, MD 12/12/202310:50 AM

## 2022-01-30 NOTE — Patient Instructions (Addendum)
We will get you scheduled to see Dr.Harrison in our office to discuss the possibility of having a knee scope due to a torn meniscus in your knee.

## 2022-01-30 NOTE — Telephone Encounter (Signed)
Patient lvm requesting Dr. Romeo Apple call her back.  She saw Dr. Hilda Lias this morning and Dr. Hilda Lias is referring to Dr. Romeo Apple.  The patient stated that she saw Dr. Romeo Apple in a jewelry store a few months ago and that he told her if she had to wait a month to see him for her to call him and speak with him.  She wants to talk to him about having to wait a month for her consultation.  Phone # [phone]

## 2022-01-31 ENCOUNTER — Ambulatory Visit: Payer: Medicare Other | Admitting: Family

## 2022-01-31 NOTE — Telephone Encounter (Signed)
I used a workin spot and called patient, sent Karen Horn a message, if she wants to open back up the workin spot she can tell me where to move patient  Thanks / Lorain Childes

## 2022-02-01 ENCOUNTER — Ambulatory Visit (INDEPENDENT_AMBULATORY_CARE_PROVIDER_SITE_OTHER): Payer: Medicare Other | Admitting: Gastroenterology

## 2022-02-01 IMAGING — MR MR HEAD W/O CM
11 of 12 series · 43 of 48 positions shown · non-contrast
Comparison: CT head February 25, 2020.

CLINICAL DATA: Neuro deficit, acute stroke suspected. Right leg
numbness and possible weakness.

EXAM:
MRI HEAD WITHOUT CONTRAST
TECHNIQUE: Multiplanar, multiecho pulse sequences of the brain and surrounding
structures were obtained without intravenous contrast.

[Series 5: DWI · axial · 3.0mm · 0.77mm/px · z∈[-53,+93]mm · 4 of 50 slices shown (1 of 4)]
[im 1/50]
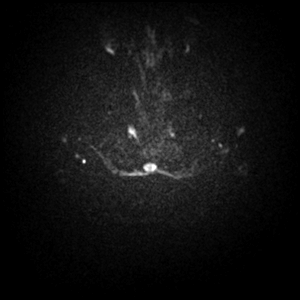
[im 17/50]
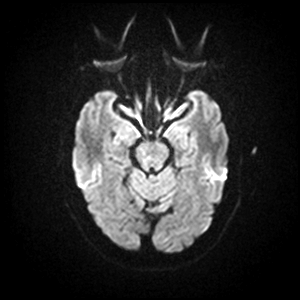
[im 33/50]
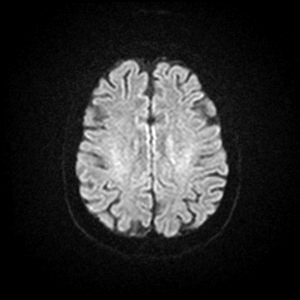
[im 50/50]
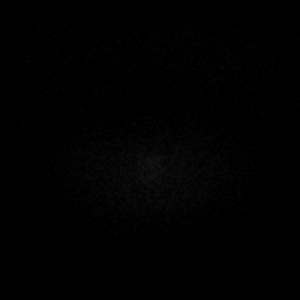

[Series 6: DWI · axial · 3.0mm · 0.77mm/px · z∈[-53,+93]mm · 5 of 50 slices shown (2 of 4)]
[im 1/50]
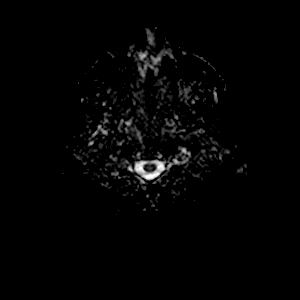
[im 13/50]
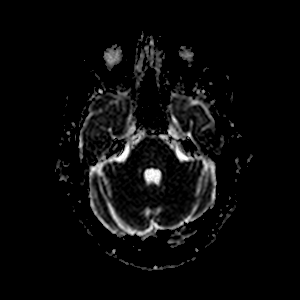
[im 25/50]
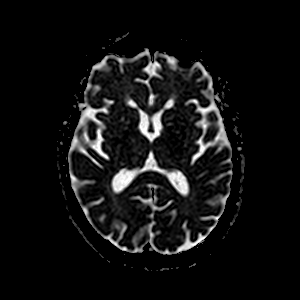
[im 37/50]
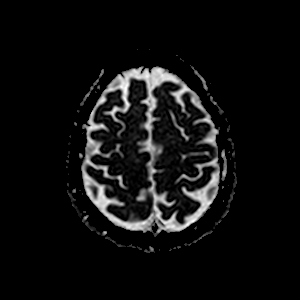
[im 50/50]
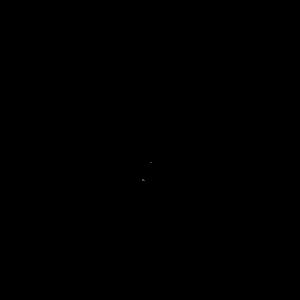

[Series 7: DWI · coronal · 5.0mm · 0.88mm/px · 3 of 28 slices shown (3 of 4)]
[im 1/28]
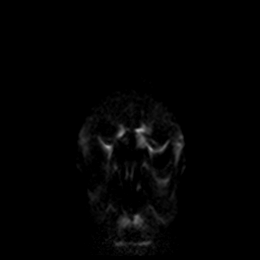
[im 14/28]
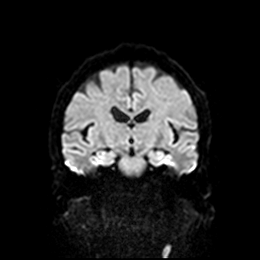
[im 28/28]
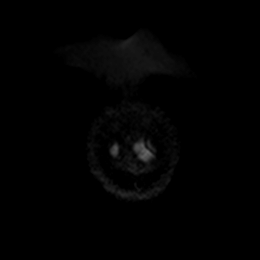

[Series 8: DWI · coronal · 5.0mm · 0.88mm/px · 3 of 28 slices shown (4 of 4)]
[im 1/28]
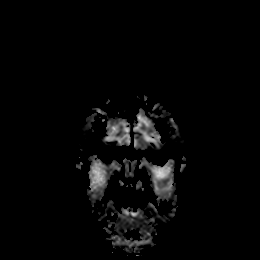
[im 14/28]
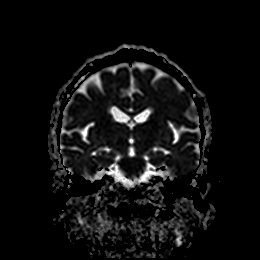
[im 28/28]
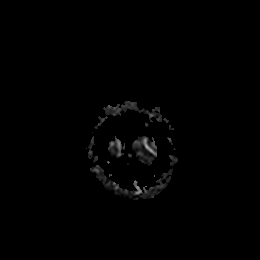

[Series 9: T1 · sagittal · 5.0mm · 0.75mm/px · 2 of 21 slices shown]
[im 1/21]
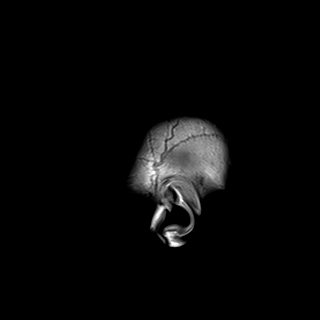
[im 21/21]
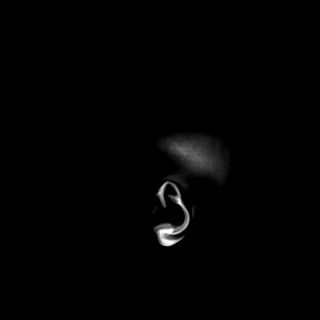

[Series 10: T2 · axial · 5.0mm · 0.72mm/px · z∈[-56,+97]mm · 2 of 23 slices shown (1 of 2)]
[im 1/23]
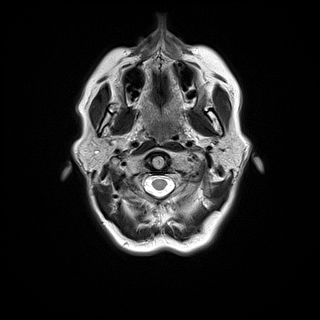
[im 23/23]
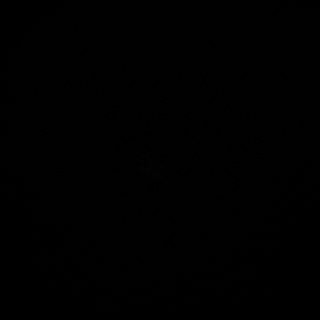

[Series 11: mag_images · axial · 3.0mm · 0.90mm/px · z∈[-66,+110]mm · 6 of 60 slices shown]
[im 1/60]
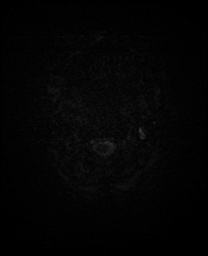
[im 12/60]
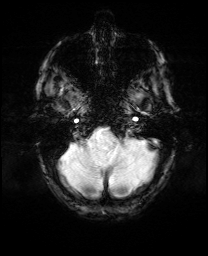
[im 24/60]
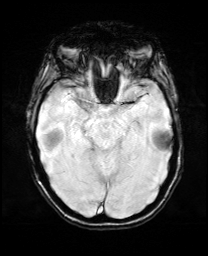
[im 36/60]
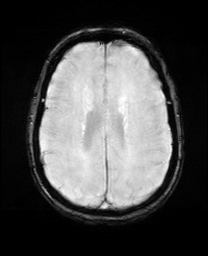
[im 48/60]
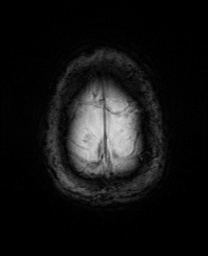
[im 60/60]
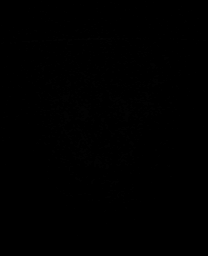

[Series 12: pha_images · axial · 3.0mm · 0.90mm/px · z∈[-63,+101]mm · 5 of 56 slices shown]
[im 1/56]
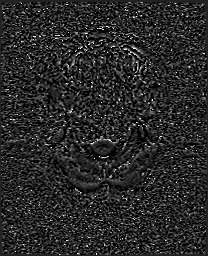
[im 14/56]
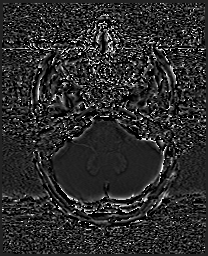
[im 28/56]
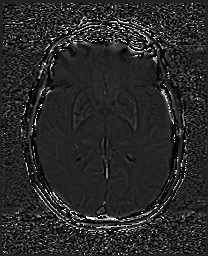
[im 42/56]
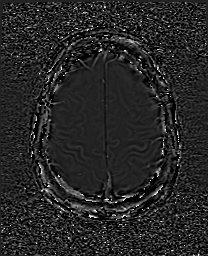
[im 56/56]
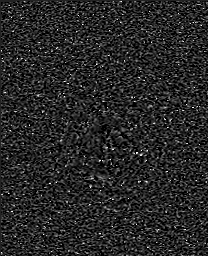

[Series 13: swi_images · axial · 3.0mm · 0.90mm/px · z∈[-66,+110]mm · 6 of 60 slices shown]
[im 1/60]
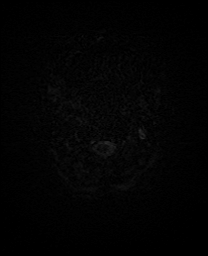
[im 12/60]
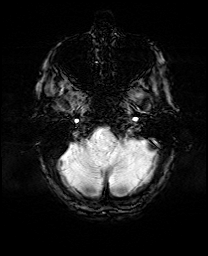
[im 24/60]
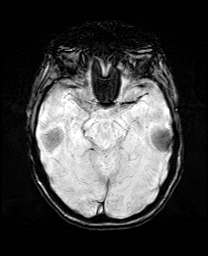
[im 36/60]
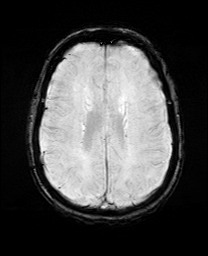
[im 48/60]
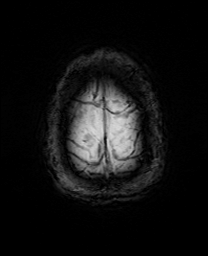
[im 60/60]
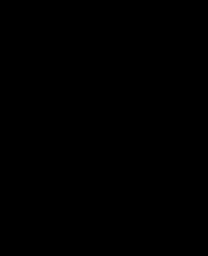

[Series 20: FLAIR · axial · 4.0mm · 0.43mm/px · z∈[-48,+100]mm · 4 of 38 slices shown]
[im 1/38]
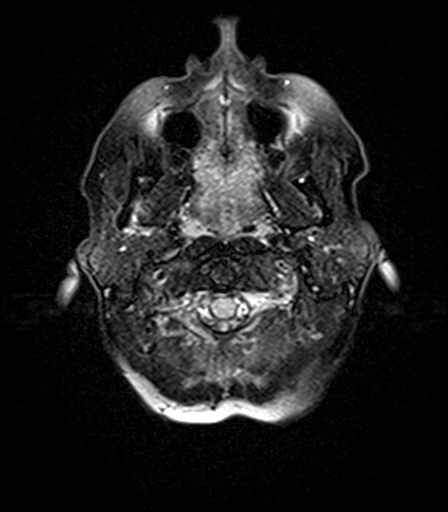
[im 13/38]
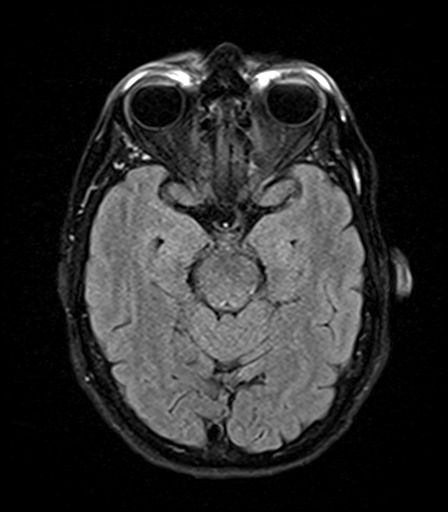
[im 25/38]
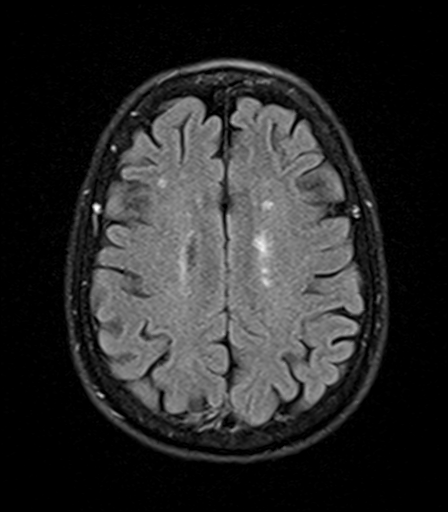
[im 38/38]
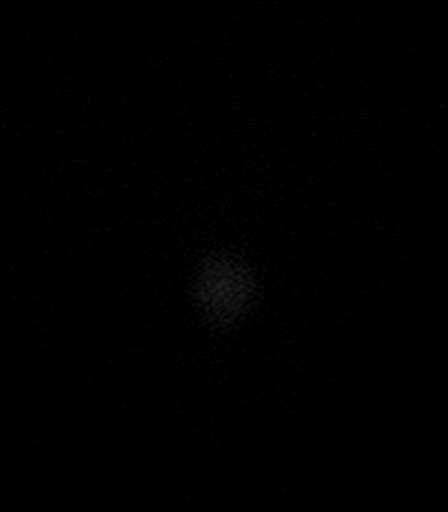

[Series 21: T2 · coronal · 5.0mm · 0.72mm/px · 3 of 28 slices shown (2 of 2)]
[im 1/28]
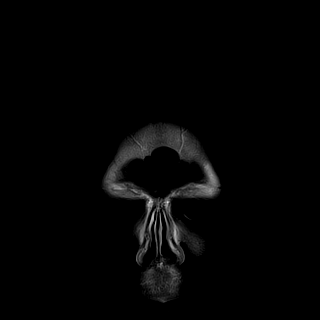
[im 14/28]
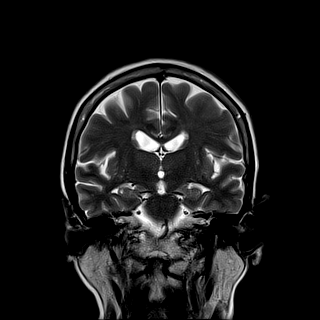
[im 28/28]
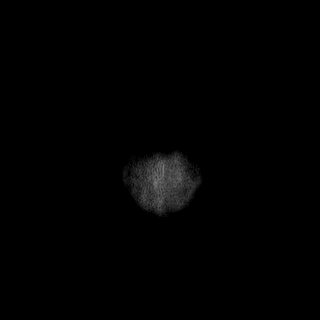

[43 of 48 positions shown; findings below may reference images not displayed]

FINDINGS: Brain: No acute infarct. No hydrocephalus. Mild for age scattered
T2/FLAIR hyperintensities within the white matter, nonspecific but
most likely related to chronic microvascular ischemic disease. No
acute hemorrhage. There are a few punctate foci of susceptibility
artifact in the inferior right frontal lobe, likely the sequela of
prior microhemorrhage.

Vascular: Major arterial flow voids are maintained at the skull
base.

Skull and upper cervical spine: Normal marrow signal. Partially
imaged upper cervical degenerative change.

Sinuses/Orbits: Visualized sinuses are clear.  Unremarkable orbits.

Other: No mastoid effusions.
IMPRESSION: No evidence of acute intracranial abnormality. Specifically, no
acute infarct.

## 2022-02-02 ENCOUNTER — Other Ambulatory Visit (HOSPITAL_COMMUNITY): Payer: Self-pay | Admitting: Internal Medicine

## 2022-02-02 ENCOUNTER — Ambulatory Visit (HOSPITAL_COMMUNITY)
Admission: RE | Admit: 2022-02-02 | Discharge: 2022-02-02 | Disposition: A | Discharge status: Home / Self Care | Payer: Medicare Other | Source: Ambulatory Visit | Attending: Adult Health Nurse Practitioner | Admitting: Adult Health Nurse Practitioner

## 2022-02-02 DIAGNOSIS — Z1231 Encounter for screening mammogram for malignant neoplasm of breast: Secondary | ICD-10-CM | POA: Diagnosis not present

## 2022-02-06 ENCOUNTER — Encounter: Payer: Self-pay | Admitting: Family Medicine

## 2022-02-06 ENCOUNTER — Ambulatory Visit (INDEPENDENT_AMBULATORY_CARE_PROVIDER_SITE_OTHER): Payer: Medicare Other | Admitting: Family Medicine

## 2022-02-06 ENCOUNTER — Other Ambulatory Visit: Payer: Self-pay

## 2022-02-06 VITALS — BP 115/78 | HR 96 | Ht 60.5 in | Wt 113.1 lb

## 2022-02-06 DIAGNOSIS — R059 Cough, unspecified: Secondary | ICD-10-CM | POA: Diagnosis not present

## 2022-02-06 DIAGNOSIS — J329 Chronic sinusitis, unspecified: Secondary | ICD-10-CM

## 2022-02-06 MED ORDER — GUAIFENESIN 100 MG/5ML PO LIQD: 5.0000 mL | ORAL | 0 refills | Status: DC | PRN

## 2022-02-06 MED ORDER — AMOXICILLIN-POT CLAVULANATE 875-125 MG PO TABS: 1.0000 | ORAL_TABLET | Freq: Two times a day (BID) | ORAL | 0 refills | Status: AC

## 2022-02-06 MED ORDER — PROMETHAZINE-DM 6.25-15 MG/5ML PO SYRP: 5.0000 mL | ORAL_SOLUTION | Freq: Four times a day (QID) | ORAL | 0 refills | Status: DC | PRN

## 2022-02-06 MED ORDER — PROMETHAZINE-DM 6.25-15 MG/5ML PO SYRP: 1.2500 mL | ORAL_SOLUTION | Freq: Four times a day (QID) | ORAL | 0 refills | Status: DC | PRN

## 2022-02-06 NOTE — Progress Notes (Unsigned)
Acute Office Visit  Subjective:    Patient ID: Karen Horn, female    DOB: 02/18/1946, 76 y.o.   MRN: [id number]  Chief Complaint  Patient presents with   Sinus Problem    Pt reports cough, drainage, coughing up green mucus, ear pain and headache. Onset of sx started on 01/29/2022, had a covid test at home last night was Neg.    HPI Patient is in today with complaints of nasal congestion, purulent phlegm, earache, facial pressure and headaches.  Onset of symptoms 01/29/2022.  She denies sore throat, fever and chills.  She tested negative for COVID at home.  Past Medical History:  Diagnosis Date   Diabetes mellitus without complication ([place])    Thyroid disease     Past Surgical History:  Procedure Laterality Date   ABDOMINAL HYSTERECTOMY     ADENOIDECTOMY     BACK SURGERY     BIOPSY  09/06/2021   Procedure: BIOPSY;  Surgeon: Montez Morita, Quillian Quince, MD;  Location: [facility];  Service: Gastroenterology;;   ESOPHAGOGASTRODUODENOSCOPY (EGD) WITH PROPOFOL N/A 09/06/2021   Procedure: ESOPHAGOGASTRODUODENOSCOPY (EGD) WITH PROPOFOL;  Surgeon: Harvel Quale, MD;  Location: [facility];  Service: Gastroenterology;  Laterality: N/A;  215 ASA 2   SINOSCOPY     TONSILLECTOMY      History reviewed. No pertinent family history.  Social History   Socioeconomic History   Marital status: Married    Spouse name: Not on file   Number of children: Not on file   Years of education: Not on file   Highest education level: Not on file  Occupational History   Not on file  Tobacco Use   Smoking status: Never   Smokeless tobacco: Never  Vaping Use   Vaping Use: Never used  Substance and Sexual Activity   Alcohol use: Not Currently   Drug use: Never   Sexual activity: Not on file  Other Topics Concern   Not on file  Social History Narrative   Not on file   Social Determinants of Health   Financial Resource Strain: Not on file  Food Insecurity: Not on file   Transportation Needs: Not on file  Physical Activity: Not on file  Stress: Not on file  Social Connections: Not on file  Intimate Partner Violence: Not on file    Outpatient Medications Prior to Visit  Medication Sig Dispense Refill   ACCU-CHEK AVIVA PLUS test strip 1 each by Other route 3 (three) times daily. 100 each 10   albuterol (VENTOLIN HFA) 108 (90 Base) MCG/ACT inhaler Inhale into the lungs.     ALPRAZolam (XANAX) 0.5 MG tablet Take 0.25 mg by mouth daily as needed for anxiety.     aspirin 81 MG chewable tablet Chew 81 mg by mouth in the morning.     atorvastatin (LIPITOR) 10 MG tablet Take 10 mg by mouth every Monday, Wednesday, and Friday.     Blood Glucose Monitoring Suppl (ACCU-CHEK AVIVA PLUS) w/Device KIT by Does not apply route.     cholecalciferol (VITAMIN D) 25 MCG (1000 UNIT) tablet Take 2,000 Units by mouth in the morning.     EPINEPHrine 0.3 mg/0.3 mL IJ SOAJ injection epinephrine 0.3 mg/0.3 mL injection, auto-injector (Patient taking differently: Inject 0.3 mg into the muscle as needed for anaphylaxis.) 1 each 1   fluticasone (FLONASE) 50 MCG/ACT nasal spray Place into the nose.     glipiZIDE (GLUCOTROL XL) 5 MG 24 hr tablet Take 1 tablet (5 mg  total) by mouth daily with breakfast. Take 5 mg by mouth daily with breakfast. 90 tablet 0   glucose blood (ACCU-CHEK AVIVA PLUS) test strip TEST SUGAR 3-4 TIMES A DAY     insulin degludec (TRESIBA FLEXTOUCH) 200 UNIT/ML FlexTouch Pen Inject 10 Units into the skin 2 (two) times daily.     Insulin Pen Needle (SURE COMFORT PEN NEEDLES) 31G X 5 MM MISC Sure Comfort Pen Needle 31 gauge x 5/16"     Lactobacillus (PROBIOTIC ACIDOPHILUS) CAPS Take 1 capsule by mouth in the morning.     levothyroxine (SYNTHROID) 75 MCG tablet Take 75 mcg by mouth daily before breakfast.     omeprazole (PRILOSEC) 20 MG capsule Take 1 capsule (20 mg total) by mouth daily. 90 capsule 3   rOPINIRole (REQUIP) 0.25 MG tablet Take 0.25 mg by mouth at bedtime.      sertraline (ZOLOFT) 25 MG tablet Take 25 mg by mouth at bedtime.     valACYclovir (VALTREX) 500 MG tablet Take 500 mg by mouth in the morning.     No facility-administered medications prior to visit.    Allergies  Allergen Reactions   Codeine Rash    Other reaction(s): Chest Pain   Gabapentin Other (See Comments)    Other reaction(s): Other Depression, suicidal thoughts   Sulfa Antibiotics Rash and Other (See Comments)    chest Pain   Atorvastatin Calcium Other (See Comments)    Other reaction(s): Other myalgia   Escitalopram Other (See Comments)    Other reaction(s): Other Headaches   Ezetimibe Other (See Comments)    Other reaction(s): Other Myalgia-patient wants to retry generic 10/17   Fluvastatin Other (See Comments)    Other reaction(s): Other myalgia   Fosamax [Alendronate]     Other reaction(s): Other Leg pain   Levofloxacin Other (See Comments)    Other reaction(s): Other Muscle aching in her shoulders, inner thighs and legs   Pramipexole Nausea Only and Other (See Comments)    Headache    Prednisone Other (See Comments)    Unknown reaction type   Rosuvastatin Calcium Other (See Comments)    Other reaction(s): Other myalgia   Sertraline Other (See Comments)    Other reaction(s): Other Headaches   Simvastatin Other (See Comments)    Other reaction(s): Other myalgia   Trazodone And Nefazodone Nausea Only    Review of Systems  Constitutional:  Negative for chills and fever.  HENT:  Positive for congestion and sinus pressure. Negative for sore throat.   Eyes:  Negative for visual disturbance.  Respiratory:  Positive for cough. Negative for chest tightness, shortness of breath and wheezing.   Neurological:  Positive for headaches. Negative for dizziness.       Objective:    Physical Exam HENT:     Head: Normocephalic.     Nose:     Right Sinus: Maxillary sinus tenderness and frontal sinus tenderness present.     Left Sinus: Maxillary sinus  tenderness and frontal sinus tenderness present.     Mouth/Throat:     Mouth: Mucous membranes are moist.  Cardiovascular:     Rate and Rhythm: Normal rate.     Heart sounds: Normal heart sounds.  Pulmonary:     Effort: Pulmonary effort is normal.     Breath sounds: Normal breath sounds.  Neurological:     Mental Status: She is alert.     BP 115/78   Pulse 96   Ht 5' 0.5" (1.537 m)   Wt  113 lb 1.3 oz (51.3 kg)   SpO2 97%   BMI 21.72 kg/m  Wt Readings from Last 3 Encounters:  02/06/22 113 lb 1.3 oz (51.3 kg)  01/16/22 113 lb (51.3 kg)  01/15/22 113 lb 6.4 oz (51.4 kg)       Assessment & Plan:  Rhinosinusitis Assessment & Plan: Will treat with Augmentin for 5 days Promethazine DM prescribed for cough Encouraged to stay well-hydrated Encouraged use of a heated humidifier to open up her sinuses Encouraged use of Tylenol as needed for headaches and bodyaches  Orders: -     Amoxicillin-Pot Clavulanate; Take 1 tablet by mouth 2 (two) times daily for 5 days.  Dispense: 10 tablet; Refill: 0  Cough in adult -     Promethazine-DM; Take 5 mLs by mouth 4 (four) times daily as needed for cough.  Dispense: 118 mL; Refill: 0    Alvira Monday, FNP

## 2022-02-06 NOTE — Patient Instructions (Addendum)
I appreciate the opportunity to provide care to you today!    Follow up:  Dr. Jaci Lazier  Please pick up your medication at the pharmacy  A prescription for Robitussin has been sent to your pharmacy for cough and congestion A prescription for Augmentin has been sent to your pharmacy treat acute bacterial sinusitis I recommend taking Tylenol as needed for headaches or bodyaches I recommend staying well-hydrated by drinking at least 64 ounces of fluids daily I recommend use of heated humidifier at home to open up your sinuses    Please continue to a heart-healthy diet and increase your physical activities. Try to exercise for at least three times a week.      It was a pleasure to see you and I look forward to continuing to work together on your health and well-being. Please do not hesitate to call the office if you need care or have questions about your care.   Have a wonderful day and week. With Gratitude, Gilmore Laroche MSN, FNP-BC

## 2022-02-07 ENCOUNTER — Ambulatory Visit (INDEPENDENT_AMBULATORY_CARE_PROVIDER_SITE_OTHER): Payer: Medicare Other | Admitting: Orthopedic Surgery

## 2022-02-07 ENCOUNTER — Encounter: Payer: Self-pay | Admitting: Orthopedic Surgery

## 2022-02-07 VITALS — BP 115/78 | Ht 60.5 in | Wt 113.0 lb

## 2022-02-07 DIAGNOSIS — M1712 Unilateral primary osteoarthritis, left knee: Secondary | ICD-10-CM | POA: Diagnosis not present

## 2022-02-07 DIAGNOSIS — Z01818 Encounter for other preprocedural examination: Secondary | ICD-10-CM

## 2022-02-07 DIAGNOSIS — S83242A Other tear of medial meniscus, current injury, left knee, initial encounter: Secondary | ICD-10-CM

## 2022-02-07 DIAGNOSIS — J329 Chronic sinusitis, unspecified: Secondary | ICD-10-CM | POA: Insufficient documentation

## 2022-02-07 NOTE — Assessment & Plan Note (Signed)
Will treat with Augmentin for 5 days Promethazine DM prescribed for cough Encouraged to stay well-hydrated Encouraged use of a heated humidifier to open up her sinuses Encouraged use of Tylenol as needed for headaches and bodyaches

## 2022-02-07 NOTE — Progress Notes (Signed)
Chief Complaint  Patient presents with   Knee Pain      Left surgical consult Dr Keeling     76-year-old female sent to me by Dr. Keeling for evaluation of her left knee   Back in September she started having pain in the left knee associated with popping and swelling and giving way which has not resolved       She was treated with a cortisone injection, topical creams were recommended as well.  And she also had Tylenol, rest, bracing and ice   She says she cannot take the knee the way it is right now and does not want to deal with the medial pain for the rest of her life   She does have some medial soft tissue pain posteriorly and posteromedially involving the abductors and hamstrings   Review of systems nothing acutely at this time specifically no chest pain or shortness of breath easy bruising or bleeding       Past Medical History:  Diagnosis Date   Diabetes mellitus without complication ([facility])     Thyroid disease           Past Surgical History:  Procedure Laterality Date   ABDOMINAL HYSTERECTOMY       ADENOIDECTOMY       BACK SURGERY       BIOPSY   09/06/2021    Procedure: BIOPSY;  Surgeon: Castaneda Mayorga, Daniel, MD;  [facility]: [facility];  Service: Gastroenterology;;   ESOPHAGOGASTRODUODENOSCOPY (EGD) WITH PROPOFOL N/A 09/06/2021    Procedure: ESOPHAGOGASTRODUODENOSCOPY (EGD) WITH PROPOFOL;  Surgeon: Castaneda Mayorga, Daniel, MD;  Location: [facility];  Service: Gastroenterology;  Laterality: N/A;  215 ASA 2   SINOSCOPY       TONSILLECTOMY        No family history on file. Social History        Tobacco Use   Smoking status: Never   Smokeless tobacco: Never  Vaping Use   Vaping Use: Never used  Substance Use Topics   Alcohol use: Not Currently   Drug use: Never         Allergies  Allergen Reactions   Codeine Rash      Other reaction(s): Chest Pain   Gabapentin Other (See Comments)      Other reaction(s): Other Depression, suicidal thoughts    Sulfa Antibiotics Rash and Other (See Comments)      chest Pain   Atorvastatin Calcium Other (See Comments)      Other reaction(s): Other myalgia   Escitalopram Other (See Comments)      Other reaction(s): Other Headaches   Ezetimibe Other (See Comments)      Other reaction(s): Other Myalgia-patient wants to retry generic 10/17   Fluvastatin Other (See Comments)      Other reaction(s): Other myalgia   Fosamax [Alendronate]        Other reaction(s): Other Leg pain   Levofloxacin Other (See Comments)      Other reaction(s): Other Muscle aching in her shoulders, inner thighs and legs   Pramipexole Nausea Only and Other (See Comments)      Headache    Prednisone Other (See Comments)      Unknown reaction type   Rosuvastatin Calcium Other (See Comments)      Other reaction(s): Other myalgia   Sertraline Other (See Comments)      Other reaction(s): Other Headaches   Simvastatin Other (See Comments)      Other reaction(s): Other myalgia   Trazodone   And Nefazodone Nausea Only    BP 115/78 Comment: yesterday at PCP  Ht 5' 0.5" (1.537 m)   Wt 113 lb (51.3 kg)   BMI 21.71 kg/m  Physical Exam Vitals and nursing note reviewed.  Constitutional:      Appearance: Normal appearance.  HENT:     Head: Normocephalic and atraumatic.  Eyes:     General: No scleral icterus.       Right eye: No discharge.        Left eye: No discharge.     Extraocular Movements: Extraocular movements intact.     Conjunctiva/sclera: Conjunctivae normal.     Pupils: Pupils are equal, round, and reactive to light.  Cardiovascular:     Rate and Rhythm: Normal rate.     Pulses: Normal pulses.  Skin:    General: Skin is warm and dry.     Capillary Refill: Capillary refill takes less than 2 seconds.  Neurological:     General: No focal deficit present.     Mental Status: She is alert and oriented to person, place, and time.  Psychiatric:        Mood and Affect: Mood normal.        Behavior: Behavior  normal.        Thought Content: Thought content normal.        Judgment: Judgment normal.      Left knee evaluation   The patient exhibits full range of motion of the left knee she is tender in the posterior medial corner at the joint line she is also tender in the soft tissues around the medial side of the knee including the hamstrings.  The collateral ligaments feel stable the cruciate ligaments feel stable.  I do not detect tact an effusion.  She had some symptoms with McMurray's although it was a soft positive finding   I read the MRI as follows it does show a medial meniscal tear it shows some edema in the medial collateral ligament which may explain her medial soft tissue sleeve pain   We talked about addressing this with therapy after surgery   We will proceed with    arthroscopy of the left knee partial medial meniscectomy     The procedure has been fully reviewed with the patient; The risks and benefits of surgery have been discussed and explained and understood. Alternative treatment has also been reviewed, questions were encouraged and answered. The postoperative plan is also been reviewed. 

## 2022-02-07 NOTE — Patient Instructions (Addendum)
Your surgery will be at [facility] by Dr Harrison  The hospital will contact you with a preoperative appointment to discuss Anesthesia.  Please arrive on time or 15 minutes early for the preoperative appointment, they have a very tight schedule if you are late or do not come in your surgery will be cancelled.  The phone number is [phone]. Please bring your medications with you for the appointment. They will tell you the arrival time and medication instructions when you have your preoperative evaluation. Do not wear nail polish the day of your surgery and if you take Phentermine you need to stop this medication ONE WEEK prior to your surgery. If you take Invokana, Farxiga, Jardiance, or Steglatro) - Hold 72 hours before the procedure.  If you take Ozempic,  Bydureon or Trulicity do not take for 8 days before your surgery. If you take Victoza, Rybelsis, Saxenda or Adlyxi stop 24 hours before the procedure.  Please arrive at the hospital 2 hours before procedure if scheduled at 9:30 or later in the day or at the time the nurse tells you at your preoperative visit.   If you have my chart do not use the time given in my chart use the time given to you by the nurse during your preoperative visit.   Your surgery  time may change. Please be available for phone calls the day of your surgery and the day before. The Short Stay department may need to discuss changes about your surgery time. Not reaching the you could lead to procedure delays and possible cancellation.  You must have a ride home and someone to stay with you for 24 to 48 hours. The person taking you home will receive and sign for the your discharge instructions.  Please be prepared to give your support person's name and telephone number to [facility]. Dr Harrison will need that name and phone number post procedure.    Meniscus Injury, Arthroscopy   Arthroscopy is a surgical procedure that involves the use of a small scope that  has a camera and surgical instruments on the end (arthroscope). An arthroscope can be used to repair your meniscus injury.  LET YOUR HEALTH CARE PROVIDER KNOW ABOUT: Any allergies you have. All medicines you are taking, including vitamins, herbs, eyedrops, creams, and over-the-counter medicines. Any recent colds or infections you have had or currently have. Previous problems you or members of your family have had with the use of anesthetics. Any blood disorders or blood clotting problems you have. Previous surgeries you have had. Medical conditions you have. RISKS AND COMPLICATIONS Generally, this is a safe procedure. However, as with any procedure, problems can occur. Possible problems include: Damage to nerves or blood vessels. Excess bleeding. Blood clots. Infection. BEFORE THE PROCEDURE Do not eat or drink for 6-8 hours before the procedure. Take medicines as directed by your surgeon. Ask your surgeon about changing or stopping your regular medicines. You may have lab tests the morning of surgery. PROCEDURE  You will be given one of the following:  A medicine that numbs the area (local anesthesia). A medicine that makes you go to sleep (general anesthesia). A medicine injected into your spine that numbs your body below the waist (spinal anesthesia). Most often, several small cuts (incisions) are made in the knee. The arthroscope and instruments go into the incisions to repair the damage. The torn portion of the meniscus is removed.   AFTER THE PROCEDURE You will be taken to the recovery   area where your progress will be monitored. When you are awake, stable, and taking fluids without complications, you will be allowed to go home. This is usually the same day. A torn or stretched ligament (ligament sprain) may take 6-8 weeks to heal.  It takes about the 4-6 WEEKS if your surgeon removed a torn meniscus. A repaired meniscus may require 6-12 weeks of recovery time. A torn ligament  needing reconstructive surgery may take 6-12 months to heal fully.   This information is not intended to replace advice given to you by your health care provider. Make sure you discuss any questions you have with your health care provider. You have decided to proceed with operative arthroscopy of the knee. You have decided not to continue with nonoperative measures such as but not limited to oral medication, weight loss, activity modification, physical therapy, bracing, or injection.  We will perform operative arthroscopy of the knee. Some of the risks associated with arthroscopic surgery of the knee include but are not limited to Bleeding Infection Swelling Stiffness Blood clot Pain Need for knee replacement surgery    In compliance with recent  law in federal regulation regarding opioid use and abuse and addiction, we will taper (stop) opioid medication after 2 weeks.  If you're not comfortable with these risks and would like to continue with nonoperative treatment please let Dr. Harrison know prior to your surgery.  

## 2022-02-08 ENCOUNTER — Telehealth: Payer: Self-pay

## 2022-02-08 DIAGNOSIS — H0102B Squamous blepharitis left eye, upper and lower eyelids: Secondary | ICD-10-CM | POA: Diagnosis not present

## 2022-02-08 DIAGNOSIS — Z961 Presence of intraocular lens: Secondary | ICD-10-CM | POA: Diagnosis not present

## 2022-02-08 DIAGNOSIS — H02831 Dermatochalasis of right upper eyelid: Secondary | ICD-10-CM | POA: Diagnosis not present

## 2022-02-08 DIAGNOSIS — H02834 Dermatochalasis of left upper eyelid: Secondary | ICD-10-CM | POA: Diagnosis not present

## 2022-02-08 DIAGNOSIS — H43393 Other vitreous opacities, bilateral: Secondary | ICD-10-CM | POA: Diagnosis not present

## 2022-02-08 DIAGNOSIS — Z794 Long term (current) use of insulin: Secondary | ICD-10-CM | POA: Diagnosis not present

## 2022-02-08 LAB — HM DIABETES EYE EXAM

## 2022-02-08 NOTE — Telephone Encounter (Signed)
I called her to advise she voiced understanding  Sending back to you in case you need the number

## 2022-02-08 NOTE — Telephone Encounter (Signed)
Meniscus tear   She says the medial knee soft tissue has been hurting for years   So I recommended she still have the knee surgery   She also has OA of the hips seen by Eulah Pont; he rec THA when she was ready

## 2022-02-08 NOTE — Telephone Encounter (Signed)
Patient left message wanting for Dr. Romeo Apple to give her a call and discuss what other options she has besides having surgery.  Her number is [phone]

## 2022-02-08 NOTE — Telephone Encounter (Signed)
Tell her I call her after office hrs this evening

## 2022-02-15 ENCOUNTER — Ambulatory Visit (INDEPENDENT_AMBULATORY_CARE_PROVIDER_SITE_OTHER): Payer: Medicare Other | Admitting: Internal Medicine

## 2022-02-15 ENCOUNTER — Telehealth: Payer: Self-pay | Admitting: Orthopedic Surgery

## 2022-02-15 ENCOUNTER — Telehealth: Payer: Self-pay | Admitting: Internal Medicine

## 2022-02-15 ENCOUNTER — Encounter: Payer: Self-pay | Admitting: Internal Medicine

## 2022-02-15 VITALS — BP 118/68 | HR 76 | Ht 60.5 in | Wt 113.8 lb

## 2022-02-15 DIAGNOSIS — F419 Anxiety disorder, unspecified: Secondary | ICD-10-CM

## 2022-02-15 DIAGNOSIS — E039 Hypothyroidism, unspecified: Secondary | ICD-10-CM | POA: Diagnosis not present

## 2022-02-15 DIAGNOSIS — Z0001 Encounter for general adult medical examination with abnormal findings: Secondary | ICD-10-CM | POA: Diagnosis not present

## 2022-02-15 DIAGNOSIS — R0989 Other specified symptoms and signs involving the circulatory and respiratory systems: Secondary | ICD-10-CM | POA: Diagnosis not present

## 2022-02-15 DIAGNOSIS — F32A Depression, unspecified: Secondary | ICD-10-CM

## 2022-02-15 DIAGNOSIS — K580 Irritable bowel syndrome with diarrhea: Secondary | ICD-10-CM | POA: Diagnosis not present

## 2022-02-15 DIAGNOSIS — K219 Gastro-esophageal reflux disease without esophagitis: Secondary | ICD-10-CM

## 2022-02-15 DIAGNOSIS — R053 Chronic cough: Secondary | ICD-10-CM | POA: Diagnosis not present

## 2022-02-15 DIAGNOSIS — E119 Type 2 diabetes mellitus without complications: Secondary | ICD-10-CM | POA: Diagnosis not present

## 2022-02-15 DIAGNOSIS — B009 Herpesviral infection, unspecified: Secondary | ICD-10-CM

## 2022-02-15 MED ORDER — TIRZEPATIDE 2.5 MG/0.5ML ~~LOC~~ SOAJ: 2.5000 mg | SUBCUTANEOUS | 0 refills | Status: DC

## 2022-02-15 MED ORDER — HYDROCODONE BIT-HOMATROP MBR 5-1.5 MG/5ML PO SOLN: 5.0000 mL | Freq: Three times a day (TID) | ORAL | 0 refills | Status: DC | PRN

## 2022-02-15 MED ORDER — GLIPIZIDE ER 5 MG PO TB24: 5.0000 mg | ORAL_TABLET | Freq: Every day | ORAL | 0 refills | Status: DC

## 2022-02-15 MED ORDER — SERTRALINE HCL 25 MG PO TABS: 25.0000 mg | ORAL_TABLET | Freq: Every evening | ORAL | 1 refills | Status: DC

## 2022-02-15 MED ORDER — LEVOTHYROXINE SODIUM 75 MCG PO TABS: 75.0000 ug | ORAL_TABLET | Freq: Every day | ORAL | 0 refills | Status: DC

## 2022-02-15 MED ORDER — VALACYCLOVIR HCL 500 MG PO TABS: 500.0000 mg | ORAL_TABLET | Freq: Every morning | ORAL | 1 refills | Status: DC

## 2022-02-15 MED ORDER — OMEPRAZOLE 20 MG PO CPDR: 20.0000 mg | DELAYED_RELEASE_CAPSULE | Freq: Every day | ORAL | 3 refills | Status: DC

## 2022-02-15 NOTE — Telephone Encounter (Signed)
I thought she was going to talk to him but since she requested it, I have now sent letter.

## 2022-02-15 NOTE — Telephone Encounter (Signed)
Patient called, stated that she is having surgery on 02/27/22 and that her medical doctor, Dr. Christel Mormon stated he hasn't gotten anything requesting medical clearance for surgery.  Pt's # is [phone]

## 2022-02-15 NOTE — Telephone Encounter (Signed)
Patient called in regard to [facility]  Med cost [phone] , patient will not be picking up med. Wants a cll back with alternatives

## 2022-02-15 NOTE — Patient Instructions (Signed)
It was a pleasure to see you today.  Thank you for giving [place] the opportunity to be involved in your care.  Below is a brief recap of your visit and next steps.  We will plan to see you again in 4 weeks.  Summary Annual exam completed today I have prescribed Hycodan for cough relief Medications refilled We will start Mounjaro for diabetes Follow up in 4 weeks

## 2022-02-15 NOTE — Progress Notes (Signed)
Complete physical exam  Patient: Karen Horn   DOB: [birth year]   76 y.o. Female  MRN: [id number]  Subjective:    Chief Complaint  Patient presents with   Annual Exam    CPE today, states she will be having knee surgery on 02/27/2021.   Sinus Problem    Pt reports no relief from the sinus infection and still having a cough, was seen on 02/06/2022 with Peter Congo.    Karen Horn is a 76 y.o. female who presents today for a complete physical exam. She reports consuming a general diet. The patient does not participate in regular exercise at present. She generally feels fairly well. She reports sleeping fairly well. She does have additional problems to discuss today as she remains congested despite taking antibiotics as prescribed. She is also scheduled to undergo left knee surgery next month.   Most recent fall risk assessment:    02/15/2022    2:45 PM  Fall Risk   Falls in the past year? 0  Number falls in past yr: 0  Injury with Fall? 0  Risk for fall due to : No Fall Risks  Follow up Falls evaluation completed     Most recent depression screenings:    02/15/2022    2:45 PM 02/06/2022    1:42 PM  PHQ 2/9 Scores  PHQ - 2 Score 1 0  PHQ- 9 Score 1 0   Vision:Within last year and Dental: No current dental problems and Receives regular dental care  Past Medical History:  Diagnosis Date   Diabetes mellitus without complication ([place])    Thyroid disease    Past Surgical History:  Procedure Laterality Date   ABDOMINAL HYSTERECTOMY     ADENOIDECTOMY     BACK SURGERY     BIOPSY  09/06/2021   Procedure: BIOPSY;  Surgeon: Montez Morita, Quillian Quince, MD;  Location: [facility];  Service: Gastroenterology;;   ESOPHAGOGASTRODUODENOSCOPY (EGD) WITH PROPOFOL N/A 09/06/2021   Procedure: ESOPHAGOGASTRODUODENOSCOPY (EGD) WITH PROPOFOL;  Surgeon: Harvel Quale, MD;  Location: [facility];  Service: Gastroenterology;  Laterality: N/A;  215 ASA 2   SINOSCOPY      TONSILLECTOMY     Social History   Tobacco Use   Smoking status: Never   Smokeless tobacco: Never  Vaping Use   Vaping Use: Never used  Substance Use Topics   Alcohol use: Not Currently   Drug use: Never   History reviewed. No pertinent family history. Allergies  Allergen Reactions   Codeine Rash    Other reaction(s): Chest Pain   Gabapentin Other (See Comments)    Other reaction(s): Other Depression, suicidal thoughts   Sulfa Antibiotics Rash and Other (See Comments)    chest Pain   Atorvastatin Calcium Other (See Comments)    Other reaction(s): Other myalgia   Escitalopram Other (See Comments)    Other reaction(s): Other Headaches   Ezetimibe Other (See Comments)    Other reaction(s): Other Myalgia-patient wants to retry generic 10/17   Fluvastatin Other (See Comments)    Other reaction(s): Other myalgia   Fosamax [Alendronate]     Other reaction(s): Other Leg pain   Levofloxacin Other (See Comments)    Other reaction(s): Other Muscle aching in her shoulders, inner thighs and legs   Pramipexole Nausea Only and Other (See Comments)    Headache    Prednisone Other (See Comments)    Unknown reaction type   Rosuvastatin Calcium Other (See Comments)    Other reaction(s): Other myalgia  Sertraline Other (See Comments)    Other reaction(s): Other Headaches   Simvastatin Other (See Comments)    Other reaction(s): Other myalgia   Trazodone And Nefazodone Nausea Only   Patient Care Team: Johnette Abraham, MD as PCP - General (Internal Medicine)   Outpatient Medications Prior to Visit  Medication Sig   ACCU-CHEK AVIVA PLUS test strip 1 each by Other route 3 (three) times daily.   albuterol (VENTOLIN HFA) 108 (90 Base) MCG/ACT inhaler Inhale into the lungs.   ALPRAZolam (XANAX) 0.5 MG tablet Take 0.25 mg by mouth daily as needed for anxiety.   aspirin 81 MG chewable tablet Chew 81 mg by mouth in the morning.   atorvastatin (LIPITOR) 10 MG tablet Take 10 mg by  mouth every Monday, Wednesday, and Friday.   Blood Glucose Monitoring Suppl (ACCU-CHEK AVIVA PLUS) w/Device KIT by Does not apply route.   cholecalciferol (VITAMIN D) 25 MCG (1000 UNIT) tablet Take 2,000 Units by mouth in the morning.   EPINEPHrine 0.3 mg/0.3 mL IJ SOAJ injection epinephrine 0.3 mg/0.3 mL injection, auto-injector (Patient taking differently: Inject 0.3 mg into the muscle as needed for anaphylaxis.)   fluticasone (FLONASE) 50 MCG/ACT nasal spray Place into the nose.   glucose blood (ACCU-CHEK AVIVA PLUS) test strip TEST SUGAR 3-4 TIMES A DAY   insulin degludec (TRESIBA FLEXTOUCH) 200 UNIT/ML FlexTouch Pen Inject 10 Units into the skin 2 (two) times daily.   Insulin Pen Needle (SURE COMFORT PEN NEEDLES) 31G X 5 MM MISC Sure Comfort Pen Needle 31 gauge x 5/16"   Lactobacillus (PROBIOTIC ACIDOPHILUS) CAPS Take 1 capsule by mouth in the morning.   rOPINIRole (REQUIP) 0.25 MG tablet Take 0.25 mg by mouth at bedtime.   [DISCONTINUED] glipiZIDE (GLUCOTROL XL) 5 MG 24 hr tablet Take 1 tablet (5 mg total) by mouth daily with breakfast. Take 5 mg by mouth daily with breakfast.   [DISCONTINUED] levothyroxine (SYNTHROID) 75 MCG tablet Take 75 mcg by mouth daily before breakfast.   [DISCONTINUED] omeprazole (PRILOSEC) 20 MG capsule Take 1 capsule (20 mg total) by mouth daily.   [DISCONTINUED] promethazine-dextromethorphan (PROMETHAZINE-DM) 6.25-15 MG/5ML syrup Take 5 mLs by mouth 4 (four) times daily as needed for cough.   [DISCONTINUED] sertraline (ZOLOFT) 25 MG tablet Take 25 mg by mouth at bedtime.   [DISCONTINUED] valACYclovir (VALTREX) 500 MG tablet Take 500 mg by mouth in the morning.   No facility-administered medications prior to visit.   Review of Systems  Constitutional:  Positive for malaise/fatigue.  HENT:  Positive for congestion, ear pain, sinus pain and sore throat.       Objective:     BP 118/68 (BP Location: Left Arm)   Pulse 76   Ht 5' 0.5" (1.537 m)   Wt 113 lb  12.8 oz (51.6 kg)   SpO2 96%   BMI 21.86 kg/m  BP Readings from Last 3 Encounters:  02/15/22 118/68  02/07/22 115/78  02/06/22 115/78   Physical Exam Vitals reviewed.  Constitutional:      General: She is not in acute distress.    Appearance: Normal appearance. She is not toxic-appearing.  HENT:     Head: Normocephalic and atraumatic.     Right Ear: External ear normal.     Left Ear: External ear normal.     Nose: Congestion and rhinorrhea present.     Mouth/Throat:     Mouth: Mucous membranes are moist.     Pharynx: Oropharynx is clear. No oropharyngeal exudate or posterior oropharyngeal  erythema.  Eyes:     General: No scleral icterus.    Extraocular Movements: Extraocular movements intact.     Conjunctiva/sclera: Conjunctivae normal.     Pupils: Pupils are equal, round, and reactive to light.  Cardiovascular:     Rate and Rhythm: Normal rate and regular rhythm.     Pulses: Normal pulses.     Heart sounds: Normal heart sounds. No murmur heard.    No friction rub. No gallop.  Pulmonary:     Effort: Pulmonary effort is normal.     Breath sounds: Normal breath sounds. No wheezing, rhonchi or rales.  Abdominal:     General: Abdomen is flat. Bowel sounds are normal. There is no distension.     Palpations: Abdomen is soft.     Tenderness: There is no abdominal tenderness.  Musculoskeletal:        General: No swelling. Normal range of motion.     Cervical back: Normal range of motion.     Right lower leg: No edema.     Left lower leg: No edema.  Lymphadenopathy:     Cervical: No cervical adenopathy.  Skin:    General: Skin is warm and dry.     Capillary Refill: Capillary refill takes less than 2 seconds.     Coloration: Skin is not jaundiced.  Neurological:     General: No focal deficit present.     Mental Status: She is alert and oriented to person, place, and time.  Psychiatric:        Mood and Affect: Mood normal.        Behavior: Behavior normal.     Diabetic  foot exam was performed.  No deformities or other abnormal visual findings.  Posterior tibialis and dorsalis pulse intact bilaterally.  Intact to touch and monofilament testing bilaterally.    Last CBC Lab Results  Component Value Date   WBC 7.6 01/15/2022   HGB 14.2 01/15/2022   HCT 42.6 01/15/2022   MCV 89 01/15/2022   MCH 29.8 01/15/2022   RDW 13.4 01/15/2022   PLT 236 33/83/2919   Last metabolic panel Lab Results  Component Value Date   GLUCOSE 139 (H) 01/15/2022   NA 141 01/15/2022   K 4.4 01/15/2022   CL 105 01/15/2022   CO2 24 01/15/2022   BUN 16 01/15/2022   CREATININE 0.70 01/15/2022   EGFR 90 01/15/2022   CALCIUM 9.9 01/15/2022   PROT 6.7 01/15/2022   ALBUMIN 4.3 01/15/2022   LABGLOB 2.4 01/15/2022   AGRATIO 1.8 01/15/2022   BILITOT 0.3 01/15/2022   ALKPHOS 47 01/15/2022   AST 17 01/15/2022   ALT 18 01/15/2022   ANIONGAP 7 03/08/2021   Last lipids Lab Results  Component Value Date   CHOL 206 (H) 01/15/2022   HDL 57 01/15/2022   LDLCALC 126 (H) 01/15/2022   TRIG 131 01/15/2022   CHOLHDL 3.6 01/15/2022   Last hemoglobin A1c Lab Results  Component Value Date   HGBA1C 8.5 (H) 01/15/2022   Last thyroid functions Lab Results  Component Value Date   TSH 1.590 01/15/2022   Last vitamin D Lab Results  Component Value Date   VD25OH 31.5 01/15/2022   Last vitamin B12 and Folate Lab Results  Component Value Date   VITAMINB12 397 01/15/2022   FOLATE 10.6 01/15/2022        Assessment & Plan:    Routine Health Maintenance and Physical Exam  Immunization History  Administered Date(s) Administered   Influenza-Unspecified 11/29/2021  Health Maintenance  Topic Date Due   COVID-19 Vaccine (1) Never done   DTaP/Tdap/Td (1 - Tdap) Never done   Medicare Annual Wellness (AWV)  01/11/2022   Zoster Vaccines- Shingrix (1 of 2) 04/17/2022 (Originally 10/07/1996)   Pneumonia Vaccine 54+ Years old (1 - PCV) 01/16/2023 (Originally 10/08/2011)    COLONOSCOPY (Pts 45-9yr Insurance coverage will need to be confirmed)  01/16/2023 (Originally 10/08/1991)   HEMOGLOBIN A1C  07/16/2022   Diabetic kidney evaluation - eGFR measurement  01/16/2023   OPHTHALMOLOGY EXAM  02/09/2023   Diabetic kidney evaluation - Urine ACR  02/16/2023   FOOT EXAM  02/16/2023   INFLUENZA VACCINE  Completed   DEXA SCAN  Completed   Hepatitis C Screening  Completed   HPV VACCINES  Aged Out    Discussed health benefits of physical activity, and encouraged her to engage in regular exercise appropriate for her age and condition.  Problem List Items Addressed This Visit       Chest congestion    Recently treated with Augmentin for rhinosinusitis.  Overall her symptoms are improving, however she has a persistent nonproductive cough that keeps her awake at night.  Her symptoms did not improve despite use of over-the-counter medications. -Hycodan prescribed today for cough relief      Type 2 diabetes mellitus [place]    Last A1c was 8.5 in late November.  She is currently using Tresiba 12 units twice daily and glipizide 10 mg every morning.  Her blood sugar readings have ranged 70-330.  Blood sugars are above goal more often than not. -Start Mounjaro 2.5 mg weekly today -Diabetic foot exam completed today -Urine microalbumin/creatinine ratio ordered      Encounter for well adult exam with abnormal findings    Presenting today for annual exam. -Appropriate medications have been refilled -Diabetic foot exam completed today -Urine microalbumin/creatinine ratio ordered today -Follow-up in 4 weeks      Return in about 4 weeks (around 03/15/2022) for DM.     PJohnette Abraham MD

## 2022-02-17 LAB — MICROALBUMIN / CREATININE URINE RATIO
Creatinine, Urine: 57.7 mg/dL
Microalb/Creat Ratio: 12 mg/g creat (ref 0–29)
Microalbumin, Urine: 7.1 ug/mL

## 2022-02-21 ENCOUNTER — Telehealth: Payer: Self-pay | Admitting: Internal Medicine

## 2022-02-21 ENCOUNTER — Encounter: Payer: Self-pay | Admitting: Internal Medicine

## 2022-02-21 ENCOUNTER — Other Ambulatory Visit: Payer: Self-pay | Admitting: Orthopedic Surgery

## 2022-02-21 DIAGNOSIS — R0989 Other specified symptoms and signs involving the circulatory and respiratory systems: Secondary | ICD-10-CM | POA: Insufficient documentation

## 2022-02-21 DIAGNOSIS — Z0001 Encounter for general adult medical examination with abnormal findings: Secondary | ICD-10-CM | POA: Insufficient documentation

## 2022-02-21 DIAGNOSIS — Z794 Long term (current) use of insulin: Secondary | ICD-10-CM

## 2022-02-21 MED ORDER — OZEMPIC (0.25 OR 0.5 MG/DOSE) 2 MG/3ML ~~LOC~~ SOPN: 0.2500 mg | PEN_INJECTOR | SUBCUTANEOUS | 0 refills | Status: DC

## 2022-02-21 NOTE — Telephone Encounter (Signed)
Patient called in, states that coupon received for [facility] did not work at [employer].  Patient wants a call back in regard.

## 2022-02-21 NOTE — Assessment & Plan Note (Signed)
Last A1c was 8.5 in late November.  She is currently using Tresiba 12 units twice daily and glipizide 10 mg every morning.  Her blood sugar readings have ranged 70-330.  Blood sugars are above goal more often than not. -Start Mounjaro 2.5 mg weekly today -Diabetic foot exam completed today -Urine microalbumin/creatinine ratio ordered

## 2022-02-21 NOTE — Assessment & Plan Note (Addendum)
Presenting today for annual exam. -Appropriate medications have been refilled -Diabetic foot exam completed today -Urine microalbumin/creatinine ratio ordered today -Follow-up in 4 weeks

## 2022-02-21 NOTE — Telephone Encounter (Signed)
Patient aware.

## 2022-02-21 NOTE — Assessment & Plan Note (Signed)
Recently treated with Augmentin for rhinosinusitis.  Overall her symptoms are improving, however she has a persistent nonproductive cough that keeps her awake at night.  Her symptoms did not improve despite use of over-the-counter medications. -Hycodan prescribed today for cough relief

## 2022-02-21 NOTE — Telephone Encounter (Signed)
Will try Ozempic. Rx for 0.25 mg weekly sent to [facility].

## 2022-02-22 ENCOUNTER — Telehealth: Payer: Self-pay | Admitting: Internal Medicine

## 2022-02-22 ENCOUNTER — Other Ambulatory Visit: Payer: Self-pay | Admitting: Radiology

## 2022-02-22 DIAGNOSIS — Z01818 Encounter for other preprocedural examination: Secondary | ICD-10-CM

## 2022-02-22 DIAGNOSIS — S83242A Other tear of medial meniscus, current injury, left knee, initial encounter: Secondary | ICD-10-CM

## 2022-02-22 NOTE — Telephone Encounter (Signed)
Patient called asked does she need to continue taking her glipiZIDE (GLUCOTROL XL) 5 MG 24 hr tablet [[id number]]  and  insulin degludec (TRESIBA FLEXTOUCH) 200 UNIT/ML FlexTouch Pen [[id number]]   Please contact patient back at [phone].

## 2022-02-22 NOTE — Patient Instructions (Signed)
Jyrah Blye  02/22/2022     @PREFPERIOPPHARMACY @   Your procedure is scheduled on  02/27/2022.   Report to [facility] at  1130 A.M.   Call this number if you have problems the morning of surgery:  [phone]  If you experience any cold or flu symptoms such as cough, fever, chills, shortness of breath, etc. between now and your scheduled surgery, please notify us at the above number.   Remember:  Do not eat or drink after midnight.         Your last dose of ozempic should have been on 02/19/2022.      If you take your insulin before bed, only take 1/2 of your usual dose the night before your procedure.       DO NOT take any medications for diabetes the morning of your procedure.    Use your inhaler before you come and bring your rescu inhaler with you.    Take these medicines the morning of surgery with A SIP OF WATER       xanax(if needed), levothyroxine, omeprazole, zoloft.     Do not wear jewelry, make-up or nail polish.  Do not wear lotions, powders, or perfumes, or deodorant.  Do not shave 48 hours prior to surgery.  Men may shave face and neck.  Do not bring valuables to the hospital.  [facility] is not responsible for any belongings or valuables.  Contacts, dentures or bridgework may not be worn into surgery.  Leave your suitcase in the car.  After surgery it may be brought to your room.  For patients admitted to the hospital, discharge time will be determined by your treatment team.  Patients discharged the day of surgery will not be allowed to drive home and must have someone with them for 24 hours.    Special instructions:   DO NOT smoke tobacco or vape for 24 hours before your procedure.  Please read over the following fact sheets that you were given. Coughing and Deep Breathing, Surgical Site Infection Prevention, Anesthesia Post-op Instructions, and Care and Recovery After Surgery      Arthroscopic Knee Ligament Repair, Care  After This sheet gives you information about how to care for yourself after your procedure. Your health care provider may also give you more specific instructions. If you have problems or questions, contact your health care provider. What can I expect after the procedure? After the procedure, it is common to have: Soreness or pain in your knee. Bruising and swelling on your knee, calf, and ankle for 3-4 days. A small amount of fluid coming from the incisions. Follow these instructions at home: Medicines Take over-the-counter and prescription medicines only as told by your health care provider. Ask your health care provider if the medicine prescribed to you: Requires you to avoid driving or using machinery. Can cause constipation. You may need to take these actions to prevent or treat constipation: Drink enough fluid to keep your urine pale yellow. Take over-the-counter or prescription medicines. Eat foods that are high in fiber, such as beans, whole grains, and fresh fruits and vegetables. Limit foods that are high in fat and processed sugars, such as fried or sweet foods. If you have a brace or immobilizer: Wear it as told by your health care provider. Remove it only as told by your health care provider. Loosen it if your toes tingle, become numb, or turn cold and blue. Keep it clean and dry.  Ask your health care provider when it is safe to drive. Bathing Do not take baths, swim, or use a hot tub until your health care provider approves. Keep your bandage (dressing) dry until your health care provider says that it can be removed. If the brace or immobilizer is not waterproof: Do not let it get wet. Cover it with a watertight covering when you take a bath or shower. Incision care  Follow instructions from your health care provider about how to take care of your incisions. Make sure you: Wash your hands with soap and water for at least 20 seconds before and after you change your  dressing. If soap and water are not available, use hand sanitizer. Change your dressing as told by your health care provider. Leave stitches (sutures), skin glue, or adhesive strips in place. These skin closures may need to stay in place for 2 weeks or longer. If adhesive strip edges start to loosen and curl up, you may trim the loose edges. Do not remove adhesive strips completely unless your health care provider tells you to do that. Check your incision areas every day for signs of infection. Check for: Redness. More swelling or pain. Blood or more fluid. Warmth. Pus or a bad smell. Managing pain, stiffness, and swelling  If directed, put ice on the affected area. To do this: If you have a removable brace or immobilizer, remove it as told by your health care provider. Put ice in a plastic bag. Place a towel between your skin and the bag. Leave the ice on for 20 minutes, 2-3 times a day. Remove the ice if your skin turns bright red. This is very important. If you cannot feel pain, heat, or cold, you have a greater risk of damage to the area. Move your toes often to reduce stiffness and swelling. Raise (elevate) the injured area above the level of your heart while you are sitting or lying down. Activity Do not use your knee to support your body weight until your health care provider says that you can. Use crutches or other devices as told by your health care provider. Do physical therapy exercises as told by your health care provider. Physical therapy will help you regain movement and strength in your knee. Follow instructions from your health care provider about: When you may start motion exercises. When you may start riding a stationary bike and doing other low-impact activities. When you may start to jog and do other high-impact activities. Do not lift anything that is heavier than 10 lb (4.5 kg), or the limit that you are told, until your health care provider says that it is safe. Ask  your health care provider what activities are safe for you. General instructions Do not use any products that contain nicotine or tobacco, such as cigarettes, e-cigarettes, and chewing tobacco. These can delay healing. If you need help quitting, ask your health care provider. Wear compression stockings as told by your health care provider. These stockings help to prevent blood clots and reduce swelling in your legs. Keep all follow-up visits. This is important. Contact a health care provider if: You have any of these signs of infection: Redness around an incision. Blood or more fluid coming from an incision. Warmth coming from an incision. Pus or a bad smell coming from an incision. More swelling or pain in your knee. A fever or chills. You have pain that does not get better with medicine. Your incision opens up. Get help right away if:  You have trouble breathing. You have chest pain. You have increased pain or swelling in your calf or at the back of your knee. You have numbness and tingling near the knee joint or in the foot, ankle, or toes. You notice that your foot or toes look darker than normal or are cooler than normal. These symptoms may represent a serious problem that is an emergency. Do not wait to see if the symptoms will go away. Get medical help right away. Call your local emergency services (911 in the [place] Do not drive yourself to the hospital. Summary After the procedure, it is common to have knee pain with bruising and swelling on your knee, calf, and ankle. Icing your knee and raising your leg above the level of your heart will help control the pain and swelling. Do physical therapy exercises as told by your health care provider. Physical therapy will help you regain movement and strength in your knee. This information is not intended to replace advice given to you by your health care provider. Make sure you discuss any questions you have with your health care  provider. Document Revised: 07/06/2019 Document Reviewed: 07/06/2019 Elsevier Patient Education  2023 [employer]. General Anesthesia, Adult, Care After The following information offers guidance on how to care for yourself after your procedure. Your health care provider may also give you more specific instructions. If you have problems or questions, contact your health care provider. What can I expect after the procedure? After the procedure, it is common for people to: Have pain or discomfort at the IV site. Have nausea or vomiting. Have a sore throat or hoarseness. Have trouble concentrating. Feel cold or chills. Feel weak, sleepy, or tired (fatigue). Have soreness and body aches. These can affect parts of the body that were not involved in surgery. Follow these instructions at home: For the time period you were told by your health care provider:  Rest. Do not participate in activities where you could fall or become injured. Do not drive or use machinery. Do not drink alcohol. Do not take sleeping pills or medicines that cause drowsiness. Do not make important decisions or sign legal documents. Do not take care of children on your own. General instructions Drink enough fluid to keep your urine pale yellow. If you have sleep apnea, surgery and certain medicines can increase your risk for breathing problems. Follow instructions from your health care provider about wearing your sleep device: Anytime you are sleeping, including during daytime naps. While taking prescription pain medicines, sleeping medicines, or medicines that make you drowsy. Return to your normal activities as told by your health care provider. Ask your health care provider what activities are safe for you. Take over-the-counter and prescription medicines only as told by your health care provider. Do not use any products that contain nicotine or tobacco. These products include cigarettes, chewing tobacco, and vaping  devices, such as e-cigarettes. These can delay incision healing after surgery. If you need help quitting, ask your health care provider. Contact a health care provider if: You have nausea or vomiting that does not get better with medicine. You vomit every time you eat or drink. You have pain that does not get better with medicine. You cannot urinate or have bloody urine. You develop a skin rash. You have a fever. Get help right away if: You have trouble breathing. You have chest pain. You vomit blood. These symptoms may be an emergency. Get help right away. Call 911. Do not wait  to see if the symptoms will go away. Do not drive yourself to the hospital. Summary After the procedure, it is common to have a sore throat, hoarseness, nausea, vomiting, or to feel weak, sleepy, or fatigue. For the time period you were told by your health care provider, do not drive or use machinery. Get help right away if you have difficulty breathing, have chest pain, or vomit blood. These symptoms may be an emergency. This information is not intended to replace advice given to you by your health care provider. Make sure you discuss any questions you have with your health care provider. Document Revised: 05/05/2021 Document Reviewed: 05/05/2021 Elsevier Patient Education  2023 Elsevier Inc. How to Use Chlorhexidine Before Surgery Chlorhexidine gluconate (CHG) is a germ-killing (antiseptic) solution that is used to clean the skin. It can get rid of the bacteria that normally live on the skin and can keep them away for about 24 hours. To clean your skin with CHG, you may be given: A CHG solution to use in the shower or as part of a sponge bath. A prepackaged cloth that contains CHG. Cleaning your skin with CHG may help lower the risk for infection: While you are staying in the intensive care unit of the hospital. If you have a vascular access, such as a central line, to provide short-term or long-term access to  your veins. If you have a catheter to drain urine from your bladder. If you are on a ventilator. A ventilator is a machine that helps you breathe by moving air in and out of your lungs. After surgery. What are the risks? Risks of using CHG include: A skin reaction. Hearing loss, if CHG gets in your ears and you have a perforated eardrum. Eye injury, if CHG gets in your eyes and is not rinsed out. The CHG product catching fire. Make sure that you avoid smoking and flames after applying CHG to your skin. Do not use CHG: If you have a chlorhexidine allergy or have previously reacted to chlorhexidine. On babies younger than 24 months of age. How to use CHG solution Use CHG only as told by your health care provider, and follow the instructions on the label. Use the full amount of CHG as directed. Usually, this is one bottle. During a shower Follow these steps when using CHG solution during a shower (unless your health care provider gives you different instructions): Start the shower. Use your normal soap and shampoo to wash your face and hair. Turn off the shower or move out of the shower stream. Pour the CHG onto a clean washcloth. Do not use any type of brush or rough-edged sponge. Starting at your neck, lather your body down to your toes. Make sure you follow these instructions: If you will be having surgery, pay special attention to the part of your body where you will be having surgery. Scrub this area for at least 1 minute. Do not use CHG on your head or face. If the solution gets into your ears or eyes, rinse them well with water. Avoid your genital area. Avoid any areas of skin that have broken skin, cuts, or scrapes. Scrub your back and under your arms. Make sure to wash skin folds. Let the lather sit on your skin for 1-2 minutes or as long as told by your health care provider. Thoroughly rinse your entire body in the shower. Make sure that all body creases and crevices are rinsed  well. Dry off with a clean towel.  Do not put any substances on your body afterward--such as powder, lotion, or perfume--unless you are told to do so by your health care provider. Only use lotions that are recommended by the manufacturer. Put on clean clothes or pajamas. If it is the night before your surgery, sleep in clean sheets.  During a sponge bath Follow these steps when using CHG solution during a sponge bath (unless your health care provider gives you different instructions): Use your normal soap and shampoo to wash your face and hair. Pour the CHG onto a clean washcloth. Starting at your neck, lather your body down to your toes. Make sure you follow these instructions: If you will be having surgery, pay special attention to the part of your body where you will be having surgery. Scrub this area for at least 1 minute. Do not use CHG on your head or face. If the solution gets into your ears or eyes, rinse them well with water. Avoid your genital area. Avoid any areas of skin that have broken skin, cuts, or scrapes. Scrub your back and under your arms. Make sure to wash skin folds. Let the lather sit on your skin for 1-2 minutes or as long as told by your health care provider. Using a different clean, wet washcloth, thoroughly rinse your entire body. Make sure that all body creases and crevices are rinsed well. Dry off with a clean towel. Do not put any substances on your body afterward--such as powder, lotion, or perfume--unless you are told to do so by your health care provider. Only use lotions that are recommended by the manufacturer. Put on clean clothes or pajamas. If it is the night before your surgery, sleep in clean sheets. How to use CHG prepackaged cloths Only use CHG cloths as told by your health care provider, and follow the instructions on the label. Use the CHG cloth on clean, dry skin. Do not use the CHG cloth on your head or face unless your health care provider tells  you to. When washing with the CHG cloth: Avoid your genital area. Avoid any areas of skin that have broken skin, cuts, or scrapes. Before surgery Follow these steps when using a CHG cloth to clean before surgery (unless your health care provider gives you different instructions): Using the CHG cloth, vigorously scrub the part of your body where you will be having surgery. Scrub using a back-and-forth motion for 3 minutes. The area on your body should be completely wet with CHG when you are done scrubbing. Do not rinse. Discard the cloth and let the area air-dry. Do not put any substances on the area afterward, such as powder, lotion, or perfume. Put on clean clothes or pajamas. If it is the night before your surgery, sleep in clean sheets.  For general bathing Follow these steps when using CHG cloths for general bathing (unless your health care provider gives you different instructions). Use a separate CHG cloth for each area of your body. Make sure you wash between any folds of skin and between your fingers and toes. Wash your body in the following order, switching to a new cloth after each step: The front of your neck, shoulders, and chest. Both of your arms, under your arms, and your hands. Your stomach and groin area, avoiding the genitals. Your right leg and foot. Your left leg and foot. The back of your neck, your back, and your buttocks. Do not rinse. Discard the cloth and let the area air-dry. Do not put  any substances on your body afterward--such as powder, lotion, or perfume--unless you are told to do so by your health care provider. Only use lotions that are recommended by the manufacturer. Put on clean clothes or pajamas. Contact a health care provider if: Your skin gets irritated after scrubbing. You have questions about using your solution or cloth. You swallow any chlorhexidine. Call your local poison control center [phone] in the [place] Get help right away if: Your  eyes itch badly, or they become very red or swollen. Your skin itches badly and is red or swollen. Your hearing changes. You have trouble seeing. You have swelling or tingling in your mouth or throat. You have trouble breathing. These symptoms may represent a serious problem that is an emergency. Do not wait to see if the symptoms will go away. Get medical help right away. Call your local emergency services (911 in the [place] Do not drive yourself to the hospital. Summary Chlorhexidine gluconate (CHG) is a germ-killing (antiseptic) solution that is used to clean the skin. Cleaning your skin with CHG may help to lower your risk for infection. You may be given CHG to use for bathing. It may be in a bottle or in a prepackaged cloth to use on your skin. Carefully follow your health care provider's instructions and the instructions on the product label. Do not use CHG if you have a chlorhexidine allergy. Contact your health care provider if your skin gets irritated after scrubbing. This information is not intended to replace advice given to you by your health care provider. Make sure you discuss any questions you have with your health care provider. Document Revised: 06/05/2021 Document Reviewed: 04/18/2020 Elsevier Patient Education  2023 [employer]

## 2022-02-22 NOTE — Telephone Encounter (Signed)
Patient aware.

## 2022-02-23 ENCOUNTER — Encounter (HOSPITAL_COMMUNITY): Payer: Self-pay

## 2022-02-23 ENCOUNTER — Encounter (HOSPITAL_COMMUNITY)
Admission: RE | Admit: 2022-02-23 | Discharge: 2022-02-23 | Disposition: A | Discharge status: Home / Self Care | Payer: Medicare Other | Source: Ambulatory Visit | Attending: Orthopedic Surgery | Admitting: Orthopedic Surgery

## 2022-02-23 DIAGNOSIS — S83207A Unspecified tear of unspecified meniscus, current injury, left knee, initial encounter: Secondary | ICD-10-CM | POA: Insufficient documentation

## 2022-02-23 DIAGNOSIS — Z01818 Encounter for other preprocedural examination: Secondary | ICD-10-CM | POA: Diagnosis not present

## 2022-02-23 HISTORY — DX: Hypothyroidism, unspecified: E03.9

## 2022-02-23 LAB — CBC WITH DIFFERENTIAL/PLATELET
Abs Immature Granulocytes: 0.02 10*3/uL (ref 0.00–0.07)
Basophils Absolute: 0.1 10*3/uL (ref 0.0–0.1)
Basophils Relative: 1 %
Eosinophils Absolute: 0.1 10*3/uL (ref 0.0–0.5)
Eosinophils Relative: 2 %
HCT: 42.4 % (ref 36.0–46.0)
Hemoglobin: 13.6 g/dL (ref 12.0–15.0)
Immature Granulocytes: 0 %
Lymphocytes Relative: 32 %
Lymphs Abs: 2 10*3/uL (ref 0.7–4.0)
MCH: 29.1 pg (ref 26.0–34.0)
MCHC: 32.1 g/dL (ref 30.0–36.0)
MCV: 90.6 fL (ref 80.0–100.0)
Monocytes Absolute: 0.4 10*3/uL (ref 0.1–1.0)
Monocytes Relative: 6 %
Neutro Abs: 3.8 10*3/uL (ref 1.7–7.7)
Neutrophils Relative %: 59 %
Platelets: 228 10*3/uL (ref 150–400)
RBC: 4.68 MIL/uL (ref 3.87–5.11)
RDW: 13.6 % (ref 11.5–15.5)
WBC: 6.4 10*3/uL (ref 4.0–10.5)
nRBC: 0 % (ref 0.0–0.2)

## 2022-02-23 LAB — BASIC METABOLIC PANEL
Anion gap: 9 (ref 5–15)
BUN: 14 mg/dL (ref 8–23)
CO2: 21 mmol/L — ABNORMAL LOW (ref 22–32)
Calcium: 9.3 mg/dL (ref 8.9–10.3)
Chloride: 104 mmol/L (ref 98–111)
Creatinine, Ser: 0.7 mg/dL (ref 0.44–1.00)
GFR, Estimated: >60 mL/min (ref >60)
Glucose, Bld: 218 mg/dL — ABNORMAL HIGH (ref 70–99)
Potassium: 4 mmol/L (ref 3.5–5.1)
Sodium: 134 mmol/L — ABNORMAL LOW (ref 135–145)

## 2022-02-26 NOTE — H&P (Signed)
Chief Complaint  Patient presents with   Knee Pain      Left surgical consult Dr Hilda Lias     77 year old female sent to me by Dr. Hilda Lias for evaluation of her left knee   Back in September she started having pain in the left knee associated with popping and swelling and giving way which has not resolved       She was treated with a cortisone injection, topical creams were recommended as well.  And she also had Tylenol, rest, bracing and ice   She says she cannot take the knee the way it is right now and does not want to deal with the medial pain for the rest of her life   She does have some medial soft tissue pain posteriorly and posteromedially involving the abductors and hamstrings   Review of systems nothing acutely at this time specifically no chest pain or shortness of breath easy bruising or bleeding       Past Medical History:  Diagnosis Date   Diabetes mellitus without complication (HCC)     Thyroid disease           Past Surgical History:  Procedure Laterality Date   ABDOMINAL HYSTERECTOMY       ADENOIDECTOMY       BACK SURGERY       BIOPSY   09/06/2021    Procedure: BIOPSY;  Surgeon: Dolores Frame, MD;  Location: [facility];  Service: Gastroenterology;;   ESOPHAGOGASTRODUODENOSCOPY (EGD) WITH PROPOFOL N/A 09/06/2021    Procedure: ESOPHAGOGASTRODUODENOSCOPY (EGD) WITH PROPOFOL;  Surgeon: Dolores Frame, MD;  Location: [facility];  Service: Gastroenterology;  Laterality: N/A;  215 [address]SA 2   SINOSCOPY       TONSILLECTOMY        No family history on file. Social History        Tobacco Use   Smoking status: Never   Smokeless tobacco: Never  Vaping Use   Vaping Use: Never used  Substance Use Topics   Alcohol use: Not Currently   Drug use: Never         Allergies  Allergen Reactions   Codeine Rash      Other reaction(s): Chest Pain   Gabapentin Other (See Comments)      Other reaction(s): Other Depression, suicidal thoughts    Sulfa Antibiotics Rash and Other (See Comments)      chest Pain   Atorvastatin Calcium Other (See Comments)      Other reaction(s): Other myalgia   Escitalopram Other (See Comments)      Other reaction(s): Other Headaches   Ezetimibe Other (See Comments)      Other reaction(s): Other Myalgia-patient wants to retry generic 10/17   Fluvastatin Other (See Comments)      Other reaction(s): Other myalgia   Fosamax [Alendronate]        Other reaction(s): Other Leg pain   Levofloxacin Other (See Comments)      Other reaction(s): Other Muscle aching in her shoulders, inner thighs and legs   Pramipexole Nausea Only and Other (See Comments)      Headache    Prednisone Other (See Comments)      Unknown reaction type   Rosuvastatin Calcium Other (See Comments)      Other reaction(s): Other myalgia   Sertraline Other (See Comments)      Other reaction(s): Other Headaches   Simvastatin Other (See Comments)      Other reaction(s): Other myalgia   Trazodone  And Nefazodone Nausea Only    BP 115/78 Comment: yesterday at PCP  Ht 5' 0.5" (1.537 m)   Wt 113 lb (51.3 kg)   BMI 21.71 kg/m  Physical Exam Vitals and nursing note reviewed.  Constitutional:      Appearance: Normal appearance.  HENT:     Head: Normocephalic and atraumatic.  Eyes:     General: No scleral icterus.       Right eye: No discharge.        Left eye: No discharge.     Extraocular Movements: Extraocular movements intact.     Conjunctiva/sclera: Conjunctivae normal.     Pupils: Pupils are equal, round, and reactive to light.  Cardiovascular:     Rate and Rhythm: Normal rate.     Pulses: Normal pulses.  Skin:    General: Skin is warm and dry.     Capillary Refill: Capillary refill takes less than 2 seconds.  Neurological:     General: No focal deficit present.     Mental Status: She is alert and oriented to person, place, and time.  Psychiatric:        Mood and Affect: Mood normal.        Behavior: Behavior  normal.        Thought Content: Thought content normal.        Judgment: Judgment normal.      Left knee evaluation   The patient exhibits full range of motion of the left knee she is tender in the posterior medial corner at the joint line she is also tender in the soft tissues around the medial side of the knee including the hamstrings.  The collateral ligaments feel stable the cruciate ligaments feel stable.  I do not detect tact an effusion.  She had some symptoms with McMurray's although it was a soft positive finding   I read the MRI as follows it does show a medial meniscal tear it shows some edema in the medial collateral ligament which may explain her medial soft tissue sleeve pain   We talked about addressing this with therapy after surgery   We will proceed with    arthroscopy of the left knee partial medial meniscectomy     The procedure has been fully reviewed with the patient; The risks and benefits of surgery have been discussed and explained and understood. Alternative treatment has also been reviewed, questions were encouraged and answered. The postoperative plan is also been reviewed.

## 2022-02-27 ENCOUNTER — Encounter (HOSPITAL_COMMUNITY)
Admission: RE | Disposition: A | Discharge status: Home / Self Care | Payer: Self-pay | Source: Home / Self Care | Attending: Orthopedic Surgery

## 2022-02-27 ENCOUNTER — Telehealth: Payer: Self-pay | Admitting: Orthopedic Surgery

## 2022-02-27 ENCOUNTER — Other Ambulatory Visit: Payer: Self-pay | Admitting: Internal Medicine

## 2022-02-27 ENCOUNTER — Other Ambulatory Visit: Payer: Self-pay

## 2022-02-27 ENCOUNTER — Ambulatory Visit (HOSPITAL_COMMUNITY)
Admission: RE | Admit: 2022-02-27 | Discharge: 2022-02-27 | Disposition: A | Discharge status: Home / Self Care | Payer: Medicare Other | Attending: Orthopedic Surgery | Admitting: Orthopedic Surgery

## 2022-02-27 ENCOUNTER — Ambulatory Visit (HOSPITAL_BASED_OUTPATIENT_CLINIC_OR_DEPARTMENT_OTHER): Payer: Medicare Other | Admitting: Anesthesiology

## 2022-02-27 ENCOUNTER — Encounter (HOSPITAL_COMMUNITY): Payer: Self-pay | Admitting: Orthopedic Surgery

## 2022-02-27 ENCOUNTER — Ambulatory Visit (HOSPITAL_COMMUNITY): Payer: Medicare Other | Admitting: Anesthesiology

## 2022-02-27 DIAGNOSIS — E039 Hypothyroidism, unspecified: Secondary | ICD-10-CM | POA: Diagnosis not present

## 2022-02-27 DIAGNOSIS — K219 Gastro-esophageal reflux disease without esophagitis: Secondary | ICD-10-CM | POA: Diagnosis not present

## 2022-02-27 DIAGNOSIS — X58XXXA Exposure to other specified factors, initial encounter: Secondary | ICD-10-CM | POA: Diagnosis not present

## 2022-02-27 DIAGNOSIS — E119 Type 2 diabetes mellitus without complications: Secondary | ICD-10-CM | POA: Insufficient documentation

## 2022-02-27 DIAGNOSIS — S83242A Other tear of medial meniscus, current injury, left knee, initial encounter: Secondary | ICD-10-CM

## 2022-02-27 DIAGNOSIS — Z01818 Encounter for other preprocedural examination: Secondary | ICD-10-CM

## 2022-02-27 HISTORY — PX: KNEE ARTHROSCOPY WITH MEDIAL MENISECTOMY: SHX5651

## 2022-02-27 LAB — GLUCOSE, CAPILLARY
Glucose-Capillary: 132 mg/dL — ABNORMAL HIGH (ref 70–99)
Glucose-Capillary: 129 mg/dL — ABNORMAL HIGH (ref 70–99)
Glucose-Capillary: 132 mg/dL — ABNORMAL HIGH (ref 70–99)

## 2022-02-27 SURGERY — ARTHROSCOPY, KNEE, WITH MEDIAL MENISCECTOMY
Anesthesia: General | Site: Knee | Laterality: Left

## 2022-02-27 MED ORDER — EPHEDRINE SULFATE (PRESSORS) 50 MG/ML IJ SOLN
INTRAMUSCULAR | Status: DC | PRN
  Administered 2022-02-27: 5 mg via INTRAVENOUS

## 2022-02-27 MED ORDER — DEXAMETHASONE SODIUM PHOSPHATE 10 MG/ML IJ SOLN
INTRAMUSCULAR | Status: DC | PRN
  Administered 2022-02-27: 4 mg via INTRAVENOUS

## 2022-02-27 MED ORDER — ONDANSETRON HCL 4 MG/2ML IJ SOLN: 4.0000 mg | Freq: Once | INTRAMUSCULAR | Status: DC | PRN

## 2022-02-27 MED ORDER — IBUPROFEN 800 MG PO TABS: 800.0000 mg | ORAL_TABLET | Freq: Three times a day (TID) | ORAL | 1 refills | Status: DC | PRN

## 2022-02-27 MED ORDER — EPINEPHRINE PF 1 MG/ML IJ SOLN
INTRAMUSCULAR | Status: AC
  Filled 2022-02-27: qty 6

## 2022-02-27 MED ORDER — LIDOCAINE HCL (CARDIAC) PF 100 MG/5ML IV SOSY
PREFILLED_SYRINGE | INTRAVENOUS | Status: DC | PRN
  Administered 2022-02-27: 60 mg via INTRAVENOUS

## 2022-02-27 MED ORDER — BUPIVACAINE-EPINEPHRINE (PF) 0.5% -1:200000 IJ SOLN
INTRAMUSCULAR | Status: AC
  Filled 2022-02-27: qty 30

## 2022-02-27 MED ORDER — HYDROCODONE-ACETAMINOPHEN 5-325 MG PO TABS: 1.0000 | ORAL_TABLET | ORAL | 0 refills | Status: DC | PRN

## 2022-02-27 MED ORDER — SODIUM CHLORIDE 0.9 % IR SOLN
Status: DC | PRN
  Administered 2022-02-27 (×2): 3000 mL

## 2022-02-27 MED ORDER — PHENYLEPHRINE 80 MCG/ML (10ML) SYRINGE FOR IV PUSH (FOR BLOOD PRESSURE SUPPORT)
PREFILLED_SYRINGE | INTRAVENOUS | Status: DC | PRN
  Administered 2022-02-27: 80 ug via INTRAVENOUS

## 2022-02-27 MED ORDER — FENTANYL CITRATE (PF) 250 MCG/5ML IJ SOLN
INTRAMUSCULAR | Status: DC | PRN
  Administered 2022-02-27 (×2): 50 ug via INTRAVENOUS

## 2022-02-27 MED ORDER — CEFAZOLIN SODIUM-DEXTROSE 2-4 GM/100ML-% IV SOLN: 2.0000 g | INTRAVENOUS | Status: DC

## 2022-02-27 MED ORDER — ACETAMINOPHEN 500 MG PO TABS: 500.0000 mg | ORAL_TABLET | Freq: Once | ORAL | Status: DC

## 2022-02-27 MED ORDER — FENTANYL CITRATE PF 50 MCG/ML IJ SOSY: 25.0000 ug | PREFILLED_SYRINGE | INTRAMUSCULAR | Status: DC | PRN

## 2022-02-27 MED ORDER — ONDANSETRON HCL 4 MG/2ML IJ SOLN
INTRAMUSCULAR | Status: DC | PRN
  Administered 2022-02-27: 4 mg via INTRAVENOUS

## 2022-02-27 MED ORDER — PROPOFOL 10 MG/ML IV BOLUS
INTRAVENOUS | Status: AC
  Filled 2022-02-27: qty 20

## 2022-02-27 MED ORDER — CEFAZOLIN SODIUM-DEXTROSE 2-4 GM/100ML-% IV SOLN
INTRAVENOUS | Status: AC
  Filled 2022-02-27: qty 100

## 2022-02-27 MED ORDER — OXYCODONE HCL 5 MG PO TABS: 5.0000 mg | ORAL_TABLET | Freq: Once | ORAL | Status: DC | PRN

## 2022-02-27 MED ORDER — CEFAZOLIN SODIUM-DEXTROSE 2-4 GM/100ML-% IV SOLN
2.0000 g | INTRAVENOUS | Status: AC
  Administered 2022-02-27: 2 g via INTRAVENOUS

## 2022-02-27 MED ORDER — BUPIVACAINE-EPINEPHRINE (PF) 0.5% -1:200000 IJ SOLN
INTRAMUSCULAR | Status: DC | PRN
  Administered 2022-02-27: 30 mL via PERINEURAL

## 2022-02-27 MED ORDER — OXYCODONE HCL 5 MG/5ML PO SOLN: 5.0000 mg | Freq: Once | ORAL | Status: DC | PRN

## 2022-02-27 MED ORDER — LACTATED RINGERS IV SOLN: INTRAVENOUS | Status: DC | PRN

## 2022-02-27 MED ORDER — FENTANYL CITRATE (PF) 100 MCG/2ML IJ SOLN
INTRAMUSCULAR | Status: AC
  Filled 2022-02-27: qty 2

## 2022-02-27 MED ORDER — PROPOFOL 10 MG/ML IV BOLUS
INTRAVENOUS | Status: DC | PRN
  Administered 2022-02-27: 150 mg via INTRAVENOUS

## 2022-02-27 MED ORDER — LIDOCAINE HCL (PF) 2 % IJ SOLN
INTRAMUSCULAR | Status: AC
  Filled 2022-02-27: qty 5

## 2022-02-27 SURGICAL SUPPLY — 44 items
ABLATOR ASPIRATE 50D MULTI-PRT (SURGICAL WAND) IMPLANT
APL PRP STRL LF DISP 70% ISPRP (MISCELLANEOUS) ×1
BAG HAMPER (MISCELLANEOUS) ×1 IMPLANT
BLADE SURG SZ11 CARB STEEL (BLADE) ×1 IMPLANT
BNDG CMPR STD VLCR NS LF 5.8X6 (GAUZE/BANDAGES/DRESSINGS) ×1
BNDG ELASTIC 6X5.8 VLCR NS LF (GAUZE/BANDAGES/DRESSINGS) ×1 IMPLANT
CHLORAPREP W/TINT 26 (MISCELLANEOUS) ×1 IMPLANT
CLOTH BEACON ORANGE TIMEOUT ST (SAFETY) ×1 IMPLANT
COOLER ICEMAN CLASSIC (MISCELLANEOUS) ×1 IMPLANT
DECANTER SPIKE VIAL GLASS SM (MISCELLANEOUS) ×2 IMPLANT
DRAPE HALF SHEET 40X57 (DRAPES) ×1 IMPLANT
GAUZE 4X4 16PLY ~~LOC~~+RFID DBL (SPONGE) ×1 IMPLANT
GAUZE SPONGE 4X4 12PLY STRL (GAUZE/BANDAGES/DRESSINGS) ×1 IMPLANT
GAUZE XEROFORM 5X9 LF (GAUZE/BANDAGES/DRESSINGS) ×1 IMPLANT
GLOVE BIOGEL PI IND STRL 7.0 (GLOVE) ×2 IMPLANT
GLOVE SS N UNI LF 8.5 STRL (GLOVE) ×1 IMPLANT
GLOVE SURG POLYISO LF SZ8 (GLOVE) ×1 IMPLANT
GLOVE SURG SS PI 7.0 STRL IVOR (GLOVE) IMPLANT
GOWN STRL REUS W/TWL LRG LVL3 (GOWN DISPOSABLE) ×1 IMPLANT
GOWN STRL REUS W/TWL XL LVL3 (GOWN DISPOSABLE) ×1 IMPLANT
IV NS IRRIG 3000ML ARTHROMATIC (IV SOLUTION) ×2 IMPLANT
KIT BLADEGUARD II DBL (SET/KITS/TRAYS/PACK) ×1 IMPLANT
KIT TURNOVER CYSTO (KITS) ×1 IMPLANT
MANIFOLD NEPTUNE II (INSTRUMENTS) ×1 IMPLANT
MARKER SKIN DUAL TIP RULER LAB (MISCELLANEOUS) ×1 IMPLANT
NDL HYPO 18GX1.5 BLUNT FILL (NEEDLE) ×1 IMPLANT
NDL HYPO 21X1.5 SAFETY (NEEDLE) ×1 IMPLANT
NEEDLE HYPO 18GX1.5 BLUNT FILL (NEEDLE) ×1 IMPLANT
NEEDLE HYPO 21X1.5 SAFETY (NEEDLE) ×1 IMPLANT
PACK ARTHRO LIMB DRAPE STRL (MISCELLANEOUS) ×1 IMPLANT
PACK ARTHROSCOPY WL (CUSTOM PROCEDURE TRAY) ×1 IMPLANT
PAD ABD 5X9 TENDERSORB (GAUZE/BANDAGES/DRESSINGS) ×1 IMPLANT
PAD ARMBOARD 7.5X6 YLW CONV (MISCELLANEOUS) ×1 IMPLANT
PAD COLD SHLDR SM WRAP-ON (PAD) IMPLANT
PAD FOR LEG HOLDER (MISCELLANEOUS) ×1 IMPLANT
PADDING CAST COTTON 6X4 STRL (CAST SUPPLIES) ×1 IMPLANT
RESECTOR TORPEDO 4MM 13CM CVD (MISCELLANEOUS) IMPLANT
SET ARTHROSCOPY INST (INSTRUMENTS) ×1 IMPLANT
SET BASIN LINEN APH (SET/KITS/TRAYS/PACK) ×1 IMPLANT
SUT ETHILON 3 0 FSL (SUTURE) ×1 IMPLANT
SYR 10ML LL (SYRINGE) ×1 IMPLANT
SYR 30ML LL (SYRINGE) ×2 IMPLANT
TUBE CONNECTING 12X1/4 (SUCTIONS) ×2 IMPLANT
TUBING IN/OUT FLOW W/MAIN PUMP (TUBING) ×1 IMPLANT

## 2022-02-27 NOTE — Telephone Encounter (Signed)
Yes, she can disconnect tubing when she is walking but should leave it on otherwise, its in her discharge summary, I called her and we discussed she voiced understanding

## 2022-02-27 NOTE — Op Note (Signed)
02/27/2022  1:29 PM  PATIENT:  Karen Horn  77 y.o. female  PRE-OPERATIVE DIAGNOSIS:  left knee torn medial meniscus  POST-OPERATIVE DIAGNOSIS:  left knee torn medial meniscus  PROCEDURE:  Procedure(s): KNEE ARTHROSCOPY WITH MEDIAL MENISCECTOMY (Left)- 29881  FINDINGS AT SURGERY   MEDIAL: Medial meniscus tear posterior horn with diffuse thinning of the articular cartilage  PTF: Chondromalacia grade 1 and 2  LATERAL normal articular surfaces and normal meniscus  NOTCH : Normal ACL and PCL  The patient was seen in the preop area examined checked and cleared for surgery.  Left knee confirmed the surgical site marked with surgeon's initials images reviewed  Patient taken the operating general anesthesia.  She was in supine position with a leg holder on the left leg padded leg holder on the right leg  The left leg was prepped and draped sterilely Timeout was completed  Standard lateral portal was established the scope was placed into the joint a diagnostic arthroscopy was performed starting in the suprapatellar pouch advancing across the patellofemoral joint notch medial and lateral compartments medial lateral gutters  Pathology was found in the medial compartment so a spinal needle was used to make a medial portal and a curved up-biting meniscal forcep was used to morcellized the torn edges of the meniscus.  There was a horizontal component to the tear and the inferior surface was morselized as well.  A curved shaver torpedo was placed in the joint to remove the meniscal fragments and contour the remaining meniscus.  This was assisted with a 50 degree arthroscopic wand  Post shaving probing confirmed stable meniscal rim  The knee was irrigated dried and injected with Marcaine with epinephrine and portals were closed with two 3-0 nylon sutures  A sterile dressing was applied.  The Cryo/Cuff was placed and activated.  The patient was extubated and taken to recovery room in stable  condition  Postop plan weight-bear as tolerated  Follow-up in 1 week  SURGEON:  Surgeon(s) and Role:    * Carole Civil, MD - Primary  PHYSICIAN ASSISTANT:   ASSISTANTS: none   ANESTHESIA:   general  EBL:  none   BLOOD ADMINISTERED:none  DRAINS: none   LOCAL MEDICATIONS USED:  MARCAINE     SPECIMEN:  No Specimen  DISPOSITION OF SPECIMEN:  N/A  COUNTS:  YES  TOURNIQUET:  * No tourniquets in log *  DICTATION: .Dragon Dictation  PLAN OF CARE: Discharge to home after PACU  PATIENT DISPOSITION:  PACU - hemodynamically stable.   Delay start of Pharmacological VTE agent (>24hrs) due to surgical blood loss or risk of bleeding: not applicable

## 2022-02-27 NOTE — Anesthesia Procedure Notes (Signed)
Procedure Name: LMA Insertion Date/Time: 02/27/2022 12:50 PM  Performed by: Lieutenant Diego, CRNAPre-anesthesia Checklist: Patient identified, Emergency Drugs available, Suction available and Patient being monitored Patient Re-evaluated:Patient Re-evaluated prior to induction Oxygen Delivery Method: Circle system utilized Preoxygenation: Pre-oxygenation with 100% oxygen Induction Type: IV induction Ventilation: Mask ventilation without difficulty LMA: LMA inserted LMA Size: 3.0 Number of attempts: 1 Placement Confirmation: positive ETCO2 and breath sounds checked- equal and bilateral Tube secured with: Tape Dental Injury: Teeth and Oropharynx as per pre-operative assessment

## 2022-02-27 NOTE — Brief Op Note (Addendum)
02/27/2022  1:29 PM  PATIENT:  Karen Horn  77 y.o. female  PRE-OPERATIVE DIAGNOSIS:  left knee torn medial meniscus  POST-OPERATIVE DIAGNOSIS:  left knee torn medial meniscus  PROCEDURE:  Procedure(s): KNEE ARTHROSCOPY WITH MEDIAL MENISCECTOMY (Left)  FINDINGS AT SURGERY   MEDIAL: Medial meniscus tear posterior horn with diffuse thinning of the articular cartilage  PTF: Chondromalacia grade 1 and 2  LATERAL normal articular surfaces and normal meniscus  NOTCH : Normal ACL and PCL  The patient was seen in the preop area examined checked and cleared for surgery.  Left knee confirmed the surgical site marked with surgeon's initials images reviewed  Patient taken the operating general anesthesia.  She was in supine position with a leg holder on the left leg padded leg holder on the right leg  The left leg was prepped and draped sterilely Timeout was completed  Standard lateral portal was established the scope was placed into the joint a diagnostic arthroscopy was performed starting in the suprapatellar pouch advancing across the patellofemoral joint notch medial and lateral compartments medial lateral gutters  Pathology was found in the medial compartment so a spinal needle was used to make a medial portal and a curved up-biting meniscal forcep was used to morcellized the torn edges of the meniscus.  There was a horizontal component to the tear and the inferior surface was morselized as well.  A curved shaver torpedo was placed in the joint to remove the meniscal fragments and contour the remaining meniscus.  This was assisted with a 50 degree arthroscopic wand  Post shaving probing confirmed stable meniscal rim  The knee was irrigated dried and injected with Marcaine with epinephrine and portals were closed with two 3-0 nylon sutures  A sterile dressing was applied.  The Cryo/Cuff was placed and activated.  The patient was extubated and taken to recovery room in stable  condition  Postop plan weight-bear as tolerated  Follow-up in 1 week  SURGEON:  Surgeon(s) and Role:    * Carole Civil, MD - Primary  PHYSICIAN ASSISTANT:   ASSISTANTS: none   ANESTHESIA:   general  EBL:  none   BLOOD ADMINISTERED:none  DRAINS: none   LOCAL MEDICATIONS USED:  MARCAINE     SPECIMEN:  No Specimen  DISPOSITION OF SPECIMEN:  N/A  COUNTS:  YES  TOURNIQUET:  * No tourniquets in log *  DICTATION: .Dragon Dictation  PLAN OF CARE: Discharge to home after PACU  PATIENT DISPOSITION:  PACU - hemodynamically stable.   Delay start of Pharmacological VTE agent (>24hrs) due to surgical blood loss or risk of bleeding: not applicable

## 2022-02-27 NOTE — Interval H&P Note (Signed)
History and Physical Interval Note:  02/27/2022 12:20 PM  Karen Horn  has presented today for surgery, with the diagnosis of left knee torn medial meniscus.  The various methods of treatment have been discussed with the patient and family. After consideration of risks, benefits and other options for treatment, the patient has consented to  Procedure(s): KNEE ARTHROSCOPY WITH MEDIAL MENISCECTOMY (Left) as a surgical intervention.  The patient's history has been reviewed, patient examined, no change in status, stable for surgery.  I have reviewed the patient's chart and labs.  Questions were answered to the patient's satisfaction.     Arther Abbott

## 2022-02-27 NOTE — Anesthesia Preprocedure Evaluation (Signed)
Anesthesia Evaluation  Patient identified by MRN, date of birth, ID band Patient awake    Reviewed: Allergy & Precautions, H&P , NPO status , Patient's Chart, lab work & pertinent test results, reviewed documented beta blocker date and time   Airway Mallampati: II  TM Distance: >3 FB Neck ROM: full    Dental no notable dental hx.    Pulmonary neg pulmonary ROS   Pulmonary exam normal breath sounds clear to auscultation       Cardiovascular Exercise Tolerance: Good negative cardio ROS  Rhythm:regular Rate:Normal     Neuro/Psych  PSYCHIATRIC DISORDERS Anxiety Depression    negative neurological ROS  negative psych ROS   GI/Hepatic negative GI ROS, Neg liver ROS,GERD  ,,  Endo/Other  negative endocrine ROSdiabetesHypothyroidism    Renal/GU negative Renal ROS  negative genitourinary   Musculoskeletal   Abdominal   Peds  Hematology negative hematology ROS (+) Blood dyscrasia, anemia   Anesthesia Other Findings   Reproductive/Obstetrics negative OB ROS                             Anesthesia Physical Anesthesia Plan  ASA: 2  Anesthesia Plan: General and General LMA   Post-op Pain Management:    Induction:   PONV Risk Score and Plan: Ondansetron  Airway Management Planned:   Additional Equipment:   Intra-op Plan:   Post-operative Plan:   Informed Consent: I have reviewed the patients History and Physical, chart, labs and discussed the procedure including the risks, benefits and alternatives for the proposed anesthesia with the patient or authorized representative who has indicated his/her understanding and acceptance.     Dental Advisory Given  Plan Discussed with: CRNA  Anesthesia Plan Comments:        Anesthesia Quick Evaluation

## 2022-02-27 NOTE — Transfer of Care (Signed)
Immediate Anesthesia Transfer of Care Note  Patient: Karen Horn  Procedure(s) Performed: KNEE ARTHROSCOPY WITH MEDIAL MENISCECTOMY (Left: Knee)  Patient Location: PACU  Anesthesia Type:General  Level of Consciousness: awake  Airway & Oxygen Therapy: Patient Spontanous Breathing and Patient connected to nasal cannula oxygen  Post-op Assessment: Report given to RN and Post -op Vital signs reviewed and stable  Post vital signs: Reviewed and stable  Last Vitals:  Vitals Value Taken Time  BP 147/70 02/27/22 1336  Temp 36.7 C 02/27/22 1338  Pulse 94 02/27/22 1342  Resp 16 02/27/22 1342  SpO2 100 % 02/27/22 1342  Vitals shown include unvalidated device data.  Last Pain:  Vitals:   02/27/22 1338  TempSrc:   PainSc: Asleep         Complications: No notable events documented.

## 2022-02-27 NOTE — Telephone Encounter (Signed)
Patient had surgery today, she is wanting to know how long she has to leave the device on her knee and if she needs to sleep with it on.  She would like a call.  Pt's # [phone]

## 2022-02-28 ENCOUNTER — Encounter: Payer: Medicare Other | Admitting: Orthopedic Surgery

## 2022-02-28 ENCOUNTER — Telehealth: Payer: Self-pay | Admitting: Internal Medicine

## 2022-02-28 NOTE — Telephone Encounter (Signed)
Patient called in requesting call back   Patient was advised not to take   Semaglutide,0.25 or 0.5MG /DOS, (OZEMPIC, 0.25 OR 0.5 MG/DOSE,) 2 MG/3ML SOPN   Before surgery on 02/28/22. Surgery is now complete and patient wants to know when she should start the injection. Wants a call back.

## 2022-03-01 ENCOUNTER — Telehealth: Payer: Self-pay | Admitting: Orthopedic Surgery

## 2022-03-01 NOTE — Telephone Encounter (Signed)
Pt called back in regards to these medication

## 2022-03-01 NOTE — Telephone Encounter (Signed)
Patient Karen Horn, she has questions about the machine she has and if she still needs to keep her leg elevated.  Pt's # [phone]

## 2022-03-01 NOTE — Telephone Encounter (Signed)
I told her since she has some swelling to continue elevation She wants to use ice machine less I told her ok to take off and use it 4 times daily for 20 minutes

## 2022-03-01 NOTE — Telephone Encounter (Signed)
Patient advised.

## 2022-03-03 NOTE — Anesthesia Postprocedure Evaluation (Signed)
Anesthesia Post Note  Patient: Karen Horn  Procedure(s) Performed: KNEE ARTHROSCOPY WITH MEDIAL MENISCECTOMY (Left: Knee)  Patient location during evaluation: Phase II Anesthesia Type: General Level of consciousness: awake Pain management: pain level controlled Vital Signs Assessment: post-procedure vital signs reviewed and stable Respiratory status: spontaneous breathing and respiratory function stable Cardiovascular status: blood pressure returned to baseline and stable Postop Assessment: no headache and no apparent nausea or vomiting Anesthetic complications: no Comments: Late entry   No notable events documented.   Last Vitals:  Vitals:   02/27/22 1400 02/27/22 1417  BP: (!) 145/73 (!) 148/67  Pulse: 85 80  Resp: 19   Temp:  36.7 C  SpO2: 97% 95%    Last Pain:  Vitals:   02/27/22 1417  TempSrc: Oral  PainSc: 0-No pain                 Louann Sjogren

## 2022-03-05 ENCOUNTER — Encounter (HOSPITAL_COMMUNITY): Payer: Self-pay | Admitting: Orthopedic Surgery

## 2022-03-07 ENCOUNTER — Ambulatory Visit (INDEPENDENT_AMBULATORY_CARE_PROVIDER_SITE_OTHER): Payer: Medicare Other | Admitting: Orthopedic Surgery

## 2022-03-07 DIAGNOSIS — S83242D Other tear of medial meniscus, current injury, left knee, subsequent encounter: Secondary | ICD-10-CM

## 2022-03-07 NOTE — Progress Notes (Signed)
POST OP VISIT   Encounter Diagnosis  Name Primary?   Acute medial meniscus tear of left knee, subsequent encounter Yes   No chief complaint on file.   Procedure PRE-OPERATIVE DIAGNOSIS:  left knee torn medial meniscus   POST-OPERATIVE DIAGNOSIS:  left knee torn medial meniscus   PROCEDURE:  Procedure(s): KNEE ARTHROSCOPY WITH MEDIAL MENISCECTOMY (Left)- 29881   FINDINGS AT SURGERY    MEDIAL: Medial meniscus tear posterior horn with diffuse thinning of the articular cartilage   PTF: Chondromalacia grade 1 and 2   LATERAL normal articular surfaces and normal meniscus   NOTCH : Normal ACL and PCL  POD  # 1  POD 8   She is doing well.  She has a slight limp.  She is not using any assistive devices.  All sutures were removed.  She had some swelling of the portal sites but did not appear to have any effusion.  Her range of motion was 0-95.  Recommend quadricep strengthening exercises for the next 3 weeks and then the patient can come in for recheck.

## 2022-03-15 ENCOUNTER — Ambulatory Visit (INDEPENDENT_AMBULATORY_CARE_PROVIDER_SITE_OTHER): Payer: Medicare Other | Admitting: Internal Medicine

## 2022-03-15 ENCOUNTER — Encounter: Payer: Self-pay | Admitting: Internal Medicine

## 2022-03-15 ENCOUNTER — Ambulatory Visit (INDEPENDENT_AMBULATORY_CARE_PROVIDER_SITE_OTHER): Payer: Medicare Other

## 2022-03-15 ENCOUNTER — Telehealth: Payer: Self-pay | Admitting: Internal Medicine

## 2022-03-15 VITALS — BP 152/74 | HR 71 | Ht 60.5 in | Wt 113.0 lb

## 2022-03-15 VITALS — Ht 60.0 in | Wt 113.0 lb

## 2022-03-15 DIAGNOSIS — H524 Presbyopia: Secondary | ICD-10-CM | POA: Diagnosis not present

## 2022-03-15 DIAGNOSIS — E785 Hyperlipidemia, unspecified: Secondary | ICD-10-CM

## 2022-03-15 DIAGNOSIS — I10 Essential (primary) hypertension: Secondary | ICD-10-CM

## 2022-03-15 DIAGNOSIS — R110 Nausea: Secondary | ICD-10-CM | POA: Insufficient documentation

## 2022-03-15 DIAGNOSIS — H5213 Myopia, bilateral: Secondary | ICD-10-CM | POA: Diagnosis not present

## 2022-03-15 DIAGNOSIS — Z794 Long term (current) use of insulin: Secondary | ICD-10-CM | POA: Diagnosis not present

## 2022-03-15 DIAGNOSIS — E1169 Type 2 diabetes mellitus with other specified complication: Secondary | ICD-10-CM | POA: Diagnosis not present

## 2022-03-15 DIAGNOSIS — E119 Type 2 diabetes mellitus without complications: Secondary | ICD-10-CM

## 2022-03-15 DIAGNOSIS — H538 Other visual disturbances: Secondary | ICD-10-CM | POA: Diagnosis not present

## 2022-03-15 DIAGNOSIS — Z0000 Encounter for general adult medical examination without abnormal findings: Secondary | ICD-10-CM

## 2022-03-15 DIAGNOSIS — H04123 Dry eye syndrome of bilateral lacrimal glands: Secondary | ICD-10-CM | POA: Diagnosis not present

## 2022-03-15 MED ORDER — TIRZEPATIDE 2.5 MG/0.5ML ~~LOC~~ SOAJ: 2.5000 mg | SUBCUTANEOUS | 0 refills | Status: DC

## 2022-03-15 MED ORDER — ONDANSETRON HCL 4 MG PO TABS: 4.0000 mg | ORAL_TABLET | Freq: Three times a day (TID) | ORAL | 0 refills | Status: DC | PRN

## 2022-03-15 MED ORDER — ATORVASTATIN CALCIUM 20 MG PO TABS: 20.0000 mg | ORAL_TABLET | Freq: Every day | ORAL | 3 refills | Status: DC

## 2022-03-15 NOTE — Progress Notes (Signed)
Established Patient Office Visit  Subjective   Patient ID: Karen Horn, female    DOB: [birth year]  Age: 77 y.o. MRN: [id number]  Chief Complaint  Patient presents with   Diabetes    Follow up   Karen Horn returns to care today for DM follow-up.  She was last seen by me on 02/15/22 at which time we completed her annual exam.  Karen Horn was prescribed for improved treatment of diabetes.  4-week follow-up was arranged.  In the interim, it was discovered that her insurance would not cover Mounjaro and she was switched to [place].  She has also undergone medial meniscectomy of the left knee on 1/9.  There have otherwise been no acute interval events.  Karen Horn reports feeling fairly well today.  Her blood pressure is significantly elevated, which she attributes to stress in her personal life.  She has completed 2 Ozempic injections and endorses nausea for 1-2 days after completing the injection.  She has no additional concerns to discuss today.  Past Medical History:  Diagnosis Date   Diabetes mellitus without complication ([place])    Hypothyroidism    Thyroid disease    Past Surgical History:  Procedure Laterality Date   ABDOMINAL HYSTERECTOMY     ADENOIDECTOMY     BACK SURGERY     BIOPSY  09/06/2021   Procedure: BIOPSY;  Surgeon: Montez Morita, Quillian Quince, MD;  Location: [facility];  Service: Gastroenterology;;   ESOPHAGOGASTRODUODENOSCOPY (EGD) WITH PROPOFOL N/A 09/06/2021   Procedure: ESOPHAGOGASTRODUODENOSCOPY (EGD) WITH PROPOFOL;  Surgeon: Harvel Quale, MD;  Location: [facility];  Service: Gastroenterology;  Laterality: N/A;  215 ASA 2   KNEE ARTHROSCOPY WITH MEDIAL MENISECTOMY Left 02/27/2022   Procedure: KNEE ARTHROSCOPY WITH MEDIAL MENISCECTOMY;  Surgeon: Carole Civil, MD;  Location: [facility];  Service: Orthopedics;  Laterality: Left;   SINOSCOPY     TONSILLECTOMY     Social History   Tobacco Use   Smoking status: Never   Smokeless tobacco: Never   Vaping Use   Vaping Use: Never used  Substance Use Topics   Alcohol use: Not Currently   Drug use: Never   History reviewed. No pertinent family history. Allergies  Allergen Reactions   Codeine Rash    Other reaction(s): Chest Pain   Gabapentin Other (See Comments)    Other reaction(s): Other Depression, suicidal thoughts   Sulfa Antibiotics Rash and Other (See Comments)    chest Pain   Atorvastatin Calcium Other (See Comments)    Other reaction(s): Other myalgia   Escitalopram Other (See Comments)    Other reaction(s): Other Headaches   Ezetimibe Other (See Comments)    Other reaction(s): Other Myalgia-patient wants to retry generic 10/17   Fluvastatin Other (See Comments)    Other reaction(s): Other myalgia   Fosamax [Alendronate]     Other reaction(s): Other Leg pain   Levofloxacin Other (See Comments)    Other reaction(s): Other Muscle aching in her shoulders, inner thighs and legs   Pramipexole Nausea Only and Other (See Comments)    Headache    Prednisone Other (See Comments)    Unknown reaction type   Rosuvastatin Calcium Other (See Comments)    Other reaction(s): Other myalgia   Sertraline Other (See Comments)    Other reaction(s): Other Headaches   Simvastatin Other (See Comments)    Other reaction(s): Other myalgia   Sulfur Rash   Trazodone And Nefazodone Nausea Only      Review of Systems  Constitutional:  Negative for chills and fever.  HENT:  Negative for sore throat.   Respiratory:  Negative for cough and shortness of breath.   Cardiovascular:  Negative for chest pain, palpitations and leg swelling.  Gastrointestinal:  Negative for abdominal pain, blood in stool, constipation, diarrhea, nausea and vomiting.  Genitourinary:  Negative for dysuria and hematuria.  Musculoskeletal:  Negative for myalgias.  Skin:  Negative for itching and rash.  Neurological:  Negative for dizziness and headaches.  Psychiatric/Behavioral:  Negative for  depression and suicidal ideas.       Objective:     BP (!) 152/74   Pulse 71   Ht 5' 0.5" (1.537 m)   Wt 113 lb (51.3 kg)   SpO2 95%   BMI 21.71 kg/m  BP Readings from Last 3 Encounters:  03/15/22 (!) 152/74  02/27/22 (!) 148/67  02/23/22 118/70      Physical Exam Vitals reviewed.  Constitutional:      General: She is not in acute distress.    Appearance: Normal appearance. She is not toxic-appearing.  HENT:     Head: Normocephalic and atraumatic.     Right Ear: External ear normal.     Left Ear: External ear normal.     Nose: Nose normal. No congestion or rhinorrhea.     Mouth/Throat:     Mouth: Mucous membranes are moist.     Pharynx: Oropharynx is clear. No oropharyngeal exudate or posterior oropharyngeal erythema.  Eyes:     General: No scleral icterus.    Extraocular Movements: Extraocular movements intact.     Conjunctiva/sclera: Conjunctivae normal.     Pupils: Pupils are equal, round, and reactive to light.  Cardiovascular:     Rate and Rhythm: Normal rate and regular rhythm.     Pulses: Normal pulses.     Heart sounds: Normal heart sounds. No murmur heard.    No friction rub. No gallop.  Pulmonary:     Effort: Pulmonary effort is normal.     Breath sounds: Normal breath sounds. No wheezing, rhonchi or rales.  Abdominal:     General: Abdomen is flat. Bowel sounds are normal. There is no distension.     Palpations: Abdomen is soft.     Tenderness: There is no abdominal tenderness.  Musculoskeletal:        General: No swelling. Normal range of motion.     Cervical back: Normal range of motion.     Right lower leg: No edema.     Left lower leg: No edema.  Lymphadenopathy:     Cervical: No cervical adenopathy.  Skin:    General: Skin is warm and dry.     Capillary Refill: Capillary refill takes less than 2 seconds.     Coloration: Skin is not jaundiced.  Neurological:     General: No focal deficit present.     Mental Status: She is alert and  oriented to person, place, and time.  Psychiatric:        Mood and Affect: Mood normal.        Behavior: Behavior normal.   Last CBC Lab Results  Component Value Date   WBC 6.4 02/23/2022   HGB 13.6 02/23/2022   HCT 42.4 02/23/2022   MCV 90.6 02/23/2022   MCH 29.1 02/23/2022   RDW 13.6 02/23/2022   PLT 228 91/69/4503   Last metabolic panel Lab Results  Component Value Date   GLUCOSE 218 (H) 02/23/2022   NA 134 (L) 02/23/2022   K  4.0 02/23/2022   CL 104 02/23/2022   CO2 21 (L) 02/23/2022   BUN 14 02/23/2022   CREATININE 0.70 02/23/2022   GFRNONAA >60 02/23/2022   CALCIUM 9.3 02/23/2022   PROT 6.7 01/15/2022   ALBUMIN 4.3 01/15/2022   LABGLOB 2.4 01/15/2022   AGRATIO 1.8 01/15/2022   BILITOT 0.3 01/15/2022   ALKPHOS 47 01/15/2022   AST 17 01/15/2022   ALT 18 01/15/2022   ANIONGAP 9 02/23/2022   Last lipids Lab Results  Component Value Date   CHOL 206 (H) 01/15/2022   HDL 57 01/15/2022   LDLCALC 126 (H) 01/15/2022   TRIG 131 01/15/2022   CHOLHDL 3.6 01/15/2022   Last hemoglobin A1c Lab Results  Component Value Date   HGBA1C 8.5 (H) 01/15/2022   Last thyroid functions Lab Results  Component Value Date   TSH 1.590 01/15/2022   Last vitamin D Lab Results  Component Value Date   VD25OH 31.5 01/15/2022   Last vitamin B12 and Folate Lab Results  Component Value Date   VITAMINB12 397 01/15/2022   FOLATE 10.6 01/15/2022   The 10-year ASCVD risk score (Arnett DK, et al., 2019) is: 37.9%    Assessment & Plan:   Problem List Items Addressed This Visit       Type 2 diabetes mellitus (HCC)    A1c 8.5 in late November.  She is currently prescribed Tresiba 12 units twice daily, glipizide 10 mg daily, and Ozempic 0.25 mg weekly.  Her morning blood sugars are typically in the low 100s.  She has had multiple recent readings of 60-70.  Evening readings are increased to the 200s.  She endorses nausea for 1-2 days after injecting Ozempic. -Decrease evening  Tresiba 60 units -Continue glipizide at current dose -She has 2 additional Ozempic injections to complete before we would consider increasing 0.5 mg weekly.  She relates that her insurance may now cover [facility] since it is a new year.  She plans to call her insurance company to discuss this.  We will either plan to increase Ozempic to 0.5 mg weekly after she completes 2 additional injections, or switch to Mounjaro 2.5 mg weekly. -Follow-up in 4 weeks -I have prescribed Zofran for nausea relief      Hyperlipidemia associated with type 2 diabetes mellitus (HCC) - Primary    Lipid panel updated in the November.  Total cholesterol 206 and LDL 126.  Her 10-year ASCVD risk for today is 47.3%.  She is currently prescribed atorvastatin 10 mg MWF.  She has previously been on higher doses, but reports side effects of leg cramping. -Increase atorvastatin to 20 mg daily.  She believes leg cramping occurred at 40 mg.  Follow-up in 4 weeks for reassessment      Nausea    She endorses nausea without vomiting for 1 to 2 days after injecting Ozempic. -I have prescribed Zofran for as needed nausea relief       Return in about 4 weeks (around 04/12/2022) for DM.    Billie Lade, MD

## 2022-03-15 NOTE — Assessment & Plan Note (Signed)
A1c 8.5 in late November.  She is currently prescribed Tresiba 12 units twice daily, glipizide 10 mg daily, and Ozempic 0.25 mg weekly.  Her morning blood sugars are typically in the low 100s.  She has had multiple recent readings of 60-70.  Evening readings are increased to the 200s.  She endorses nausea for 1-2 days after injecting Ozempic. -Decrease evening Tresiba 60 units -Continue glipizide at current dose -She has 2 additional Ozempic injections to complete before we would consider increasing 0.5 mg weekly.  She relates that her insurance may now cover [facility] since it is a new year.  She plans to call her insurance company to discuss this.  We will either plan to increase Ozempic to 0.5 mg weekly after she completes 2 additional injections, or switch to Mounjaro 2.5 mg weekly. -Follow-up in 4 weeks -I have prescribed Zofran for nausea relief

## 2022-03-15 NOTE — Patient Instructions (Signed)
It was a pleasure to see you today.  Thank you for giving [place] the opportunity to be involved in your care.  Below is a brief recap of your visit and next steps.  We will plan to see you again in 1 month.  Summary Decrease Tresiba to 12 units in the morning and 6 units night Increase atorvastatin to 20 mg daily

## 2022-03-15 NOTE — Assessment & Plan Note (Signed)
Lipid panel updated in the November.  Total cholesterol 206 and LDL 126.  Her 10-year ASCVD risk for today is 47.3%.  She is currently prescribed atorvastatin 10 mg MWF.  She has previously been on higher doses, but reports side effects of leg cramping. -Increase atorvastatin to 20 mg daily.  She believes leg cramping occurred at 40 mg.  Follow-up in 4 weeks for reassessment

## 2022-03-15 NOTE — Telephone Encounter (Signed)
I have prescribed Mounjaro 2.5 mg weekly and discontinue Ozempic.  She should start Mounjaro 1 week from the date of her last Ozempic injection.

## 2022-03-15 NOTE — Assessment & Plan Note (Signed)
She endorses nausea without vomiting for 1 to 2 days after injecting Ozempic. -I have prescribed Zofran for as needed nausea relief

## 2022-03-15 NOTE — Patient Instructions (Signed)
  Karen Horn , Thank you for taking time to come for your Medicare Wellness Visit. I appreciate your ongoing commitment to your health goals. Please review the following plan we discussed and let me know if I can assist you in the future.   These are the goals we discussed:  Goals      Activity and Exercise Increased     Evidence-based guidance:  Review current exercise levels.  Assess patient perspective on exercise or activity level, barriers to increasing activity, motivation and readiness for change.  Recommend or set healthy exercise goal based on individual tolerance.  Encourage small steps toward making change in amount of exercise or activity.  Urge reduction of sedentary activities or screen time.  Promote group activities within the community or with family or support person.  Consider referral to rehabiliation therapist for assessment and exercise/activity plan.   Notes:         This is a list of the screening recommended for you and due dates:  Health Maintenance  Topic Date Due   COVID-19 Vaccine (1) Never done   DTaP/Tdap/Td vaccine (3 - Td or Tdap) 09/28/2015   Zoster (Shingles) Vaccine (1 of 2) 04/17/2022*   Colon Cancer Screening  01/16/2023*   Hemoglobin A1C  07/16/2022   Eye exam for diabetics  02/09/2023   Yearly kidney health urinalysis for diabetes  02/16/2023   Complete foot exam   02/16/2023   Yearly kidney function blood test for diabetes  02/24/2023   Medicare Annual Wellness Visit  03/16/2023   Pneumonia Vaccine  Completed   Flu Shot  Completed   DEXA scan (bone density measurement)  Completed   Hepatitis C Screening: USPSTF Recommendation to screen - Ages 67-79 yo.  Completed   HPV Vaccine  Aged Out  *Topic was postponed. The date shown is not the original due date.

## 2022-03-15 NOTE — Progress Notes (Signed)
Subjective:   Karen Horn is a 77 y.o. female who presents for Medicare Annual (Subsequent) preventive examination.  Review of Systems        Objective:    There were no vitals filed for this visit. There is no height or weight on file to calculate BMI.     02/27/2022   12:01 PM 02/23/2022    3:51 PM 09/06/2021    9:40 AM 03/08/2021   12:05 AM  Advanced Directives  Does Patient Have a Medical Advance Directive? No Yes No No  Does patient want to make changes to medical advance directive? No - Patient declined No - Patient declined    Would patient like information on creating a medical advance directive?   No - Patient declined     Current Medications (verified) Outpatient Encounter Medications as of 03/15/2022  Medication Sig   ACCU-CHEK AVIVA PLUS test strip 1 each by Other route 3 (three) times daily.   albuterol (VENTOLIN HFA) 108 (90 Base) MCG/ACT inhaler Inhale into the lungs.   ALPRAZolam (XANAX) 0.5 MG tablet Take 0.25 mg by mouth daily as needed for anxiety.   aspirin 81 MG chewable tablet Chew 81 mg by mouth in the morning.   atorvastatin (LIPITOR) 20 MG tablet Take 1 tablet (20 mg total) by mouth daily.   Blood Glucose Monitoring Suppl (ACCU-CHEK AVIVA PLUS) w/Device KIT by Does not apply route.   cholecalciferol (VITAMIN D) 25 MCG (1000 UNIT) tablet Take 2,000 Units by mouth in the morning.   EPINEPHrine 0.3 mg/0.3 mL IJ SOAJ injection epinephrine 0.3 mg/0.3 mL injection, auto-injector (Patient taking differently: Inject 0.3 mg into the muscle as needed for anaphylaxis.)   fluticasone (FLONASE) 50 MCG/ACT nasal spray Place into the nose.   glipiZIDE (GLUCOTROL XL) 5 MG 24 hr tablet Take 1 tablet (5 mg total) by mouth daily with breakfast. Take 5 mg by mouth daily with breakfast.   glucose blood (ACCU-CHEK AVIVA PLUS) test strip TEST SUGAR 3-4 TIMES A DAY   HYDROcodone-acetaminophen (NORCO/VICODIN) 5-325 MG tablet Take 1 tablet by mouth every 4 (four) hours as needed  for moderate pain.   ibuprofen (ADVIL) 800 MG tablet Take 1 tablet (800 mg total) by mouth every 8 (eight) hours as needed.   insulin degludec (TRESIBA FLEXTOUCH) 200 UNIT/ML FlexTouch Pen Inject 10 Units into the skin 2 (two) times daily.   Insulin Pen Needle (SURE COMFORT PEN NEEDLES) 31G X 5 MM MISC Sure Comfort Pen Needle 31 gauge x 5/16"   Lactobacillus (PROBIOTIC ACIDOPHILUS) CAPS Take 1 capsule by mouth in the morning.   levothyroxine (SYNTHROID) 75 MCG tablet Take 1 tablet (75 mcg total) by mouth daily before breakfast.   omeprazole (PRILOSEC) 20 MG capsule Take 1 capsule (20 mg total) by mouth daily.   ondansetron (ZOFRAN) 4 MG tablet Take 1 tablet (4 mg total) by mouth every 8 (eight) hours as needed for nausea or vomiting.   rOPINIRole (REQUIP) 0.25 MG tablet Take 0.25 mg by mouth at bedtime.   rOPINIRole (REQUIP) 0.5 MG tablet TAKE (1) TABLET BY MOUTH AT BEDTIME.   Semaglutide,0.25 or 0.5MG /DOS, (OZEMPIC, 0.25 OR 0.5 MG/DOSE,) 2 MG/3ML SOPN Inject 0.25 mg into the skin once a week.   sertraline (ZOLOFT) 25 MG tablet Take 1 tablet (25 mg total) by mouth at bedtime.   valACYclovir (VALTREX) 500 MG tablet Take 1 tablet (500 mg total) by mouth in the morning.   [DISCONTINUED] atorvastatin (LIPITOR) 10 MG tablet Take 10 mg by mouth  every Monday, Wednesday, and Friday.   No facility-administered encounter medications on file as of 03/15/2022.    Allergies (verified) Codeine, Gabapentin, Sulfa antibiotics, Atorvastatin calcium, Escitalopram, Ezetimibe, Fluvastatin, Fosamax [alendronate], Levofloxacin, Pramipexole, Prednisone, Rosuvastatin calcium, Sertraline, Simvastatin, and Trazodone and nefazodone   History: Past Medical History:  Diagnosis Date   Diabetes mellitus without complication ([place])    Hypothyroidism    Thyroid disease    Past Surgical History:  Procedure Laterality Date   ABDOMINAL HYSTERECTOMY     ADENOIDECTOMY     BACK SURGERY     BIOPSY  09/06/2021   Procedure:  BIOPSY;  Surgeon: Harvel Quale, MD;  Location: [facility];  Service: Gastroenterology;;   ESOPHAGOGASTRODUODENOSCOPY (EGD) WITH PROPOFOL N/A 09/06/2021   Procedure: ESOPHAGOGASTRODUODENOSCOPY (EGD) WITH PROPOFOL;  Surgeon: Harvel Quale, MD;  Location: [facility];  Service: Gastroenterology;  Laterality: N/A;  215 ASA 2   KNEE ARTHROSCOPY WITH MEDIAL MENISECTOMY Left 02/27/2022   Procedure: KNEE ARTHROSCOPY WITH MEDIAL MENISCECTOMY;  Surgeon: Carole Civil, MD;  Location: [facility];  Service: Orthopedics;  Laterality: Left;   SINOSCOPY     TONSILLECTOMY     No family history on file. Social History   Socioeconomic History   Marital status: Married    Spouse name: Not on file   Number of children: Not on file   Years of education: Not on file   Highest education level: Not on file  Occupational History   Not on file  Tobacco Use   Smoking status: Never   Smokeless tobacco: Never  Vaping Use   Vaping Use: Never used  Substance and Sexual Activity   Alcohol use: Not Currently   Drug use: Never   Sexual activity: Not on file  Other Topics Concern   Not on file  Social History Narrative   Not on file   Social Determinants of Health   Financial Resource Strain: Not on file  Food Insecurity: Not on file  Transportation Needs: Not on file  Physical Activity: Not on file  Stress: Not on file  Social Connections: Not on file    Tobacco Counseling Counseling given: Not Answered   Clinical Intake:   Diabetic? Yes  Activities of Daily Living    02/23/2022    3:54 PM 02/23/2022    3:51 PM  In your present state of health, do you have any difficulty performing the following activities:  Hearing?  0  Vision?  0  Difficulty concentrating or making decisions?  0  Walking or climbing stairs?  0  Dressing or bathing?  0  Doing errands, shopping? 0     Patient Care Team: Johnette Abraham, MD as PCP - General (Internal Medicine)  Indicate any  recent Medical Services you may have received from other than [facility] providers in the past year (date may be approximate).     Assessment:   This is a routine wellness examination for Karen Horn.  Hearing/Vision screen No results found.  Dietary issues and exercise activities discussed:     Goals Addressed   None   Depression Screen    03/15/2022    1:57 PM 02/15/2022    2:45 PM 02/06/2022    1:42 PM 01/15/2022    2:11 PM  PHQ 2/9 Scores  PHQ - 2 Score 1 1 0 0  PHQ- 9 Score 1 1 0 0    Fall Risk    03/15/2022    1:57 PM 02/15/2022    2:45 PM 02/06/2022  1:42 PM 01/15/2022    2:11 PM  Fall Risk   Falls in the past year? 0 0 0 0  Number falls in past yr: 0 0 0 0  Injury with Fall? 0 0 0 0  Risk for fall due to : No Fall Risks No Fall Risks No Fall Risks No Fall Risks  Follow up Falls evaluation completed Falls evaluation completed Falls evaluation completed Falls evaluation completed    [place]:  Any stairs in or around the home? Yes  If so, are there any without handrails? Yes  Home free of loose throw rugs in walkways, pet beds, electrical cords, etc? Yes  Adequate lighting in your home to reduce risk of falls? Yes   ASSISTIVE DEVICES UTILIZED TO PREVENT FALLS:  Life alert? No  Use of a cane, walker or w/c? No  Grab bars in the bathroom? Yes  Shower chair or bench in shower? Yes  Elevated toilet seat or a handicapped toilet? Yes   TIMED UP AND GO:  Was the test performed? Yes .  Length of time to ambulate 10 feet: 8 sec.   Gait steady and fast without use of assistive device  Cognitive Function:        Immunizations Immunization History  Administered Date(s) Administered   Influenza-Unspecified 11/29/2021    TDAP status: Due, Education has been provided regarding the importance of this vaccine. Advised may receive this vaccine at local pharmacy or Health Dept. Aware to provide a copy of the vaccination record if  obtained from local pharmacy or Health Dept. Verbalized acceptance and understanding.  Flu Vaccine status: Up to date  Pneumococcal vaccine status: Up to date  Covid-19 vaccine status: Declined, Education has been provided regarding the importance of this vaccine but patient still declined. Advised may receive this vaccine at local pharmacy or Health Dept.or vaccine clinic. Aware to provide a copy of the vaccination record if obtained from local pharmacy or Health Dept. Verbalized acceptance and understanding.  Qualifies for Shingles Vaccine? No   Zostavax completed No   Shingrix Completed?: No.    Education has been provided regarding the importance of this vaccine. Patient has been advised to call insurance company to determine out of pocket expense if they have not yet received this vaccine. Advised may also receive vaccine at local pharmacy or Health Dept. Verbalized acceptance and understanding.  Screening Tests Health Maintenance  Topic Date Due   COVID-19 Vaccine (1) Never done   DTaP/Tdap/Td (1 - Tdap) Never done   Zoster Vaccines- Shingrix (1 of 2) 04/17/2022 (Originally 10/07/1996)   Pneumonia Vaccine 24+ Years old (1 - PCV) 01/16/2023 (Originally 10/08/2011)   COLONOSCOPY (Pts 45-35yrs Insurance coverage will need to be confirmed)  01/16/2023 (Originally 10/08/1991)   HEMOGLOBIN A1C  07/16/2022   OPHTHALMOLOGY EXAM  02/09/2023   Diabetic kidney evaluation - Urine ACR  02/16/2023   FOOT EXAM  02/16/2023   Diabetic kidney evaluation - eGFR measurement  02/24/2023   Medicare Annual Wellness (AWV)  03/16/2023   INFLUENZA VACCINE  Completed   DEXA SCAN  Completed   Hepatitis C Screening  Completed   HPV VACCINES  Aged Out    Health Maintenance  Health Maintenance Due  Topic Date Due   COVID-19 Vaccine (1) Never done   DTaP/Tdap/Td (1 - Tdap) Never done    Colorectal cancer screening: Type of screening: Colonoscopy. Completed 2014. Repeat every 10 years  Mammogram status:  Completed 2023. Repeat every year  Bone Density status: Completed 2022. Results reflect: Bone density results: NORMAL. Repeat every 2 years.  Lung Cancer Screening: (Low Dose CT Chest recommended if Age 51-80 years, 30 pack-year currently smoking OR have quit w/in 15years.) does not qualify.   Lung Cancer Screening Referral: NA  Additional Screening:  Hepatitis C Screening: does not qualify; Completed 2023  Vision Screening: Recommended annual ophthalmology exams for early detection of glaucoma and other disorders of the eye. Is the patient up to date with their annual eye exam?  Yes  Who is the provider or what is the name of the office in which the patient attends annual eye exams? Dr. Katy Fitch If pt is not established with a provider, would they like to be referred to a provider to establish care? No .   Dental Screening: Recommended annual dental exams for proper oral hygiene  Community Resource Referral / Chronic Care Management: CRR required this visit?  No   CCM required this visit?  No      Plan:     I have personally reviewed and noted the following in the patient's chart:   Medical and social history Use of alcohol, tobacco or illicit drugs  Current medications and supplements including opioid prescriptions. Patient is not currently taking opioid prescriptions. Functional ability and status Nutritional status Physical activity Advanced directives List of other physicians Hospitalizations, surgeries, and ER visits in previous 12 months Vitals Screenings to include cognitive, depression, and falls Referrals and appointments  In addition, I have reviewed and discussed with patient certain preventive protocols, quality metrics, and best practice recommendations. A written personalized care plan for preventive services as well as general preventive health recommendations were provided to patient.     Smitty Knudsen, CMA   03/15/2022

## 2022-03-15 NOTE — Telephone Encounter (Signed)
Patient call back in regard to [facility]  Patient states price has dropped back down to $47. Patient would like to go ahead and order [facility].  Wants a call back in the=gad on when to stop current med.

## 2022-03-16 NOTE — Telephone Encounter (Signed)
Patient aware.

## 2022-03-19 ENCOUNTER — Other Ambulatory Visit: Payer: Self-pay | Admitting: Internal Medicine

## 2022-03-19 DIAGNOSIS — E119 Type 2 diabetes mellitus without complications: Secondary | ICD-10-CM

## 2022-03-19 MED ORDER — GLIPIZIDE ER 5 MG PO TB24: 5.0000 mg | ORAL_TABLET | Freq: Every day | ORAL | 0 refills | Status: DC

## 2022-03-22 ENCOUNTER — Other Ambulatory Visit: Payer: Self-pay

## 2022-03-23 ENCOUNTER — Telehealth: Payer: Self-pay | Admitting: Internal Medicine

## 2022-03-23 ENCOUNTER — Other Ambulatory Visit: Payer: Self-pay

## 2022-03-23 DIAGNOSIS — E119 Type 2 diabetes mellitus without complications: Secondary | ICD-10-CM

## 2022-03-23 MED ORDER — GLIPIZIDE ER 5 MG PO TB24: 5.0000 mg | ORAL_TABLET | Freq: Every day | ORAL | 0 refills | Status: DC

## 2022-03-23 NOTE — Telephone Encounter (Signed)
[employer] called, they need clarification on the dosage/strength for Glipizide.  [place] # [phone]

## 2022-03-23 NOTE — Telephone Encounter (Signed)
Spoke with pharmacy

## 2022-03-28 ENCOUNTER — Ambulatory Visit (INDEPENDENT_AMBULATORY_CARE_PROVIDER_SITE_OTHER): Payer: Medicare Other | Admitting: Orthopedic Surgery

## 2022-03-28 ENCOUNTER — Telehealth: Payer: Self-pay | Admitting: Internal Medicine

## 2022-03-28 DIAGNOSIS — S83242D Other tear of medial meniscus, current injury, left knee, subsequent encounter: Secondary | ICD-10-CM

## 2022-03-28 DIAGNOSIS — Z4889 Encounter for other specified surgical aftercare: Secondary | ICD-10-CM

## 2022-03-28 DIAGNOSIS — Z9889 Other specified postprocedural states: Secondary | ICD-10-CM

## 2022-03-28 NOTE — Progress Notes (Signed)
Chief Complaint  Patient presents with   Follow-up    Recheck on left knee, DOS 02-27-22.    Encounter Diagnoses  Name Primary?   S/P arthroscopy of left knee (02/27/22) Yes   Acute medial meniscus tear of left knee, subsequent encounter    Aftercare following surgery     Second postop visit status post arthroscopy left knee progressing well full range of motion restored no effusion just complains of soreness along the medial side of the joint which is most likely related to valgus stress on the knee  Recommend continue exercises use topical ointment on the medial side of the knee and see me in 4 weeks

## 2022-03-28 NOTE — Telephone Encounter (Signed)
Patient called in regard to " tirzepatide [facility]) 2.5 MG/0.5ML Pen "  Patient states that she cannot keep anything down. Patient is vomiting daily.   Patient wants a call back in regard.

## 2022-03-28 NOTE — Telephone Encounter (Signed)
Patient aware.

## 2022-04-12 ENCOUNTER — Encounter: Payer: Self-pay | Admitting: Internal Medicine

## 2022-04-12 ENCOUNTER — Ambulatory Visit (INDEPENDENT_AMBULATORY_CARE_PROVIDER_SITE_OTHER): Payer: Medicare Other | Admitting: Internal Medicine

## 2022-04-12 VITALS — BP 117/67 | HR 81 | Ht 60.5 in | Wt 112.8 lb

## 2022-04-12 DIAGNOSIS — E119 Type 2 diabetes mellitus without complications: Secondary | ICD-10-CM

## 2022-04-12 DIAGNOSIS — Z794 Long term (current) use of insulin: Secondary | ICD-10-CM

## 2022-04-12 NOTE — Assessment & Plan Note (Signed)
Last A1c 8.5 in November.  Her current insulin regimen includes Tresiba 12 units every morning and 6 units nightly.  She is additionally prescribed glipizide 5 mg daily.  She was recently switched from [place] to [facility] 2.5 mg weekly.  She experienced side effects of significant nausea after completing her first injection.  She has been checking her blood sugar twice daily.  AM readings reflect good control overall.  Her evening readings are increased.  Of note, her first Mounjaro injection was completed on 2/5.  She had significant improvement in her blood sugar readings for the next 6 days. -I have recommended that Karen Horn resume Mounjaro 2.5 mg weekly injections.  We discussed that nausea is a common side effect that should improve with time.  She has been prescribed as needed antiemetics to combat the symptoms.  She was instructed to discontinue [place] in the evening and glipizide in the morning if her morning blood sugar readings are less than 90.  We will tentatively plan to increase Mounjaro to 5 mg weekly after she completes 3 additional injections.  We discussed that the ultimate goal in starting [facility] is to improve her A1c control, she has been told that her A1c needs to be less than 7 in order for her to undergo hip arthroplasty, as well as getting her off of insulin and glipizide.  She is in agreement with this plan.

## 2022-04-12 NOTE — Patient Instructions (Signed)
It was a pleasure to see you today.  Thank you for giving [place] the opportunity to be involved in your care.  Below is a brief recap of your visit and next steps.  We will plan to see you again in 3 months.  Summary Resume Mounjaro 2.5 mg weekly. Let me know how things are going after 3 additional injections and we can increase to 5 mg weekly.  Stop tresiba at night and glipizide in the morning if your morning blood sugar readings are < 90

## 2022-04-12 NOTE — Progress Notes (Signed)
Established Patient Office Visit  Subjective   Patient ID: Karen Horn, female    DOB: [birth year]  Age: 77 y.o. MRN: [id number]  Chief Complaint  Patient presents with   Diabetes    Follow up   Karen Horn returns to care today for DM follow-up.  She was last seen by me on 1/25 at which time Ozempic was switched to [facility] 2.5 mg weekly.  I recommended that she decrease evening Tresiba to 60 units.  In the interim she has been seen by orthopedic surgery.  She also contacted our office on 2/7 to report that she had experienced significant nausea after completing her first Mounjaro injection.  She was instructed to hold future Mounjaro injections until she was seen by me today for follow-up.  There have otherwise been no acute interval events.  Karen Horn reports feeling well today.  She is asymptomatic and has no additional concerns to discuss.  Past Medical History:  Diagnosis Date   Diabetes mellitus without complication ([place])    Hypothyroidism    Thyroid disease    Past Surgical History:  Procedure Laterality Date   ABDOMINAL HYSTERECTOMY     ADENOIDECTOMY     BACK SURGERY     BIOPSY  09/06/2021   Procedure: BIOPSY;  Surgeon: Montez Morita, Quillian Quince, MD;  [facility];  Service: Gastroenterology;;   ESOPHAGOGASTRODUODENOSCOPY (EGD) WITH PROPOFOL N/A 09/06/2021   Procedure: ESOPHAGOGASTRODUODENOSCOPY (EGD) WITH PROPOFOL;  Surgeon: Harvel Quale, MD;  Location: [facility];  Service: Gastroenterology;  Laterality: N/A;  215 ASA 2   KNEE ARTHROSCOPY WITH MEDIAL MENISECTOMY Left 02/27/2022   Procedure: KNEE ARTHROSCOPY WITH MEDIAL MENISCECTOMY;  Surgeon: Carole Civil, MD;  Location: [facility];  Service: Orthopedics;  Laterality: Left;   SINOSCOPY     TONSILLECTOMY     Social History   Tobacco Use   Smoking status: Never   Smokeless tobacco: Never  Vaping Use   Vaping Use: Never used  Substance Use Topics   Alcohol use: Not Currently   Drug  use: Never   History reviewed. No pertinent family history. Allergies  Allergen Reactions   Codeine Rash    Other reaction(s): Chest Pain   Gabapentin Other (See Comments)    Other reaction(s): Other Depression, suicidal thoughts   Sulfa Antibiotics Rash and Other (See Comments)    chest Pain   Atorvastatin Calcium Other (See Comments)    Other reaction(s): Other myalgia   Escitalopram Other (See Comments)    Other reaction(s): Other Headaches   Ezetimibe Other (See Comments)    Other reaction(s): Other Myalgia-patient wants to retry generic 10/17   Fluvastatin Other (See Comments)    Other reaction(s): Other myalgia   Fosamax [Alendronate]     Other reaction(s): Other Leg pain   Levofloxacin Other (See Comments)    Other reaction(s): Other Muscle aching in her shoulders, inner thighs and legs   Pramipexole Nausea Only and Other (See Comments)    Headache    Prednisone Other (See Comments)    Unknown reaction type   Rosuvastatin Calcium Other (See Comments)    Other reaction(s): Other myalgia   Sertraline Other (See Comments)    Other reaction(s): Other Headaches   Simvastatin Other (See Comments)    Other reaction(s): Other myalgia   Sulfur Rash   Trazodone And Nefazodone Nausea Only   Review of Systems  Constitutional:  Negative for chills and fever.  HENT:  Negative for sore throat.   Respiratory:  Negative for cough and shortness of breath.   Cardiovascular:  Negative for chest pain, palpitations and leg swelling.  Gastrointestinal:  Negative for abdominal pain, blood in stool, constipation, diarrhea, nausea and vomiting.  Genitourinary:  Negative for dysuria and hematuria.  Musculoskeletal:  Negative for myalgias.  Skin:  Negative for itching and rash.  Neurological:  Negative for dizziness and headaches.  Psychiatric/Behavioral:  Negative for depression and suicidal ideas.      Objective:     BP 117/67   Pulse 81   Ht 5' 0.5" (1.537 m)   Wt 112  lb 12.8 oz (51.2 kg)   SpO2 97%   BMI 21.67 kg/m  BP Readings from Last 3 Encounters:  04/12/22 117/67  03/15/22 (!) 152/74  02/27/22 (!) 148/67   Physical Exam Vitals reviewed.  Constitutional:      General: She is not in acute distress.    Appearance: Normal appearance. She is not toxic-appearing.  HENT:     Head: Normocephalic and atraumatic.     Right Ear: External ear normal.     Left Ear: External ear normal.     Nose: Nose normal. No congestion or rhinorrhea.     Mouth/Throat:     Mouth: Mucous membranes are moist.     Pharynx: Oropharynx is clear. No oropharyngeal exudate or posterior oropharyngeal erythema.  Eyes:     General: No scleral icterus.    Extraocular Movements: Extraocular movements intact.     Conjunctiva/sclera: Conjunctivae normal.     Pupils: Pupils are equal, round, and reactive to light.  Cardiovascular:     Rate and Rhythm: Normal rate and regular rhythm.     Pulses: Normal pulses.     Heart sounds: Normal heart sounds. No murmur heard.    No friction rub. No gallop.  Pulmonary:     Effort: Pulmonary effort is normal.     Breath sounds: Normal breath sounds. No wheezing, rhonchi or rales.  Abdominal:     General: Abdomen is flat. Bowel sounds are normal. There is no distension.     Palpations: Abdomen is soft.     Tenderness: There is no abdominal tenderness.  Musculoskeletal:        General: No swelling. Normal range of motion.     Cervical back: Normal range of motion.     Right lower leg: No edema.     Left lower leg: No edema.  Lymphadenopathy:     Cervical: No cervical adenopathy.  Skin:    General: Skin is warm and dry.     Capillary Refill: Capillary refill takes less than 2 seconds.     Coloration: Skin is not jaundiced.  Neurological:     General: No focal deficit present.     Mental Status: She is alert and oriented to person, place, and time.  Psychiatric:        Mood and Affect: Mood normal.        Behavior: Behavior  normal.   Last CBC Lab Results  Component Value Date   WBC 6.4 02/23/2022   HGB 13.6 02/23/2022   HCT 42.4 02/23/2022   MCV 90.6 02/23/2022   MCH 29.1 02/23/2022   RDW 13.6 02/23/2022   PLT 228 [id number]   Last metabolic panel Lab Results  Component Value Date   GLUCOSE 218 (H) 02/23/2022   NA 134 (L) 02/23/2022   K 4.0 02/23/2022   CL 104 02/23/2022   CO2 21 (L) 02/23/2022   BUN 14 02/23/2022  CREATININE 0.70 02/23/2022   GFRNONAA >60 02/23/2022   CALCIUM 9.3 02/23/2022   PROT 6.7 01/15/2022   ALBUMIN 4.3 01/15/2022   LABGLOB 2.4 01/15/2022   AGRATIO 1.8 01/15/2022   BILITOT 0.3 01/15/2022   ALKPHOS 47 01/15/2022   AST 17 01/15/2022   ALT 18 01/15/2022   ANIONGAP 9 02/23/2022   Last lipids Lab Results  Component Value Date   CHOL 206 (H) 01/15/2022   HDL 57 01/15/2022   LDLCALC 126 (H) 01/15/2022   TRIG 131 01/15/2022   CHOLHDL 3.6 01/15/2022   Last hemoglobin A1c Lab Results  Component Value Date   HGBA1C 8.5 (H) 01/15/2022   Last thyroid functions Lab Results  Component Value Date   TSH 1.590 01/15/2022   Last vitamin D Lab Results  Component Value Date   VD25OH 31.5 01/15/2022   Last vitamin B12 and Folate Lab Results  Component Value Date   [id number] 01/15/2022   FOLATE 10.6 01/15/2022   The 10-year ASCVD risk score (Arnett DK, et al., 2019) is: 24.8%    Assessment & Plan:   Problem List Items Addressed This Visit       Type 2 diabetes mellitus ([place]) - Primary    Last A1c 8.5 in November.  Her current insulin regimen includes Tresiba 12 units every morning and 6 units nightly.  She is additionally prescribed glipizide 5 mg daily.  She was recently switched from [place] to [facility] 2.5 mg weekly.  She experienced side effects of significant nausea after completing her first injection.  She has been checking her blood sugar twice daily.  AM readings reflect good control overall.  Her evening readings are increased.  Of note, her  first Mounjaro injection was completed on 2/5.  She had significant improvement in her blood sugar readings for the next 6 days. -I have recommended that Ms. Schiraldi resume Mounjaro 2.5 mg weekly injections.  We discussed that nausea is a common side effect that should improve with time.  She has been prescribed as needed antiemetics to combat the symptoms.  She was instructed to discontinue [place] in the evening and glipizide in the morning if her morning blood sugar readings are less than 90.  We will tentatively plan to increase Mounjaro to 5 mg weekly after she completes 3 additional injections.  We discussed that the ultimate goal in starting [facility] is to improve her A1c control, she has been told that her A1c needs to be less than 7 in order for her to undergo hip arthroplasty, as well as getting her off of insulin and glipizide.  She is in agreement with this plan.       Return in about 3 months (around 07/11/2022).    Johnette Abraham, MD

## 2022-04-16 ENCOUNTER — Other Ambulatory Visit: Payer: Self-pay

## 2022-04-16 ENCOUNTER — Telehealth: Payer: Self-pay | Admitting: Pharmacy Technician

## 2022-04-16 NOTE — Telephone Encounter (Signed)
Auth Submission: APPROVED '@AP'$ - CHANGE IN PROVIDER. NEW AUTH SUBMITTED Payer: [employer] MEDICARE Medication & CPT/J Code(s) submitted: Prolia (Denosumab) [id number] Route of submission (phone, fax, portal):  Phone # Fax # Auth type: Buy/Bill Units/visits requested: 2 Reference number: [id number] Approval from: 04/16/22 to 04/17/23

## 2022-04-17 ENCOUNTER — Other Ambulatory Visit: Payer: Self-pay

## 2022-04-17 ENCOUNTER — Encounter: Payer: Self-pay | Admitting: Internal Medicine

## 2022-04-17 MED ORDER — INSULIN DEGLUDEC 100 UNIT/ML ~~LOC~~ SOLN: 0.1000 mL | Freq: Every day | SUBCUTANEOUS | 3 refills | Status: DC

## 2022-04-17 MED ORDER — INSULIN DEGLUDEC 100 UNIT/ML ~~LOC~~ SOLN: 10.0000 mL | Freq: Every day | SUBCUTANEOUS | 3 refills | Status: DC

## 2022-04-17 MED ORDER — SURE COMFORT PEN NEEDLES 31G X 5 MM MISC: 0 refills | Status: AC

## 2022-04-17 MED ORDER — TRESIBA FLEXTOUCH 200 UNIT/ML ~~LOC~~ SOPN: 10.0000 [IU] | PEN_INJECTOR | Freq: Two times a day (BID) | SUBCUTANEOUS | 0 refills | Status: DC

## 2022-04-19 ENCOUNTER — Encounter: Payer: Self-pay | Admitting: Radiology

## 2022-04-25 ENCOUNTER — Ambulatory Visit (INDEPENDENT_AMBULATORY_CARE_PROVIDER_SITE_OTHER): Payer: Medicare Other | Admitting: Orthopedic Surgery

## 2022-04-25 DIAGNOSIS — Z9889 Other specified postprocedural states: Secondary | ICD-10-CM

## 2022-04-25 NOTE — Progress Notes (Signed)
Chief Complaint  Patient presents with   Routine Post Op    LEFT KNEE DOS 02/27/22   Encounter Diagnosis  Name Primary?   S/P arthroscopy of left knee (02/27/22) Yes    77 year old female status post left knee arthroscopy  She is still having some pain on the front of her knee and on the medial side of the knee she had a medial meniscectomy  She says she stepped down off of a stepladder and felt some increased pain yesterday  On exam she walks with a slight limp she does not quite fully extend the knee.  She has tenderness on the medial joint line and anteromedial tibia  She does have vitamin D deficiency with a normal vitamin D on last lab report  She also takes injections for osteoporosis  Her range of motion is good she does not have an effusion  I think she is just having some arthritis pain but does not want to take any medicine  Recommend 500 mg of Tylenol twice a day and follow-up in 6 weeks

## 2022-05-01 ENCOUNTER — Encounter (HOSPITAL_COMMUNITY)
Admission: RE | Admit: 2022-05-01 | Discharge: 2022-05-01 | Disposition: A | Discharge status: Home / Self Care | Payer: Medicare Other | Source: Ambulatory Visit | Attending: Adult Health Nurse Practitioner | Admitting: Adult Health Nurse Practitioner

## 2022-05-01 VITALS — BP 132/61 | HR 74 | Temp 97.9°F | Resp 17

## 2022-05-01 DIAGNOSIS — M8589 Other specified disorders of bone density and structure, multiple sites: Secondary | ICD-10-CM | POA: Insufficient documentation

## 2022-05-01 DIAGNOSIS — M816 Localized osteoporosis [Lequesne]: Secondary | ICD-10-CM | POA: Diagnosis not present

## 2022-05-01 MED ORDER — DENOSUMAB 60 MG/ML ~~LOC~~ SOSY
60.0000 mg | PREFILLED_SYRINGE | Freq: Once | SUBCUTANEOUS | Status: AC
  Administered 2022-05-01: 60 mg via SUBCUTANEOUS

## 2022-05-01 NOTE — Progress Notes (Signed)
Diagnosis: Osteoporosis  Provider:  Pablo Lawrence NP  Procedure: Injection  Prolia (Denosumab), Dose: 60 mg, Site: subcutaneous, Number of injections: 1  Post Care: Patient declined observation  Discharge: Condition: Good, Destination: Home . AVS Provided  Performed by:  Kennith Maes, RN

## 2022-05-01 NOTE — Addendum Note (Signed)
Encounter addended by: Baxter Hire, RN on: 05/01/2022 1:53 PM  Actions taken: Therapy plan modified

## 2022-05-04 ENCOUNTER — Encounter: Payer: Self-pay | Admitting: Internal Medicine

## 2022-05-04 ENCOUNTER — Ambulatory Visit (INDEPENDENT_AMBULATORY_CARE_PROVIDER_SITE_OTHER): Payer: Medicare Other | Admitting: Internal Medicine

## 2022-05-04 VITALS — BP 138/70 | HR 79 | Ht 60.5 in | Wt 111.2 lb

## 2022-05-04 DIAGNOSIS — G2581 Restless legs syndrome: Secondary | ICD-10-CM | POA: Diagnosis not present

## 2022-05-04 DIAGNOSIS — E785 Hyperlipidemia, unspecified: Secondary | ICD-10-CM | POA: Diagnosis not present

## 2022-05-04 DIAGNOSIS — Z794 Long term (current) use of insulin: Secondary | ICD-10-CM | POA: Diagnosis not present

## 2022-05-04 DIAGNOSIS — E039 Hypothyroidism, unspecified: Secondary | ICD-10-CM | POA: Diagnosis not present

## 2022-05-04 DIAGNOSIS — E1169 Type 2 diabetes mellitus with other specified complication: Secondary | ICD-10-CM | POA: Diagnosis not present

## 2022-05-04 DIAGNOSIS — E119 Type 2 diabetes mellitus without complications: Secondary | ICD-10-CM | POA: Diagnosis not present

## 2022-05-04 MED ORDER — TIRZEPATIDE 5 MG/0.5ML ~~LOC~~ SOAJ: 5.0000 mg | SUBCUTANEOUS | 1 refills | Status: DC

## 2022-05-04 MED ORDER — LEVOTHYROXINE SODIUM 75 MCG PO TABS: 75.0000 ug | ORAL_TABLET | Freq: Every day | ORAL | 1 refills | Status: DC

## 2022-05-04 MED ORDER — ROPINIROLE HCL 1 MG PO TABS: 1.0000 mg | ORAL_TABLET | Freq: Three times a day (TID) | ORAL | 2 refills | Status: DC

## 2022-05-04 NOTE — Assessment & Plan Note (Signed)
She is currently prescribed ropinirole 0.5 mg nightly.  Today she states that her symptoms are poorly controlled and she would like to increase ropinirole. -Increase ropinirole to 1 mg nightly -Follow-up in May for reassessment

## 2022-05-04 NOTE — Assessment & Plan Note (Signed)
Returning today for DM follow-up.  She is currently prescribed Mounjaro 2.5 mg weekly and Tresiba 10 units every morning.  Glipizide was recently discontinued.  She was able to tolerate 3 additional Mounjaro injections since her last appointment.  She believes that discontinuing glipizide has helped with her tolerance of Mounjaro.  A.m. blood sugar readings are mostly within goal.  Evening readings remain elevated. -Increase Mounjaro to 5 mg weekly.  Zofran is available for as needed nausea relief. -Continue Lantus 10 units every morning -We will tentatively plan for follow-up in May.  Repeat A1c and lipid panel to be completed prior to her appointment.

## 2022-05-04 NOTE — Patient Instructions (Signed)
It was a pleasure to see you today.  Thank you for giving [place] the opportunity to be involved in your care.  Below is a brief recap of your visit and next steps.  We will plan to see you again in May.  Summary Discontinue glipizide Increase Mounjaro to 5 mg weekly Increase ropinirole to 1 mg nightly Follow up scheduled for 5/24

## 2022-05-04 NOTE — Progress Notes (Signed)
Established Patient Office Visit  Subjective   Patient ID: Karen Horn, female    DOB: 01-08-1946  Age: 77 y.o. MRN: [id number]  Chief Complaint  Patient presents with   Diabetes    Follow up   Karen Horn returns to care today for DM follow-up.  Karen Horn was last seen by me on 2/22 at which time Karen Horn endorsed side effects of nausea after completing Mounjaro injections.  Karen Horn completed 1 injection but had stopped for 2 weeks prior to her appointment due to side effects.  I recommended resuming Mounjaro, discontinue glipizide, and discontinuing her evening dose of Tresiba.  Zofran was prescribed for as needed nausea relief.  There have been no acute interval events.  Karen Horn reports feeling fairly well today.  Karen Horn has completed 3 additional Mounjaro injections with tolerable side effects.  Her blood sugars are improving.  Her acute concern today is poorly controlled symptoms of RLS.  Karen Horn is currently prescribed ropinirole 0.5 mg nightly.  Karen Horn is interested in increasing this medication today.  Past Medical History:  Diagnosis Date   Diabetes mellitus without complication ([place])    Hypothyroidism    Thyroid disease    Past Surgical History:  Procedure Laterality Date   ABDOMINAL HYSTERECTOMY     ADENOIDECTOMY     BACK SURGERY     BIOPSY  09/06/2021   Procedure: BIOPSY;  Surgeon: Montez Morita, Quillian Quince, MD;  [facility];  Service: Gastroenterology;;   ESOPHAGOGASTRODUODENOSCOPY (EGD) WITH PROPOFOL N/A 09/06/2021   Procedure: ESOPHAGOGASTRODUODENOSCOPY (EGD) WITH PROPOFOL;  Surgeon: Harvel Quale, MD;  [facility];  Service: Gastroenterology;  Laterality: N/A;  215 ASA 2   KNEE ARTHROSCOPY WITH MEDIAL MENISECTOMY Left 02/27/2022   Procedure: KNEE ARTHROSCOPY WITH MEDIAL MENISCECTOMY;  Surgeon: Carole Civil, MD;  Location: [facility];  Service: Orthopedics;  Laterality: Left;   SINOSCOPY     TONSILLECTOMY     Social History   Tobacco Use    Smoking status: Never   Smokeless tobacco: Never  Vaping Use   Vaping Use: Never used  Substance Use Topics   Alcohol use: Not Currently   Drug use: Never   History reviewed. No pertinent family history. Allergies  Allergen Reactions   Codeine Rash    Other reaction(s): Chest Pain   Gabapentin Other (See Comments)    Other reaction(s): Other Depression, suicidal thoughts   Sulfa Antibiotics Rash and Other (See Comments)    chest Pain   Atorvastatin Calcium Other (See Comments)    Other reaction(s): Other myalgia   Escitalopram Other (See Comments)    Other reaction(s): Other Headaches   Ezetimibe Other (See Comments)    Other reaction(s): Other Myalgia-patient wants to retry generic 10/17   Fluvastatin Other (See Comments)    Other reaction(s): Other myalgia   Fosamax [Alendronate]     Other reaction(s): Other Leg pain   Levofloxacin Other (See Comments)    Other reaction(s): Other Muscle aching in her shoulders, inner thighs and legs   Pramipexole Nausea Only and Other (See Comments)    Headache    Prednisone Other (See Comments)    Unknown reaction type   Rosuvastatin Calcium Other (See Comments)    Other reaction(s): Other myalgia   Sertraline Other (See Comments)    Other reaction(s): Other Headaches   Simvastatin Other (See Comments)    Other reaction(s): Other myalgia   Sulfur Rash   Trazodone And Nefazodone Nausea Only   Review of Systems  Neurological:        RLS  All other systems reviewed and are negative.    Objective:     BP 138/70   Pulse 79   Ht 5' 0.5" (1.537 m)   Wt 111 lb 3.2 oz (50.4 kg)   SpO2 96%   BMI 21.36 kg/m  BP Readings from Last 3 Encounters:  05/04/22 138/70  05/01/22 132/61  04/12/22 117/67   Physical Exam Vitals reviewed.  Constitutional:      General: Karen Horn is not in acute distress.    Appearance: Normal appearance. Karen Horn is not toxic-appearing.  HENT:     Head: Normocephalic and atraumatic.     Right Ear:  External ear normal.     Left Ear: External ear normal.     Nose: Nose normal. No congestion or rhinorrhea.     Mouth/Throat:     Mouth: Mucous membranes are moist.     Pharynx: Oropharynx is clear. No oropharyngeal exudate or posterior oropharyngeal erythema.  Eyes:     General: No scleral icterus.    Extraocular Movements: Extraocular movements intact.     Conjunctiva/sclera: Conjunctivae normal.     Pupils: Pupils are equal, round, and reactive to light.  Cardiovascular:     Rate and Rhythm: Normal rate and regular rhythm.     Pulses: Normal pulses.     Heart sounds: Normal heart sounds. No murmur heard.    No friction rub. No gallop.  Pulmonary:     Effort: Pulmonary effort is normal.     Breath sounds: Normal breath sounds. No wheezing, rhonchi or rales.  Abdominal:     General: Abdomen is flat. Bowel sounds are normal. There is no distension.     Palpations: Abdomen is soft.     Tenderness: There is no abdominal tenderness.  Musculoskeletal:        General: No swelling. Normal range of motion.     Cervical back: Normal range of motion.     Right lower leg: No edema.     Left lower leg: No edema.  Lymphadenopathy:     Cervical: No cervical adenopathy.  Skin:    General: Skin is warm and dry.     Capillary Refill: Capillary refill takes less than 2 seconds.     Coloration: Skin is not jaundiced.  Neurological:     General: No focal deficit present.     Mental Status: Karen Horn is alert and oriented to person, place, and time.  Psychiatric:        Mood and Affect: Mood normal.        Behavior: Behavior normal.   Last CBC Lab Results  Component Value Date   WBC 6.4 02/23/2022   HGB 13.6 02/23/2022   HCT 42.4 02/23/2022   MCV 90.6 02/23/2022   MCH 29.1 02/23/2022   RDW 13.6 02/23/2022   PLT 228 [id number]   Last metabolic panel Lab Results  Component Value Date   GLUCOSE 218 (H) 02/23/2022   NA 134 (L) 02/23/2022   K 4.0 02/23/2022   CL 104 02/23/2022   CO2 21  (L) 02/23/2022   BUN 14 02/23/2022   CREATININE 0.70 02/23/2022   GFRNONAA >60 02/23/2022   CALCIUM 9.3 02/23/2022   PROT 6.7 01/15/2022   ALBUMIN 4.3 01/15/2022   LABGLOB 2.4 01/15/2022   AGRATIO 1.8 01/15/2022   BILITOT 0.3 01/15/2022   ALKPHOS 47 01/15/2022   AST 17 01/15/2022   ALT 18 01/15/2022   ANIONGAP 9 02/23/2022  Last lipids Lab Results  Component Value Date   CHOL 206 (H) 01/15/2022   HDL 57 01/15/2022   LDLCALC 126 (H) 01/15/2022   TRIG 131 01/15/2022   CHOLHDL 3.6 01/15/2022   Last hemoglobin A1c Lab Results  Component Value Date   HGBA1C 8.5 (H) 01/15/2022   Last thyroid functions Lab Results  Component Value Date   TSH 1.590 01/15/2022   Last vitamin D Lab Results  Component Value Date   VD25OH 31.5 01/15/2022   Last vitamin B12 and Folate Lab Results  Component Value Date   [id number] 01/15/2022   FOLATE 10.6 01/15/2022   The 10-year ASCVD risk score (Arnett DK, et al., 2019) is: 32.6%    Assessment & Plan:   Problem List Items Addressed This Visit       Type 2 diabetes mellitus ([place])    Returning today for DM follow-up.  Karen Horn is currently prescribed Mounjaro 2.5 mg weekly and Tresiba 10 units every morning.  Glipizide was recently discontinued.  Karen Horn was able to tolerate 3 additional Mounjaro injections since her last appointment.  Karen Horn believes that discontinuing glipizide has helped with her tolerance of Mounjaro.  A.m. blood sugar readings are mostly within goal.  Evening readings remain elevated. -Increase Mounjaro to 5 mg weekly.  Zofran is available for as needed nausea relief. -Continue Lantus 10 units every morning -We will tentatively plan for follow-up in May.  Repeat A1c and lipid panel to be completed prior to her appointment.      Restless legs - Primary    Karen Horn is currently prescribed ropinirole 0.5 mg nightly.  Today Karen Horn states that her symptoms are poorly controlled and Karen Horn would like to increase ropinirole. -Increase  ropinirole to 1 mg nightly -Follow-up in May for reassessment      Return in about 10 weeks (around 07/13/2022).   Johnette Abraham, MD

## 2022-05-15 ENCOUNTER — Other Ambulatory Visit: Payer: Self-pay | Admitting: Internal Medicine

## 2022-05-15 ENCOUNTER — Encounter: Payer: Self-pay | Admitting: Internal Medicine

## 2022-05-15 DIAGNOSIS — E119 Type 2 diabetes mellitus without complications: Secondary | ICD-10-CM

## 2022-05-15 MED ORDER — METFORMIN HCL ER 500 MG PO TB24: ORAL_TABLET | ORAL | 0 refills | Status: DC

## 2022-05-15 NOTE — Progress Notes (Signed)
Called patient to discuss medication concerns.  She is currently prescribed Mounjaro 5 mg weekly.  Her last injection was completed yesterday (3/25).  She endorses significant side effects since starting Mounjaro including nausea with vomiting, abdominal pain, diarrhea, and headaches.  She completes her injections on Mondays and states that she typically stays in bed for 2 days after completing injections.  Blood sugars are improving, but Mounjaro is negatively impacting her quality of life.  Through shared decision making, Karen Horn has been discontinued.  We will restart metformin XR, ramping up to 1000 mg twice daily over the course of 4 weeks.  She was previously prescribed metformin and tolerated it well.  She reports that it was discontinued by her previous PCP in favor of glipizide due to concern for potential side effects.  She is in agreement with restarting metformin today.

## 2022-05-18 ENCOUNTER — Encounter: Payer: Self-pay | Admitting: Internal Medicine

## 2022-06-06 ENCOUNTER — Ambulatory Visit: Payer: Medicare Other | Admitting: Orthopedic Surgery

## 2022-06-07 ENCOUNTER — Encounter: Payer: Self-pay | Admitting: Orthopedic Surgery

## 2022-06-07 ENCOUNTER — Ambulatory Visit: Payer: Medicare Other | Admitting: Orthopedic Surgery

## 2022-06-07 DIAGNOSIS — M25562 Pain in left knee: Secondary | ICD-10-CM | POA: Diagnosis not present

## 2022-06-07 DIAGNOSIS — Z9889 Other specified postprocedural states: Secondary | ICD-10-CM

## 2022-06-07 DIAGNOSIS — M7042 Prepatellar bursitis, left knee: Secondary | ICD-10-CM | POA: Diagnosis not present

## 2022-06-07 MED ORDER — METHYLPREDNISOLONE ACETATE 40 MG/ML IJ SUSP
40.0000 mg | Freq: Once | INTRAMUSCULAR | Status: AC
  Administered 2022-06-07: 40 mg via INTRA_ARTICULAR

## 2022-06-07 NOTE — Progress Notes (Signed)
Chief Complaint  Patient presents with   Post-op Follow-up    Left knee 02/27/22 has pain medial knee joint/ patella pops     This 77 years old female had knee arthroscopy in January she had a isolated medial meniscal tear and mild chondritis of the medial and patellofemoral joint.  She never really got complete pain relief.  She complains of popping and pain behind her kneecap and giving way with locking related to the patellofemoral popping  She also has some medial pain just below the joint line and pain radiating up the medial side of her thigh       Physical Exam Vitals and nursing note reviewed.  Constitutional:      Appearance: Normal appearance.  HENT:     Head: Normocephalic and atraumatic.  Eyes:     General: No scleral icterus.       Right eye: No discharge.        Left eye: No discharge.     Extraocular Movements: Extraocular movements intact.     Conjunctiva/sclera: Conjunctivae normal.     Pupils: Pupils are equal, round, and reactive to light.  Musculoskeletal:     Comments: On examination she ambulates with a slight limp  Left knee  No effusion  Medial joint line and McMurray signs are normal  She has an audible pop when she extends her patella  She is tender around the patellofemoral joint she has patellofemoral grinding compression test is positive  She has medial tenderness over the Pez anserine bursa    Skin:    General: Skin is warm and dry.     Capillary Refill: Capillary refill takes less than 2 seconds.  Neurological:     General: No focal deficit present.     Mental Status: She is alert and oriented to person, place, and time.     Gait: Gait abnormal.  Psychiatric:        Mood and Affect: Mood normal.        Behavior: Behavior normal.        Thought Content: Thought content normal.        Judgment: Judgment normal.    Assessment and plan  Status post arthroscopy left knee partial medial meniscectomy  Postop postarthroscopy  pain  Pez anserine bursitis  Patellofemoral chondrosis advance   Recommendations and plan  2 injections 1 in the bursa 1 in the joint  Will allow that to marinade for 4 weeks and then recheck may need another arthroscopy  Procedure note left knee injection   verbal consent was obtained to inject left knee joint  Timeout was completed to confirm the site of injection  The medications used were depomedrol 40 mg and 1% lidocaine 3 cc Anesthesia was provided by ethyl chloride and the skin was prepped with alcohol.  After cleaning the skin with alcohol a 20-gauge needle was used to inject the left knee joint. There were no complications. A sterile bandage was applied.    Procedure note Left knee injection for bursitis   verbal consent was obtained to inject Left knee PES BURSA  Timeout was completed to confirm the site of injection  The medications used were 40 mg of Depo-Medrol and 1% lidocaine 3 cc  Anesthesia was provided by ethyl chloride and the skin was prepped with alcohol.  After cleaning the skin with alcohol a 25-gauge needle was used to inject the left knee bursa   There were no complications and a sterile bandage was applied

## 2022-06-07 NOTE — Addendum Note (Signed)
Addended by: Louvenia Golomb W on: 06/07/2022 10:25 AM   Modules accepted: Orders  

## 2022-06-11 ENCOUNTER — Encounter: Payer: Self-pay | Admitting: Internal Medicine

## 2022-06-12 DIAGNOSIS — Z794 Long term (current) use of insulin: Secondary | ICD-10-CM | POA: Diagnosis not present

## 2022-06-12 DIAGNOSIS — E119 Type 2 diabetes mellitus without complications: Secondary | ICD-10-CM | POA: Diagnosis not present

## 2022-06-13 LAB — LIPID PANEL
Chol/HDL Ratio: 3 ratio (ref 0.0–4.4)
Cholesterol, Total: 181 mg/dL (ref 100–199)
HDL: 61 mg/dL
LDL Chol Calc (NIH): 109 mg/dL — ABNORMAL HIGH (ref 0–99)
Triglycerides: 58 mg/dL (ref 0–149)
VLDL Cholesterol Cal: 11 mg/dL (ref 5–40)

## 2022-06-13 LAB — HEMOGLOBIN A1C
Est. average glucose Bld gHb Est-mCnc: 183 mg/dL
Hgb A1c MFr Bld: 8 % — ABNORMAL HIGH (ref 4.8–5.6)

## 2022-06-14 ENCOUNTER — Ambulatory Visit (INDEPENDENT_AMBULATORY_CARE_PROVIDER_SITE_OTHER): Payer: Medicare Other | Admitting: Internal Medicine

## 2022-06-14 ENCOUNTER — Encounter: Payer: Self-pay | Admitting: Internal Medicine

## 2022-06-14 VITALS — BP 122/70 | HR 63 | Ht 60.5 in | Wt 108.0 lb

## 2022-06-14 DIAGNOSIS — Z794 Long term (current) use of insulin: Secondary | ICD-10-CM | POA: Diagnosis not present

## 2022-06-14 DIAGNOSIS — E785 Hyperlipidemia, unspecified: Secondary | ICD-10-CM | POA: Diagnosis not present

## 2022-06-14 DIAGNOSIS — E119 Type 2 diabetes mellitus without complications: Secondary | ICD-10-CM | POA: Diagnosis not present

## 2022-06-14 DIAGNOSIS — E1169 Type 2 diabetes mellitus with other specified complication: Secondary | ICD-10-CM

## 2022-06-14 MED ORDER — ATORVASTATIN CALCIUM 40 MG PO TABS: 40.0000 mg | ORAL_TABLET | Freq: Every day | ORAL | 3 refills | Status: DC

## 2022-06-14 MED ORDER — TIRZEPATIDE 2.5 MG/0.5ML ~~LOC~~ SOAJ: 2.5000 mg | SUBCUTANEOUS | 0 refills | Status: DC

## 2022-06-14 NOTE — Patient Instructions (Signed)
It was a pleasure to see you today.  Thank you for giving [place] the opportunity to be involved in your care.  Below is a brief recap of your visit and next steps.  We will plan to see you again in May.  Summary Restart Mounjaro 2.5 mg weekly Increase Lipitor to 40 mg Follow up next month

## 2022-06-14 NOTE — Progress Notes (Signed)
Established Patient Office Visit  Subjective   Patient ID: Karen Horn, female    DOB: [birth year]  Age: 77 y.o. MRN: [id number]  Chief Complaint  Patient presents with   Diabetes   Karen Horn returns to care today for DM and HLD follow-up.  She was last evaluated by me on 3/15 at which time Karen Horn was increased to 5 mg weekly.  Ropinirole was also increased to 1 mg nightly for improved treatment of RLS.  In the interim she has been seen by orthopedic surgery.  We also spoke on 3/26 to discuss medication concerns.  She endorsed significant nausea with vomiting since starting Mounjaro.  She additionally endorsed symptoms of abdominal pain, diarrhea, and headache.  Ultimately metformin was restarted and Mounjaro was discontinued.  There have otherwise been no acute interval events.  Karen Horn reports feeling fairly well today.  She is currently taking [place] 18 units twice daily and no additional medication for diabetes, including metformin and glipizide.  She is interested in resuming Mounjaro as she believes the etiology of her symptoms was not solely attributable to [facility] but rather due to the combination of Mounjaro, metformin and glipizide.  She has no additional concerns to discuss today.  Past Medical History:  Diagnosis Date   Diabetes mellitus without complication (HCC)    Hypothyroidism    Thyroid disease    Past Surgical History:  Procedure Laterality Date   ABDOMINAL HYSTERECTOMY     ADENOIDECTOMY     BACK SURGERY     BIOPSY  09/06/2021   Procedure: BIOPSY;  Surgeon: Marguerita Merles, Reuel Boom, MD;  [facility];  Service: Gastroenterology;;   ESOPHAGOGASTRODUODENOSCOPY (EGD) WITH PROPOFOL N/A 09/06/2021   Procedure: ESOPHAGOGASTRODUODENOSCOPY (EGD) WITH PROPOFOL;  Surgeon: Dolores Frame, MD;  [facility];  Service: Gastroenterology;  Laterality: N/A;  215 ASA 2   KNEE ARTHROSCOPY WITH MEDIAL MENISECTOMY Left 02/27/2022   Procedure:  KNEE ARTHROSCOPY WITH MEDIAL MENISCECTOMY;  Surgeon: Vickki Hearing, MD;  Location: [facility];  Service: Orthopedics;  Laterality: Left;   SINOSCOPY     TONSILLECTOMY     Social History   Tobacco Use   Smoking status: Never   Smokeless tobacco: Never  Vaping Use   Vaping Use: Never used  Substance Use Topics   Alcohol use: Not Currently   Drug use: Never   History reviewed. No pertinent family history. Allergies  Allergen Reactions   Codeine Rash    Other reaction(s): Chest Pain   Gabapentin Other (See Comments)    Other reaction(s): Other Depression, suicidal thoughts   Sulfa Antibiotics Rash and Other (See Comments)    chest Pain   Atorvastatin Calcium Other (See Comments)    Other reaction(s): Other myalgia   Escitalopram Other (See Comments)    Other reaction(s): Other Headaches   Ezetimibe Other (See Comments)    Other reaction(s): Other Myalgia-patient wants to retry generic 10/17   Fluvastatin Other (See Comments)    Other reaction(s): Other myalgia   Fosamax [Alendronate]     Other reaction(s): Other Leg pain   Levofloxacin Other (See Comments)    Other reaction(s): Other Muscle aching in her shoulders, inner thighs and legs   Pramipexole Nausea Only and Other (See Comments)    Headache    Prednisone Other (See Comments)    Unknown reaction type   Rosuvastatin Calcium Other (See Comments)    Other reaction(s): Other myalgia   Sertraline Other (See Comments)    Other reaction(s):  Other Headaches   Simvastatin Other (See Comments)    Other reaction(s): Other myalgia   Sulfur Rash   Trazodone And Nefazodone Nausea Only   Review of Systems  Constitutional:  Negative for chills and fever.  HENT:  Negative for sore throat.   Respiratory:  Negative for cough and shortness of breath.   Cardiovascular:  Negative for chest pain, palpitations and leg swelling.  Gastrointestinal:  Negative for abdominal pain, blood in stool, constipation, diarrhea, nausea  and vomiting.  Genitourinary:  Negative for dysuria and hematuria.  Musculoskeletal:  Negative for myalgias.  Skin:  Negative for itching and rash.  Neurological:  Negative for dizziness and headaches.  Psychiatric/Behavioral:  Negative for depression and suicidal ideas.      Objective:     BP 122/70   Pulse 63   Ht 5' 0.5" (1.537 m)   Wt 108 lb (49 kg)   SpO2 92%   BMI 20.75 kg/m  BP Readings from Last 3 Encounters:  06/14/22 122/70  05/04/22 138/70  05/01/22 132/61   Physical Exam Vitals reviewed.  Constitutional:      General: She is not in acute distress.    Appearance: Normal appearance. She is not toxic-appearing.  HENT:     Head: Normocephalic and atraumatic.     Right Ear: External ear normal.     Left Ear: External ear normal.     Nose: Nose normal. No congestion or rhinorrhea.     Mouth/Throat:     Mouth: Mucous membranes are moist.     Pharynx: Oropharynx is clear. No oropharyngeal exudate or posterior oropharyngeal erythema.  Eyes:     General: No scleral icterus.    Extraocular Movements: Extraocular movements intact.     Conjunctiva/sclera: Conjunctivae normal.     Pupils: Pupils are equal, round, and reactive to light.  Cardiovascular:     Rate and Rhythm: Normal rate and regular rhythm.     Pulses: Normal pulses.     Heart sounds: Normal heart sounds. No murmur heard.    No friction rub. No gallop.  Pulmonary:     Effort: Pulmonary effort is normal.     Breath sounds: Normal breath sounds. No wheezing, rhonchi or rales.  Abdominal:     General: Abdomen is flat. Bowel sounds are normal. There is no distension.     Palpations: Abdomen is soft.     Tenderness: There is no abdominal tenderness.  Musculoskeletal:        General: No swelling. Normal range of motion.     Cervical back: Normal range of motion.     Right lower leg: No edema.     Left lower leg: No edema.  Lymphadenopathy:     Cervical: No cervical adenopathy.  Skin:    General: Skin  is warm and dry.     Capillary Refill: Capillary refill takes less than 2 seconds.     Coloration: Skin is not jaundiced.  Neurological:     General: No focal deficit present.     Mental Status: She is alert and oriented to person, place, and time.  Psychiatric:        Mood and Affect: Mood normal.        Behavior: Behavior normal.   Last CBC Lab Results  Component Value Date   WBC 6.4 02/23/2022   HGB 13.6 02/23/2022   HCT 42.4 02/23/2022   MCV 90.6 02/23/2022   MCH 29.1 02/23/2022   RDW 13.6 02/23/2022   PLT 228  02/23/2022   Last metabolic panel Lab Results  Component Value Date   GLUCOSE 218 (H) 02/23/2022   NA 134 (L) 02/23/2022   K 4.0 02/23/2022   CL 104 02/23/2022   CO2 21 (L) 02/23/2022   BUN 14 02/23/2022   CREATININE 0.70 02/23/2022   GFRNONAA >60 02/23/2022   CALCIUM 9.3 02/23/2022   PROT 6.7 01/15/2022   ALBUMIN 4.3 01/15/2022   LABGLOB 2.4 01/15/2022   AGRATIO 1.8 01/15/2022   BILITOT 0.3 01/15/2022   ALKPHOS 47 01/15/2022   AST 17 01/15/2022   ALT 18 01/15/2022   ANIONGAP 9 02/23/2022   Last lipids Lab Results  Component Value Date   CHOL 181 06/12/2022   HDL 61 06/12/2022   LDLCALC 109 (H) 06/12/2022   TRIG 58 06/12/2022   CHOLHDL 3.0 06/12/2022   Last hemoglobin A1c Lab Results  Component Value Date   HGBA1C 8.0 (H) 06/12/2022   Last thyroid functions Lab Results  Component Value Date   TSH 1.590 01/15/2022   Last vitamin D Lab Results  Component Value Date   VD25OH 31.5 01/15/2022   Last vitamin B12 and Folate Lab Results  Component Value Date   VITAMINB12 397 01/15/2022   FOLATE 10.6 01/15/2022   The 10-year ASCVD risk score (Arnett DK, et al., 2019) is: 26.4%    Assessment & Plan:   Problem List Items Addressed This Visit       Type 2 diabetes mellitus (HCC)    Presenting today for DM follow-up.  A1c 8.0 earlier this week, which is improved from 8.5 previously.  She is currently taking [place] 18 units twice daily  and is interested in resuming Mounjaro.  She believes her side effects were not solely attributable to [facility] and she would ultimately like to titrate off of insulin. -Through shared decision making, Mounjaro 2.5 mg weekly has been resumed. -We will follow-up next month as previously scheduled for reassessment      Hyperlipidemia associated with type 2 diabetes mellitus (HCC) - Primary    Lipid panel updated earlier this week.  Total cholesterol 181 and LDL 109.  Her 10-year ASCVD risk today is 26.4%.  She is currently prescribed atorvastatin 20 mg daily.  She has previously experienced leg cramping with high intensity statin therapy but would like to try atorvastatin 40 mg to see if this causes her symptoms. -Increase atorvastatin to 40 mg daily -Follow-up next month for reassessment       Return if symptoms worsen or fail to improve.    Billie Lade, MD

## 2022-06-18 ENCOUNTER — Other Ambulatory Visit: Payer: Self-pay | Admitting: Internal Medicine

## 2022-06-18 DIAGNOSIS — F419 Anxiety disorder, unspecified: Secondary | ICD-10-CM

## 2022-06-20 NOTE — Assessment & Plan Note (Signed)
Presenting today for DM follow-up.  A1c 8.0 earlier this week, which is improved from 8.5 previously.  She is currently taking [place] 18 units twice daily and is interested in resuming Mounjaro.  She believes her side effects were not solely attributable to [facility] and she would ultimately like to titrate off of insulin. -Through shared decision making, Mounjaro 2.5 mg weekly has been resumed. -We will follow-up next month as previously scheduled for reassessment

## 2022-06-20 NOTE — Assessment & Plan Note (Signed)
Lipid panel updated earlier this week.  Total cholesterol 181 and LDL 109.  Her 10-year ASCVD risk today is 26.4%.  She is currently prescribed atorvastatin 20 mg daily.  She has previously experienced leg cramping with high intensity statin therapy but would like to try atorvastatin 40 mg to see if this causes her symptoms. -Increase atorvastatin to 40 mg daily -Follow-up next month for reassessment

## 2022-06-29 ENCOUNTER — Encounter: Payer: Self-pay | Admitting: Internal Medicine

## 2022-07-05 ENCOUNTER — Encounter: Payer: Self-pay | Admitting: Orthopedic Surgery

## 2022-07-05 ENCOUNTER — Ambulatory Visit: Payer: Medicare Other | Admitting: Orthopedic Surgery

## 2022-07-05 DIAGNOSIS — S83242D Other tear of medial meniscus, current injury, left knee, subsequent encounter: Secondary | ICD-10-CM | POA: Diagnosis not present

## 2022-07-05 DIAGNOSIS — M7052 Other bursitis of knee, left knee: Secondary | ICD-10-CM

## 2022-07-05 DIAGNOSIS — Z9889 Other specified postprocedural states: Secondary | ICD-10-CM

## 2022-07-05 NOTE — Progress Notes (Signed)
Follow-up status post arthroscopy left knee on February 27, 2022 patient developed some pes bursitis pain and complete pain in the patella free up femoral region with crepitance  She received 2 injections 1 for the bursitis 1 for the patellofemoral disease  Chief Complaint  Patient presents with   Post-op Follow-up    Left knee 02/27/22 improving / moving well today states doing well    Karen Horn is doing well just a little residual medial tenderness  The injections helped her significantly she only has discomfort when it is going to rain  She exhibited no effusion today normal gait, she had stable knee with good motion and no restrictions  Encounter Diagnoses  Name Primary?   S/P arthroscopy of left knee (02/27/22) Yes   Acute medial meniscus tear of left knee, subsequent encounter    Pes anserinus bursitis of left knee     Follow-up as needed

## 2022-07-13 ENCOUNTER — Ambulatory Visit (INDEPENDENT_AMBULATORY_CARE_PROVIDER_SITE_OTHER): Payer: Medicare Other | Admitting: Internal Medicine

## 2022-07-13 ENCOUNTER — Encounter: Payer: Self-pay | Admitting: Internal Medicine

## 2022-07-13 VITALS — BP 117/68 | HR 68 | Ht 60.5 in | Wt 107.6 lb

## 2022-07-13 DIAGNOSIS — E1169 Type 2 diabetes mellitus with other specified complication: Secondary | ICD-10-CM | POA: Diagnosis not present

## 2022-07-13 DIAGNOSIS — Z794 Long term (current) use of insulin: Secondary | ICD-10-CM

## 2022-07-13 DIAGNOSIS — F419 Anxiety disorder, unspecified: Secondary | ICD-10-CM | POA: Diagnosis not present

## 2022-07-13 DIAGNOSIS — F32A Depression, unspecified: Secondary | ICD-10-CM | POA: Diagnosis not present

## 2022-07-13 DIAGNOSIS — E119 Type 2 diabetes mellitus without complications: Secondary | ICD-10-CM | POA: Diagnosis not present

## 2022-07-13 DIAGNOSIS — E785 Hyperlipidemia, unspecified: Secondary | ICD-10-CM | POA: Diagnosis not present

## 2022-07-13 MED ORDER — TIRZEPATIDE 2.5 MG/0.5ML ~~LOC~~ SOAJ: 2.5000 mg | SUBCUTANEOUS | 0 refills | Status: DC

## 2022-07-13 MED ORDER — SERTRALINE HCL 50 MG PO TABS: 50.0000 mg | ORAL_TABLET | Freq: Every day | ORAL | 3 refills | Status: DC

## 2022-07-13 MED ORDER — TRESIBA FLEXTOUCH 200 UNIT/ML ~~LOC~~ SOPN: 10.0000 [IU] | PEN_INJECTOR | Freq: Every morning | SUBCUTANEOUS | 0 refills | Status: DC

## 2022-07-13 NOTE — Progress Notes (Unsigned)
Established Patient Office Visit  Subjective   Patient ID: Karen Horn, female    DOB: 06-11-1945  Age: 77 y.o. MRN: [id number]  Chief Complaint  Patient presents with   Diabetes    Follow up   Karen Horn returns to care today for routine follow-up.  Last evaluated by me on 4/25 for DM and HLD follow-up.  Mounjaro 2.5 mg weekly was resumed and atorvastatin was increased to 40 mg daily.  In the interim she has been seen by orthopedic surgery.  There have otherwise been no acute interval events.  Today Karen Horn reports feeling fairly well.  She endorses recent family stress that has worsened her anxiety.  She is interested in increasing sertraline today.  Denies SI/HI.  She has no additional concerns to discuss.  Past Medical History:  Diagnosis Date   Diabetes mellitus without complication (HCC)    Hypothyroidism    Thyroid disease    Past Surgical History:  Procedure Laterality Date   ABDOMINAL HYSTERECTOMY     ADENOIDECTOMY     BACK SURGERY     BIOPSY  09/06/2021   Procedure: BIOPSY;  Surgeon: Marguerita Merles, Reuel Boom, MD;  [facility];  Service: Gastroenterology;;   ESOPHAGOGASTRODUODENOSCOPY (EGD) WITH PROPOFOL N/A 09/06/2021   Procedure: ESOPHAGOGASTRODUODENOSCOPY (EGD) WITH PROPOFOL;  Surgeon: Dolores Frame, MD;  [facility];  Service: Gastroenterology;  Laterality: N/A;  215 ASA 2   KNEE ARTHROSCOPY WITH MEDIAL MENISECTOMY Left 02/27/2022   Procedure: KNEE ARTHROSCOPY WITH MEDIAL MENISCECTOMY;  Surgeon: Vickki Hearing, MD;  Location: [facility];  Service: Orthopedics;  Laterality: Left;   SINOSCOPY     TONSILLECTOMY     Social History   Tobacco Use   Smoking status: Never   Smokeless tobacco: Never  Vaping Use   Vaping Use: Never used  Substance Use Topics   Alcohol use: Not Currently   Drug use: Never   History reviewed. No pertinent family history. Allergies  Allergen Reactions   Codeine Rash    Other reaction(s):  Chest Pain   Gabapentin Other (See Comments)    Other reaction(s): Other Depression, suicidal thoughts   Sulfa Antibiotics Rash and Other (See Comments)    chest Pain   Atorvastatin Calcium Other (See Comments)    Other reaction(s): Other myalgia   Escitalopram Other (See Comments)    Other reaction(s): Other Headaches   Ezetimibe Other (See Comments)    Other reaction(s): Other Myalgia-patient wants to retry generic 10/17   Fluvastatin Other (See Comments)    Other reaction(s): Other myalgia   Fosamax [Alendronate]     Other reaction(s): Other Leg pain   Levofloxacin Other (See Comments)    Other reaction(s): Other Muscle aching in her shoulders, inner thighs and legs   Pramipexole Nausea Only and Other (See Comments)    Headache    Prednisone Other (See Comments)    Unknown reaction type   Rosuvastatin Calcium Other (See Comments)    Other reaction(s): Other myalgia   Sertraline Other (See Comments)    Other reaction(s): Other Headaches   Simvastatin Other (See Comments)    Other reaction(s): Other myalgia   Sulfur Rash   Trazodone And Nefazodone Nausea Only   Review of Systems  Constitutional:  Negative for chills and fever.  HENT:  Negative for sore throat.   Respiratory:  Negative for cough and shortness of breath.   Cardiovascular:  Negative for chest pain, palpitations and leg swelling.  Gastrointestinal:  Negative for  abdominal pain, blood in stool, constipation, diarrhea, nausea and vomiting.  Genitourinary:  Negative for dysuria and hematuria.  Musculoskeletal:  Negative for myalgias.  Skin:  Negative for itching and rash.  Neurological:  Negative for dizziness and headaches.  Psychiatric/Behavioral:  Positive for depression. Negative for suicidal ideas. The patient is nervous/anxious.      Objective:     BP 117/68   Pulse 68   Ht 5' 0.5" (1.537 m)   Wt 107 lb 9.6 oz (48.8 kg)   SpO2 97%   BMI 20.67 kg/m  BP Readings from Last 3 Encounters:   07/13/22 117/68  06/14/22 122/70  05/04/22 138/70   Physical Exam Vitals reviewed.  Constitutional:      General: She is not in acute distress.    Appearance: Normal appearance. She is not toxic-appearing.  HENT:     Head: Normocephalic and atraumatic.     Right Ear: External ear normal.     Left Ear: External ear normal.     Nose: Nose normal. No congestion or rhinorrhea.     Mouth/Throat:     Mouth: Mucous membranes are moist.     Pharynx: Oropharynx is clear. No oropharyngeal exudate or posterior oropharyngeal erythema.  Eyes:     General: No scleral icterus.    Extraocular Movements: Extraocular movements intact.     Conjunctiva/sclera: Conjunctivae normal.     Pupils: Pupils are equal, round, and reactive to light.  Cardiovascular:     Rate and Rhythm: Normal rate and regular rhythm.     Pulses: Normal pulses.     Heart sounds: Normal heart sounds. No murmur heard.    No friction rub. No gallop.  Pulmonary:     Effort: Pulmonary effort is normal.     Breath sounds: Normal breath sounds. No wheezing, rhonchi or rales.  Abdominal:     General: Abdomen is flat. Bowel sounds are normal. There is no distension.     Palpations: Abdomen is soft.     Tenderness: There is no abdominal tenderness.  Musculoskeletal:        General: No swelling. Normal range of motion.     Cervical back: Normal range of motion.     Right lower leg: No edema.     Left lower leg: No edema.  Lymphadenopathy:     Cervical: No cervical adenopathy.  Skin:    General: Skin is warm and dry.     Capillary Refill: Capillary refill takes less than 2 seconds.     Coloration: Skin is not jaundiced.  Neurological:     General: No focal deficit present.     Mental Status: She is alert and oriented to person, place, and time.  Psychiatric:        Mood and Affect: Mood normal.        Behavior: Behavior normal.   Last CBC Lab Results  Component Value Date   WBC 6.4 02/23/2022   HGB 13.6 02/23/2022    HCT 42.4 02/23/2022   MCV 90.6 02/23/2022   MCH 29.1 02/23/2022   RDW 13.6 02/23/2022   PLT 228 02/23/2022   Last metabolic panel Lab Results  Component Value Date   GLUCOSE 218 (H) 02/23/2022   NA 134 (L) 02/23/2022   K 4.0 02/23/2022   CL 104 02/23/2022   CO2 21 (L) 02/23/2022   BUN 14 02/23/2022   CREATININE 0.70 02/23/2022   GFRNONAA >60 02/23/2022   CALCIUM 9.3 02/23/2022   PROT 6.7 01/15/2022  ALBUMIN 4.3 01/15/2022   LABGLOB 2.4 01/15/2022   AGRATIO 1.8 01/15/2022   BILITOT 0.3 01/15/2022   ALKPHOS 47 01/15/2022   AST 17 01/15/2022   ALT 18 01/15/2022   ANIONGAP 9 02/23/2022   Last lipids Lab Results  Component Value Date   CHOL 181 06/12/2022   HDL 61 06/12/2022   LDLCALC 109 (H) 06/12/2022   TRIG 58 06/12/2022   CHOLHDL 3.0 06/12/2022   Last hemoglobin A1c Lab Results  Component Value Date   HGBA1C 8.0 (H) 06/12/2022   Last thyroid functions Lab Results  Component Value Date   TSH 1.590 01/15/2022   Last vitamin D Lab Results  Component Value Date   VD25OH 31.5 01/15/2022   Last vitamin B12 and Folate Lab Results  Component Value Date   VITAMINB12 397 01/15/2022   FOLATE 10.6 01/15/2022   The 10-year ASCVD risk score (Arnett DK, et al., 2019) is: 24.6%    Assessment & Plan:   Problem List Items Addressed This Visit       Type 2 diabetes mellitus (HCC)    Returning to care today for DM follow-up.  Mounjaro 2.5 mg weekly was resumed at her appointment last month.  She is additionally prescribed Tresiba 10 units twice daily.  She reports low AM blood sugar readings readings, decreasing to 70s.  She has not experienced any adverse side effects with resuming Mounjaro. -Discontinue evening Tresiba due to low AM blood sugar readings -We will gradually increase Mounjaro to 5 mg weekly a very prolonged.  Given previous adverse side effects. -Repeat A1c in July      Hyperlipidemia associated with type 2 diabetes mellitus (HCC)    Atorvastatin  recently increased to 40 mg daily for improved treatment of HLD in the setting of diabetes mellitus with a significantly elevated 10-year ASCVD risk.  She has not experienced any adverse side effects with this adjustment. -Repeat lipid panel at follow-up in July      Anxiety and depression - Primary    She is currently prescribed sertraline 25 mg nightly as well as Xanax 0.25 mg as daily as needed for anxiety relief.  Mood has previously been stable, however she endorses recent family stress that has worsened her anxiety and depression.  She is interested in increasing sertraline today.  PHQ-9 score 9, GAD-7 score 8.  Denies SI/HI. -Increase sertraline to 50 mg weekly -We will tentatively plan for follow-up in July, however she was instructed return to care for symptoms worsen or fail to improve.      Return in about 2 months (around 09/12/2022).   Billie Lade, MD

## 2022-07-13 NOTE — Patient Instructions (Signed)
It was a pleasure to see you today.  Thank you for giving [place] the opportunity to be involved in your care.  Below is a brief recap of your visit and next steps.  We will plan to see you again in 2 months.  Summary Discontinue evening Evaristo Bury, continue taking each morning Continue Mounjaro at 2.5 mg weekly Increase sertraline to 50 mg daily Follow up in 2 months

## 2022-07-19 NOTE — Assessment & Plan Note (Signed)
Returning to care today for DM follow-up.  Mounjaro 2.5 mg weekly was resumed at her appointment last month.  She is additionally prescribed Tresiba 10 units twice daily.  She reports low AM blood sugar readings readings, decreasing to 70s.  She has not experienced any adverse side effects with resuming Mounjaro. -Discontinue evening Tresiba due to low AM blood sugar readings -We will gradually increase Mounjaro to 5 mg weekly a very prolonged.  Given previous adverse side effects. -Repeat A1c in July

## 2022-07-19 NOTE — Assessment & Plan Note (Signed)
She is currently prescribed sertraline 25 mg nightly as well as Xanax 0.25 mg as daily as needed for anxiety relief.  Mood has previously been stable, however she endorses recent family stress that has worsened her anxiety and depression.  She is interested in increasing sertraline today.  PHQ-9 score 9, GAD-7 score 8.  Denies SI/HI. -Increase sertraline to 50 mg weekly -We will tentatively plan for follow-up in July, however she was instructed return to care for symptoms worsen or fail to improve.

## 2022-07-19 NOTE — Assessment & Plan Note (Signed)
Atorvastatin recently increased to 40 mg daily for improved treatment of HLD in the setting of diabetes mellitus with a significantly elevated 10-year ASCVD risk.  She has not experienced any adverse side effects with this adjustment. -Repeat lipid panel at follow-up in July

## 2022-08-15 ENCOUNTER — Encounter: Payer: Self-pay | Admitting: Internal Medicine

## 2022-08-16 ENCOUNTER — Other Ambulatory Visit: Payer: Self-pay | Admitting: Internal Medicine

## 2022-08-16 ENCOUNTER — Other Ambulatory Visit: Payer: Self-pay

## 2022-08-16 DIAGNOSIS — E119 Type 2 diabetes mellitus without complications: Secondary | ICD-10-CM

## 2022-08-16 MED ORDER — TIRZEPATIDE 2.5 MG/0.5ML ~~LOC~~ SOAJ: 2.5000 mg | SUBCUTANEOUS | 0 refills | Status: DC

## 2022-08-17 ENCOUNTER — Telehealth: Payer: Self-pay | Admitting: Internal Medicine

## 2022-08-17 NOTE — Telephone Encounter (Signed)
Patient called back regarding refills; stated both were denied. Requesting call back with explanation.  Please advise at [phone].

## 2022-08-17 NOTE — Telephone Encounter (Signed)
Pt aware refills were sent and the duplicates were denied but they are at her pharamcy

## 2022-08-17 NOTE — Telephone Encounter (Signed)
Patient called to follow up on refill requests; stated both were denied. Requesting call back. Stated she needs the meds before the end of business today.  Please advise at [phone].

## 2022-08-20 ENCOUNTER — Telehealth: Payer: Self-pay | Admitting: Internal Medicine

## 2022-08-20 ENCOUNTER — Other Ambulatory Visit: Payer: Self-pay

## 2022-08-20 DIAGNOSIS — E119 Type 2 diabetes mellitus without complications: Secondary | ICD-10-CM

## 2022-08-20 DIAGNOSIS — E039 Hypothyroidism, unspecified: Secondary | ICD-10-CM

## 2022-08-20 NOTE — Telephone Encounter (Signed)
Patient called needs referral to Endo concern thyroid and sugar levels. Referral to [facility] internal medicine Ocie Cornfield  [phone] Call patient if this can be setup

## 2022-08-20 NOTE — Telephone Encounter (Signed)
Referral placed.

## 2022-08-21 NOTE — Telephone Encounter (Signed)
Patient aware.

## 2022-08-28 ENCOUNTER — Telehealth: Payer: Self-pay | Admitting: Internal Medicine

## 2022-08-28 NOTE — Telephone Encounter (Signed)
Pt called in wants to change Endo referral to [facility].  Patient wants a  cll back in regard.

## 2022-08-29 ENCOUNTER — Encounter: Payer: Self-pay | Admitting: Internal Medicine

## 2022-09-05 ENCOUNTER — Other Ambulatory Visit: Payer: Self-pay

## 2022-09-10 DIAGNOSIS — Z794 Long term (current) use of insulin: Secondary | ICD-10-CM | POA: Diagnosis not present

## 2022-09-10 DIAGNOSIS — E1169 Type 2 diabetes mellitus with other specified complication: Secondary | ICD-10-CM | POA: Diagnosis not present

## 2022-09-10 DIAGNOSIS — E785 Hyperlipidemia, unspecified: Secondary | ICD-10-CM | POA: Diagnosis not present

## 2022-09-10 DIAGNOSIS — E119 Type 2 diabetes mellitus without complications: Secondary | ICD-10-CM | POA: Diagnosis not present

## 2022-09-11 LAB — LIPID PANEL
Chol/HDL Ratio: 2.9 ratio (ref 0.0–4.4)
Cholesterol, Total: 159 mg/dL (ref 100–199)
HDL: 55 mg/dL (ref >39)
LDL Chol Calc (NIH): 90 mg/dL (ref 0–99)
Triglycerides: 75 mg/dL (ref 0–149)
VLDL Cholesterol Cal: 14 mg/dL (ref 5–40)

## 2022-09-11 LAB — HEMOGLOBIN A1C
Est. average glucose Bld gHb Est-mCnc: 163 mg/dL
Hgb A1c MFr Bld: 7.3 % — ABNORMAL HIGH (ref 4.8–5.6)

## 2022-09-13 ENCOUNTER — Encounter: Payer: Self-pay | Admitting: Internal Medicine

## 2022-09-13 ENCOUNTER — Ambulatory Visit (INDEPENDENT_AMBULATORY_CARE_PROVIDER_SITE_OTHER): Payer: Medicare Other | Admitting: Internal Medicine

## 2022-09-13 VITALS — BP 129/73 | HR 78 | Ht 60.5 in | Wt 104.4 lb

## 2022-09-13 DIAGNOSIS — F419 Anxiety disorder, unspecified: Secondary | ICD-10-CM | POA: Diagnosis not present

## 2022-09-13 DIAGNOSIS — Z794 Long term (current) use of insulin: Secondary | ICD-10-CM

## 2022-09-13 DIAGNOSIS — N3941 Urge incontinence: Secondary | ICD-10-CM

## 2022-09-13 DIAGNOSIS — E785 Hyperlipidemia, unspecified: Secondary | ICD-10-CM | POA: Diagnosis not present

## 2022-09-13 DIAGNOSIS — F32A Depression, unspecified: Secondary | ICD-10-CM | POA: Diagnosis not present

## 2022-09-13 DIAGNOSIS — E1169 Type 2 diabetes mellitus with other specified complication: Secondary | ICD-10-CM

## 2022-09-13 DIAGNOSIS — E119 Type 2 diabetes mellitus without complications: Secondary | ICD-10-CM

## 2022-09-13 DIAGNOSIS — N3946 Mixed incontinence: Secondary | ICD-10-CM | POA: Insufficient documentation

## 2022-09-13 MED ORDER — TIRZEPATIDE 2.5 MG/0.5ML ~~LOC~~ SOAJ: 2.5000 mg | SUBCUTANEOUS | 2 refills | Status: DC

## 2022-09-13 MED ORDER — DEXCOM G7 RECEIVER DEVI: 1.0000 | 0 refills | Status: AC

## 2022-09-13 MED ORDER — SERTRALINE HCL 25 MG PO TABS: 25.0000 mg | ORAL_TABLET | Freq: Every day | ORAL | 3 refills | Status: DC

## 2022-09-13 MED ORDER — DEXCOM G7 SENSOR MISC: 1.0000 | 3 refills | Status: DC

## 2022-09-13 NOTE — Assessment & Plan Note (Signed)
A1c improved to 7.3 from 8.0 previously.  She is currently prescribed Mounjaro 2.5 mg weekly and Tresiba 10 units every morning.  Today she continues to endorse nausea that may last up to 3 days after her Mounjaro injection.  She has not tried taking previously prescribed Zofran for as needed nausea relief.  Since her last appointment, she requested a referral to endocrinology and reports today that an appointment is scheduled for late October. -We had a long discussion today regarding the benefits vs side effects related to [facility].  Treatment options were reviewed and Karen Horn is pleased with the improvement in her A1c.  For now, she would like to continue Mounjaro at 2.5 mg weekly and try as needed use of Zofran for nausea relief. -Dexcom ordered today -Endocrinology appointment is scheduled for late October

## 2022-09-13 NOTE — Assessment & Plan Note (Signed)
She requested increase sertraline to 50 mg daily at her last appointment due to worsening symptoms of anxiety and depression.  She is additionally prescribed Xanax 0.25 mg daily as needed for anxiety relief today she states that her mood and anxiety have improved.  She is concerned that sertraline at 50 mg daily may be contributing to symptoms of nausea and she would like to reduce the dose back to 25 mg daily. -Reduce sertraline to 25 mg daily.  Continue Xanax as currently prescribed.

## 2022-09-13 NOTE — Progress Notes (Signed)
Established Patient Office Visit  Subjective   Patient ID: Karen Horn, female    DOB: 1945/08/17  Age: 77 y.o. MRN: [id number]  Chief Complaint  Patient presents with   Diabetes    Follow up; wants a cgm. Still staying sick after taking mounjaro   Urinary Incontinence    Not able to hold it. Does not want medication.   Karen Horn returns to care today for follow-up.  She was last evaluated by me on 5/24.  At that time her evening dose of Evaristo Bury was decreased due to overnight hypoglycemia.  Sertraline was also increased to 50 mg daily at her request due to worsening anxiety and depression.  In the interim she requested a referral to endocrinology and reports today that she has an appointment scheduled for late October.  There have otherwise been no acute interval events.  Karen Horn reports feeling fairly well today but endorses persistent symptoms of nausea for 2-3 days after her Mounjaro injection.  This is beginning to impair her quality of life.  She is additionally interested in receiving a CGM as she does not like checking her blood sugar every day.  Her additional concerns are urinary incontinence.  She states that she is not able to make it to the toilet after feeling an urge to void.  She requests a referral to urology for further evaluation.  Lastly, she states that her anxiety and depression have improved over the last 2 months.  She is concerned that sertraline may be contributing to nausea and she would like to reduce the dose back to 25 mg daily.   Past Medical History:  Diagnosis Date   Diabetes mellitus without complication (HCC)    Hypothyroidism    Thyroid disease    Past Surgical History:  Procedure Laterality Date   ABDOMINAL HYSTERECTOMY     ADENOIDECTOMY     BACK SURGERY     BIOPSY  09/06/2021   Procedure: BIOPSY;  Surgeon: Marguerita Merles, Reuel Boom, MD;  Location: [facility];  Service: Gastroenterology;;   ESOPHAGOGASTRODUODENOSCOPY (EGD) WITH PROPOFOL  N/A 09/06/2021   Procedure: ESOPHAGOGASTRODUODENOSCOPY (EGD) WITH PROPOFOL;  Surgeon: Dolores Frame, MD;  Location: [facility];  Service: Gastroenterology;  Laterality: N/A;  215 ASA 2   KNEE ARTHROSCOPY WITH MEDIAL MENISECTOMY Left 02/27/2022   Procedure: KNEE ARTHROSCOPY WITH MEDIAL MENISCECTOMY;  Surgeon: Vickki Hearing, MD;  Location: [facility];  Service: Orthopedics;  Laterality: Left;   SINOSCOPY     TONSILLECTOMY     Social History   Tobacco Use   Smoking status: Never   Smokeless tobacco: Never  Vaping Use   Vaping status: Never Used  Substance Use Topics   Alcohol use: Not Currently   Drug use: Never   History reviewed. No pertinent family history. Allergies  Allergen Reactions   Codeine Rash    Other reaction(s): Chest Pain   Gabapentin Other (See Comments)    Other reaction(s): Other Depression, suicidal thoughts   Sulfa Antibiotics Rash and Other (See Comments)    chest Pain   Atorvastatin Calcium Other (See Comments)    Other reaction(s): Other myalgia   Escitalopram Other (See Comments)    Other reaction(s): Other Headaches   Ezetimibe Other (See Comments)    Other reaction(s): Other Myalgia-patient wants to retry generic 10/17   Fluvastatin Other (See Comments)    Other reaction(s): Other myalgia   Fosamax [Alendronate]     Other reaction(s): Other Leg pain   Levofloxacin Other (See  Comments)    Other reaction(s): Other Muscle aching in her shoulders, inner thighs and legs   Pramipexole Nausea Only and Other (See Comments)    Headache    Prednisone Other (See Comments)    Unknown reaction type   Rosuvastatin Calcium Other (See Comments)    Other reaction(s): Other myalgia   Sertraline Other (See Comments)    Other reaction(s): Other Headaches   Simvastatin Other (See Comments)    Other reaction(s): Other myalgia   Sulfur Rash   Trazodone And Nefazodone Nausea Only   Review of Systems  Gastrointestinal:  Positive for nausea  and vomiting.  Genitourinary:        Urge incontinence  All other systems reviewed and are negative.    Objective:     BP 129/73   Pulse 78   Ht 5' 0.5" (1.537 m)   Wt 104 lb 6.4 oz (47.4 kg)   SpO2 96%   BMI 20.05 kg/m  BP Readings from Last 3 Encounters:  09/13/22 129/73  07/13/22 117/68  06/14/22 122/70   Physical Exam Vitals reviewed.  Constitutional:      General: She is not in acute distress.    Appearance: Normal appearance. She is not toxic-appearing.  HENT:     Head: Normocephalic and atraumatic.     Right Ear: External ear normal.     Left Ear: External ear normal.     Nose: Nose normal. No congestion or rhinorrhea.     Mouth/Throat:     Mouth: Mucous membranes are moist.     Pharynx: Oropharynx is clear. No oropharyngeal exudate or posterior oropharyngeal erythema.  Eyes:     General: No scleral icterus.    Extraocular Movements: Extraocular movements intact.     Conjunctiva/sclera: Conjunctivae normal.     Pupils: Pupils are equal, round, and reactive to light.  Cardiovascular:     Rate and Rhythm: Normal rate and regular rhythm.     Pulses: Normal pulses.     Heart sounds: Normal heart sounds. No murmur heard.    No friction rub. No gallop.  Pulmonary:     Effort: Pulmonary effort is normal.     Breath sounds: Normal breath sounds. No wheezing, rhonchi or rales.  Abdominal:     General: Abdomen is flat. Bowel sounds are normal. There is no distension.     Palpations: Abdomen is soft.     Tenderness: There is no abdominal tenderness.  Musculoskeletal:        General: No swelling. Normal range of motion.     Cervical back: Normal range of motion.     Right lower leg: No edema.     Left lower leg: No edema.  Lymphadenopathy:     Cervical: No cervical adenopathy.  Skin:    General: Skin is warm and dry.     Capillary Refill: Capillary refill takes less than 2 seconds.     Coloration: Skin is not jaundiced.  Neurological:     General: No focal  deficit present.     Mental Status: She is alert and oriented to person, place, and time.  Psychiatric:        Mood and Affect: Mood normal.        Behavior: Behavior normal.   Last CBC Lab Results  Component Value Date   WBC 6.4 02/23/2022   HGB 13.6 02/23/2022   HCT 42.4 02/23/2022   MCV 90.6 02/23/2022   MCH 29.1 02/23/2022   RDW 13.6 02/23/2022  PLT 228 02/23/2022   Last metabolic panel Lab Results  Component Value Date   GLUCOSE 218 (H) 02/23/2022   NA 134 (L) 02/23/2022   K 4.0 02/23/2022   CL 104 02/23/2022   CO2 21 (L) 02/23/2022   BUN 14 02/23/2022   CREATININE 0.70 02/23/2022   GFRNONAA >60 02/23/2022   CALCIUM 9.3 02/23/2022   PROT 6.7 01/15/2022   ALBUMIN 4.3 01/15/2022   LABGLOB 2.4 01/15/2022   AGRATIO 1.8 01/15/2022   BILITOT 0.3 01/15/2022   ALKPHOS 47 01/15/2022   AST 17 01/15/2022   ALT 18 01/15/2022   ANIONGAP 9 02/23/2022   Last lipids Lab Results  Component Value Date   CHOL 159 09/10/2022   HDL 55 09/10/2022   LDLCALC 90 09/10/2022   TRIG 75 09/10/2022   CHOLHDL 2.9 09/10/2022   Last hemoglobin A1c Lab Results  Component Value Date   HGBA1C 7.3 (H) 09/10/2022   Last thyroid functions Lab Results  Component Value Date   TSH 1.590 01/15/2022   Last vitamin D Lab Results  Component Value Date   VD25OH 31.5 01/15/2022   Last vitamin B12 and Folate Lab Results  Component Value Date   VITAMINB12 397 01/15/2022   FOLATE 10.6 01/15/2022   The 10-year ASCVD risk score (Arnett DK, et al., 2019) is: 28.6%    Assessment & Plan:   Problem List Items Addressed This Visit       Type 2 diabetes mellitus (HCC) - Primary    A1c improved to 7.3 from 8.0 previously.  She is currently prescribed Mounjaro 2.5 mg weekly and Tresiba 10 units every morning.  Today she continues to endorse nausea that may last up to 3 days after her Mounjaro injection.  She has not tried taking previously prescribed Zofran for as needed nausea relief.  Since  her last appointment, she requested a referral to endocrinology and reports today that an appointment is scheduled for late October. -We had a long discussion today regarding the benefits vs side effects related to [facility].  Treatment options were reviewed and Ms. Ferrara is pleased with the improvement in her A1c.  For now, she would like to continue Mounjaro at 2.5 mg weekly and try as needed use of Zofran for nausea relief. -Dexcom ordered today -Endocrinology appointment is scheduled for late October      Hyperlipidemia associated with type 2 diabetes mellitus (HCC)    Lipid panel updated earlier this month.  Total cholesterol 159 and LDL 90.  She is currently prescribed atorvastatin 40 mg daily. -No medication change for now.  Consider increasing atorvastatin to 80 mg daily or adding ezetimibe at a future appointment.      Anxiety and depression    She requested increase sertraline to 50 mg daily at her last appointment due to worsening symptoms of anxiety and depression.  She is additionally prescribed Xanax 0.25 mg daily as needed for anxiety relief today she states that her mood and anxiety have improved.  She is concerned that sertraline at 50 mg daily may be contributing to symptoms of nausea and she would like to reduce the dose back to 25 mg daily. -Reduce sertraline to 25 mg daily.  Continue Xanax as currently prescribed.      Urge incontinence    She describes urge incontinence and requests a referral to urology.  She is not interested in starting any medication today.      Return in about 3 months (around 12/14/2022).   Lucina Mellow  Durwin Nora, MD

## 2022-09-13 NOTE — Assessment & Plan Note (Signed)
She describes urge incontinence and requests a referral to urology.  She is not interested in starting any medication today.

## 2022-09-13 NOTE — Assessment & Plan Note (Signed)
Lipid panel updated earlier this month.  Total cholesterol 159 and LDL 90.  She is currently prescribed atorvastatin 40 mg daily. -No medication change for now.  Consider increasing atorvastatin to 80 mg daily or adding ezetimibe at a future appointment.

## 2022-09-13 NOTE — Patient Instructions (Signed)
It was a pleasure to see you today.  Thank you for giving [place] the opportunity to be involved in your care.  Below is a brief recap of your visit and next steps.  We will plan to see you again in 3 months.  Summary Urology referral placed today Dexcom ordered Reduce sertraline to 25 mg daily Mounjaro refilled Follow up in 3 months

## 2022-09-22 ENCOUNTER — Telehealth: Payer: Self-pay | Admitting: Pharmacy Technician

## 2022-09-22 NOTE — Telephone Encounter (Signed)
Auth Submission: APPROVED Site of care: Site of care: [facility] Payer: [employer] MEDICARE Medication & CPT/J Code(s) submitted: Prolia (Denosumab) [id number] Route of submission (phone, fax, portal): PORTAL Phone # Fax # Auth type: Buy/Bill PB Units/visits requested: X2 DOSES Reference number: [id number] Approval from: 09/22/22 to 09/22/23

## 2022-09-25 ENCOUNTER — Other Ambulatory Visit: Payer: Self-pay | Admitting: Internal Medicine

## 2022-09-25 DIAGNOSIS — B009 Herpesviral infection, unspecified: Secondary | ICD-10-CM

## 2022-09-25 DIAGNOSIS — R110 Nausea: Secondary | ICD-10-CM

## 2022-10-15 ENCOUNTER — Telehealth: Payer: Self-pay | Admitting: Internal Medicine

## 2022-10-15 NOTE — Telephone Encounter (Signed)
Pt has signed up for PREP classes a the [employer] with husband, and she is needing referral sent over to the [facility]. Classes start 9/17.  Wants a call back when referral is placed.

## 2022-10-15 NOTE — Progress Notes (Unsigned)
Name: Tajma Kielty DOB: March 21, 1945 MRN: [id number]  History of Present Illness: Ms. Prochazka is a 77 y.o. female who presents today as a new patient at [facility] [place]. All available relevant medical records have been reviewed. She is accompanied by her husband Marcy Salvo.  Today: She reports that for many years she has had bothersome urinary frequency, nocturia 2-4x, urgency, and urge incontinence. Leaking multiple times daily and using 4+ pads per day on average. She reports drinking only 1 caffeinated beverage per day on average (coffee in the morning). She states that years ago she took a medication for OAB symptoms but it was not helpful.   Also reports stress urinary incontinence. States the urge incontinence is more frequent / predominant but both are significantly bothersome.   She denies dysuria or gross hematuria. Reports occasional sensations of incomplete emptying; does some positional voiding.  She denies history of obstructive sleep apnea; states she has had a negative sleep study. She denies fluid intake within 3 hours prior to bedtime. She denies fluid intake during the night.  She denies caffeine intake within 8 hours prior to bedtime. She denies routinely experiencing lower extremity edema during the day.   Fall Screening: Do you usually have a device to assist in your mobility? No   Medications: Current Outpatient Medications  Medication Sig Dispense Refill   ALPRAZolam (XANAX) 0.5 MG tablet Take 0.25 mg by mouth daily as needed for anxiety.     aspirin 81 MG chewable tablet Chew 81 mg by mouth in the morning.     atorvastatin (LIPITOR) 40 MG tablet Take 1 tablet (40 mg total) by mouth daily. 90 tablet 3   Blood Glucose Monitoring Suppl (ACCU-CHEK AVIVA PLUS) w/Device KIT by Does not apply route.     cholecalciferol (VITAMIN D) 25 MCG (1000 UNIT) tablet Take 2,000 Units by mouth in the morning.     co-enzyme Q-10 30 MG capsule Take 30 mg by mouth 3  (three) times daily.     Continuous Glucose Receiver (DEXCOM G7 RECEIVER) DEVI 1 Device by Does not apply route continuous. 1 each 0   Continuous Glucose Sensor (DEXCOM G7 SENSOR) MISC 1 Device by Does not apply route every 14 (fourteen) days. 3 each 3   EPINEPHrine 0.3 mg/0.3 mL IJ SOAJ injection epinephrine 0.3 mg/0.3 mL injection, auto-injector (Patient taking differently: Inject 0.3 mg into the muscle as needed for anaphylaxis.) 1 each 1   glucose blood (ACCU-CHEK AVIVA PLUS) test strip TEST SUGAR 3-4 TIMES A DAY     Insulin Pen Needle (SURE COMFORT PEN NEEDLES) 31G X 5 MM MISC Sure Comfort Pen Needle 31 gauge x 5/16" 30 each 0   levothyroxine (SYNTHROID) 75 MCG tablet Take 1 tablet (75 mcg total) by mouth daily before breakfast. 90 tablet 1   mirabegron ER (MYRBETRIQ) 25 MG TB24 tablet Take 1 tablet (25 mg total) by mouth daily. 30 tablet 11   omeprazole (PRILOSEC) 20 MG capsule Take 1 capsule (20 mg total) by mouth daily. 90 capsule 3   ondansetron (ZOFRAN) 4 MG tablet TAKE 1 TABLET BY MOUTH EVERY 8 HOURS AS NEEDED FOR NAUSEA. 20 tablet 0   sertraline (ZOLOFT) 25 MG tablet Take 1 tablet (25 mg total) by mouth daily. 30 tablet 3   tirzepatide (MOUNJARO) 2.5 MG/0.5ML Pen Inject 2.5 mg into the skin once a week. 2 mL 2   TRESIBA FLEXTOUCH 200 UNIT/ML FlexTouch Pen INJECT 10 UNITS INTO THE SKIN TWICE DAILY. 9 mL 0  valACYclovir (VALTREX) 500 MG tablet TAKE 1 TABLET BY MOUTH ONCE DAILY. 90 tablet 0   rOPINIRole (REQUIP) 1 MG tablet Take 1 tablet (1 mg total) by mouth 3 (three) times daily. 90 tablet 2   No current facility-administered medications for this visit.    Allergies: Allergies  Allergen Reactions   Codeine Rash    Other reaction(s): Chest Pain   Gabapentin Other (See Comments)    Other reaction(s): Other Depression, suicidal thoughts   Sulfa Antibiotics Rash and Other (See Comments)    chest Pain   Atorvastatin Calcium Other (See Comments)    Other reaction(s):  Other myalgia   Escitalopram Other (See Comments)    Other reaction(s): Other Headaches   Ezetimibe Other (See Comments)    Other reaction(s): Other Myalgia-patient wants to retry generic 10/17   Fluvastatin Other (See Comments)    Other reaction(s): Other myalgia   Fosamax [Alendronate]     Other reaction(s): Other Leg pain   Levofloxacin Other (See Comments)    Other reaction(s): Other Muscle aching in her shoulders, inner thighs and legs   Pramipexole Nausea Only and Other (See Comments)    Headache    Prednisone Other (See Comments)    Unknown reaction type   Rosuvastatin Calcium Other (See Comments)    Other reaction(s): Other myalgia   Sertraline Other (See Comments)    Other reaction(s): Other Headaches   Simvastatin Other (See Comments)    Other reaction(s): Other myalgia   Sulfur Rash   Trazodone And Nefazodone Nausea Only    Past Medical History:  Diagnosis Date   Diabetes mellitus without complication (HCC)    Hypothyroidism    Thyroid disease    Past Surgical History:  Procedure Laterality Date   ABDOMINAL HYSTERECTOMY     ADENOIDECTOMY     BACK SURGERY     BIOPSY  09/06/2021   Procedure: BIOPSY;  Surgeon: Dolores Frame, MD;  Location: [facility];  Service: Gastroenterology;;   ESOPHAGOGASTRODUODENOSCOPY (EGD) WITH PROPOFOL N/A 09/06/2021   Procedure: ESOPHAGOGASTRODUODENOSCOPY (EGD) WITH PROPOFOL;  Surgeon: Dolores Frame, MD;  Location: [facility];  Service: Gastroenterology;  Laterality: N/A;  215 ASA 2   KNEE ARTHROSCOPY WITH MEDIAL MENISECTOMY Left 02/27/2022   Procedure: KNEE ARTHROSCOPY WITH MEDIAL MENISCECTOMY;  Surgeon: Vickki Hearing, MD;  Location: [facility];  Service: Orthopedics;  Laterality: Left;   SINOSCOPY     TONSILLECTOMY     History reviewed. No pertinent family history. Social History   Socioeconomic History   Marital status: Married    Spouse name: Not on file   Number of children: Not on file    Years of education: Not on file   Highest education level: Not on file  Occupational History   Not on file  Tobacco Use   Smoking status: Never   Smokeless tobacco: Never  Vaping Use   Vaping status: Never Used  Substance and Sexual Activity   Alcohol use: Not Currently   Drug use: Never   Sexual activity: Not Currently  Other Topics Concern   Not on file  Social History Narrative   Not on file   Social Determinants of Health   Financial Resource Strain: Low Risk  (03/15/2022)   Overall Financial Resource Strain (CARDIA)    Difficulty of Paying Living Expenses: Not hard at all  Food Insecurity: No Food Insecurity (03/15/2022)   Hunger Vital Sign    Worried About Running Out of Food in the Last Year: Never  true    Ran Out of Food in the Last Year: Never true  Transportation Needs: No Transportation Needs (03/15/2022)   PRAPARE - Administrator, Civil Service (Medical): No    Lack of Transportation (Non-Medical): No  Physical Activity: Sufficiently Active (03/15/2022)   Exercise Vital Sign    Days of Exercise per Week: 7 days    Minutes of Exercise per Session: 30 min  Stress: Stress Concern Present (03/15/2022)   [employer] of Occupational Health - Occupational Stress Questionnaire    Feeling of Stress : To some extent  Social Connections: Moderately Integrated (03/15/2022)   Social Connection and Isolation Panel [NHANES]    Frequency of Communication with Friends and Family: More than three times a week    Frequency of Social Gatherings with Friends and Family: More than three times a week    Attends Religious Services: More than 4 times per year    Active Member of [employer] or Organizations: No    Attends Banker Meetings: Never    Marital Status: Married  Catering manager Violence: Not At Risk (03/15/2022)   Humiliation, Afraid, Rape, and Kick questionnaire    Fear of Current or Ex-Partner: No    Emotionally Abused: No    Physically Abused: No     Sexually Abused: No    SUBJECTIVE  Review of Systems Constitutional: Patient denies any unintentional weight loss or change in strength lntegumentary: Patient denies any rashes or pruritus Eyes: Patient reports dry eyes ENT: Patient reports dry mouth Cardiovascular: Patient denies chest pain or syncope Respiratory: Patient denies shortness of breath Gastrointestinal: Patient denies nausea, vomiting, constipation, or diarrhea Musculoskeletal: Patient denies muscle cramps or weakness Neurologic: Patient denies convulsions or seizures Psychiatric: Patient denies memory problems Allergic/Immunologic: Patient denies recent allergic reaction(s) Hematologic/Lymphatic: Patient denies bleeding tendencies Endocrine: Patient denies heat/cold intolerance  GU: As per HPI.  OBJECTIVE Vitals:   10/17/22 0952  BP: 128/77  Pulse: 75  Temp: 97.7 F (36.5 C)   There is no height or weight on file to calculate BMI.  Physical Examination  Constitutional: No obvious distress; patient is non-toxic appearing  Cardiovascular: No visible lower extremity edema.  Respiratory: The patient does not have audible wheezing/stridor; respirations do not appear labored  Gastrointestinal: Abdomen non-distended Musculoskeletal: Normal ROM of UEs  Skin: No obvious rashes/open sores  Neurologic: CN 2-12 grossly intact Psychiatric: Answered questions appropriately with normal affect  Hematologic/Lymphatic/Immunologic: No obvious bruises or sites of spontaneous bleeding  UA: no evidence of UTI or microscopic hematuria PVR: 1 ml  ASSESSMENT Urge incontinence - Plan: Urinalysis, Routine w reflex microscopic, BLADDER SCAN AMB NON-IMAGING, mirabegron ER (MYRBETRIQ) 25 MG TB24 tablet  Type 2 diabetes mellitus without complication, with long-term current use of insulin (HCC) - Plan: Urinalysis, Routine w reflex microscopic, BLADDER SCAN AMB NON-IMAGING, mirabegron ER (MYRBETRIQ) 25 MG TB24 tablet  Mixed  stress and urge urinary incontinence - Plan: mirabegron ER (MYRBETRIQ) 25 MG TB24 tablet, Ambulatory referral to Urology  Urinary urgency - Plan: mirabegron ER (MYRBETRIQ) 25 MG TB24 tablet  Urinary frequency - Plan: mirabegron ER (MYRBETRIQ) 25 MG TB24 tablet  Nocturia - Plan: mirabegron ER (MYRBETRIQ) 25 MG TB24 tablet  Primary stress urinary incontinence  Stress incontinence, female - Plan: Ambulatory referral to Urology  We discussed the different forms of urinary incontinence, such as stress and urge incontinence, and described how symptoms are consistent with mixed urinary incontinence.  For stress urinary incontinence: We discussed expectant management versus nonsurgical  options versus surgery. Nonsurgical options include weight loss, Kegel exercises, with or without physical therapy, as well as an incontinence pessary. Surgical options include a midurethral sling, a Burch urethropexy, and transurethral injection of a bulking agent. She would like to proceed with internal referral to Dr. Sherron Monday in [facility] for SUI surgical consultation.  For OAB with urinary frequency, urgency, and urge incontinence: We discussed the symptoms of overactive bladder (OAB), which include urinary urgency, frequency, nocturia, with or without urge incontinence.  While we may not know the exact etiology of OAB, several risk factors can be identified.  - We discussed this patient's neurogenic risk factors for OAB-type symptoms including T2DM, age, prior back surgery.   We discussed the following management options in detail including potential benefits, risks, and side effects: Behavioral therapy: Modify fluid intake Decreasing bladder irritants (such as caffeine, acidic foods, spicy foods, alcohol) Urge suppression strategies Bladder retraining / timed voiding Double voiding Medication(s): - For anticholinergic medications, we discussed the potential side effects of anticholinergics including dry  eyes, dry mouth, constipation, cognitive impairment and urinary retention.  - For beta-3 agonist medication, we discussed the risk for urinary retention and the potential side effect of elevated blood pressure specific to Myrbetriq (which is more likely to occur in individuals with uncontrolled hypertension).  For refractory cases: PTNS (posterior tibial nerve stimulation) Sacral neuromodulation trial (Medtronic lnterStim or Axonics implant) Bladder Botox injections In extreme cases, SP tube placement  She decided to start Myrbetriq 25 mg daily.  Will plan for follow up in 8 weeks with Dr. Sherron Monday at [facility] for SUI surgical consultation and PVR check. Pt verbalized understanding and agreement. All questions were answered.  PLAN Advised the following: 1. Start Myrbetriq 25 mg daily. 2. Minimize caffeine intake. 3. Work on timed voiding. 4. Urge suppression strategies. 5. Return in about 8 weeks (around 12/12/2022) for with Dr. Sherron Monday at [facility] for SUI surgical consult & PVR check.  Orders Placed This Encounter  Procedures   Urinalysis, Routine w reflex microscopic   Ambulatory referral to Urology    Referral Priority:   Routine    Referral Type:   Consultation    Referral Reason:   Specialty Services Required    Requested Specialty:   Urology    Number of Visits Requested:   1   BLADDER SCAN AMB NON-IMAGING    It has been explained that the patient is to follow regularly with their PCP in addition to all other providers involved in their care and to follow instructions provided by these respective offices. Patient advised to contact urology clinic if any urologic-pertaining questions, concerns, new symptoms or problems arise in the interim period.  Patient Instructions  Overactive bladder (OAB) overview for patients:  Symptoms may include: urinary urgency ("gotta go" feeling) urinary frequency (voiding >8 times per day) night time urination  (nocturia) urge incontinence of urine (UUI)  While we do not know the exact etiology of OAB, several treatment options exist including:  Behavioral therapy: Reducing fluid intake Decreasing bladder stimulants (such as caffeine) and irritants (such as acidic food, spicy foods, alcohol) Urge suppression strategies Bladder retraining via timed voiding  Pelvic floor physical therapy  Medication(s) - can use one or both of the drug classes below. Anticholinergic / antimuscarinic medications:  Mechanism of action: Activate M3 receptors to reduce detrusor stimulation and increase bladder capacity   (parasympathetic nervous system). Effect: Relaxes the bladder to decrease overactivity, increase bladder storage capacity, and increase time between voids. Onset:  Slow acting (may take 8-12 weeks to determine efficacy). Medications include: Vesicare (Solifenacin), Ditropan (Oxybutynin), Detrol (Tolterodine), Toviaz (Fesoterodine), Sanctura (Trospium), Urispas (Flavoxate), Enablex (Darifenacin), Bentyl (Dicyclomine), Levsin (Hyoscyamine ). Potential side effects include but are not limited to: Dry eyes, dry mouth, constipation, cognitive impairment, dementia risk with long term use, and urinary retention/ incomplete bladder emptying. Insurance companies generally prefer for patients to try 1-2 anticholinergic / antimuscarinic medications first due to low cost. Some exceptions are made based on patient-specific comorbidities / risk factors. Beta-3 agonist medications: Mechanism of action: Stimulates selective B3 adrenergic receptors to cause smooth muscle bladder relaxation (sympathetic nervous system). Effect: Relaxes the bladder to decrease overactivity, increase bladder storage capacity, and increase time between voids. Onset: Slow acting (may take 8-12 weeks to determine efficacy). Medications include: Myrbetriq (Mirabegron) and Vibegron Leslye Peer). Potential side effects include but are not limited  to: urinary retention / incomplete bladder emptying and elevated blood pressure (more likely to occur in individuals with pre-existing uncontrolled hypertension). These medications tend to be more expensive than the anticholinergic / antimuscarinic medications.   For patients with refractory OAB (if the above treatment options have been unsuccessful): Posterior tibial nerve stimulation (PTNS). Small acupuncture-type needle inserted near ankle with electric current to stimulate bladder via posterior tibial nerve pathway. Initially requires 12 weekly in-office treatments lasting 30 minutes each; followed by monthly in-office treatments lasting 30 minutes each for 1 year.  Bladder Botox injections. How it is done: Typically done via in-office cystoscopy; sometimes done in the OR depending on the situation. The bladder is numbed with lidocaine instilled via a catheter. Then the urologist injects Botox into the bladder muscle wall in about 20 locations. Causes local paralysis of the bladder muscle at the injection sites to reduce bladder muscle overactivity / spasms. The effect lasts for approximately 6 months and cannot be reversed once performed. Risks may included but are not limited to: infection, incomplete bladder emptying/ urinary retention, short term need for self-catheterization or indwelling catheter, and need for repeat therapy. There is a 5-12% chance of needing to catheterize with Botox - that usually resolves in a few months as the Botox wears off. Typically Botox injections would need to be repeated every 3-12 months since this is not a permanent therapy.  Sacral neuromodulation trial (Medtronic lnterStim or Axonics implant). Sacral neuromodulation is FDA-approved for uncontrolled urinary urgency, urinary frequency, urinary urge incontinence, non-obstructive urinary retention, or fecal incontinence. It is not FDA-approved as a treatment for pain. The goal of this therapy is at least a 50%  improvement in symptoms. It is NOT realistic to expect a 100% cure. This is a a 2-step outpatient procedure. After a successful test period, a permanent wire and generator are placed in the OR. We discussed the risk of infection. We reviewed the fact that about 30% of patients fail the test phase and are not candidates for permanent generator placement. During the 1-2 week trial phase, symptoms are documented by the patient to determine response. If patient gets at least a 50% improvement in symptoms, they may then proceed with Step 2. Step 1: Trial lead placement. Per physician discretion, may done one of two ways: Percutaneous nerve evaluation (PNE) in the [facility] urology office. Performed by urologist under local anesthesia (numbing the area with lidocaine) using a spinal needle for placement of test wire, which usually stays in place for 5-7 days to determine therapy response. Test lead placement in OR under anesthesia. Usually stays in place 2 weeks to determine therapy  response. > Step 2: Permanent implantation of sacral neuromodulation device, which is performed in the OR.  Sacral neuromodulation implants: All are conditionally MRI safe. Manufacturer: Medtronic Website: [employer]gl therapy/right-for-you.html Options: lnterStim X: Non-rechargeable. The battery lasts 10 years on average. lnterStim Micro: Rechargeable. The battery lasts 15 years on average and must be charged routinely. Approximately 50% smaller implant than lnterStim X implant.  Manufacturer: Axonics Website: Findrealrelief.axonics.com Options: Non-rechargeable (Axonics F15): The battery lasts 15 years on average. Rechargeable (Axonics R20): The battery lasts 20 years on average and must be charged in office for about 1 hour every 6-10 months on average. Approximately 50% smaller implant than Axonics non-rechargeable implant.  Note: Generally  the rechargeable devices are only advised for very small or thin patients who may not have sufficient adipose tissue to comfortably overlay the implanted device.  Suprapubic catheter (SP tube) placement. Only done in severely refractory OAB when all other options have failed or are not a viable treatment choice depending on patient factors. Involves placement of a catheter through the lower abdomen into the bladder to continuously drain the bladder into an external collection bag, which patient can then empty at their convenience every few hours. Done via an outpatient surgical procedure in the OR under anesthesia. Risks may included but are not limited to: surgical site pain, infections, skin irritation / breakdown, chronic bacteriuria, symptomatic UTls. The SP tube must stay in place continuously. This is a reversible procedure however - the insertion site will close if catheter is removed for more than a few hours. The SP tube must be exchanged routinely every 4 weeks to prevent the catheter from becoming clogged with sediment. SP tube exchanges are typically performed at a urology nurse visit or by a home health nurse.   Electronically signed by:  Donnita Falls, MSN, FNP-C, CUNP 10/17/2022 11:24 AM

## 2022-10-16 ENCOUNTER — Other Ambulatory Visit: Payer: Self-pay

## 2022-10-16 DIAGNOSIS — E119 Type 2 diabetes mellitus without complications: Secondary | ICD-10-CM

## 2022-10-16 NOTE — Telephone Encounter (Signed)
Ref placed. Patient aware.

## 2022-10-17 ENCOUNTER — Encounter: Payer: Self-pay | Admitting: Urology

## 2022-10-17 ENCOUNTER — Ambulatory Visit: Payer: Medicare Other | Admitting: Urology

## 2022-10-17 VITALS — BP 128/77 | HR 75 | Temp 97.7°F

## 2022-10-17 DIAGNOSIS — R351 Nocturia: Secondary | ICD-10-CM | POA: Diagnosis not present

## 2022-10-17 DIAGNOSIS — E119 Type 2 diabetes mellitus without complications: Secondary | ICD-10-CM

## 2022-10-17 DIAGNOSIS — R3915 Urgency of urination: Secondary | ICD-10-CM

## 2022-10-17 DIAGNOSIS — N393 Stress incontinence (female) (male): Secondary | ICD-10-CM

## 2022-10-17 DIAGNOSIS — R350 Frequency of micturition: Secondary | ICD-10-CM | POA: Diagnosis not present

## 2022-10-17 DIAGNOSIS — Z794 Long term (current) use of insulin: Secondary | ICD-10-CM

## 2022-10-17 DIAGNOSIS — N3941 Urge incontinence: Secondary | ICD-10-CM

## 2022-10-17 DIAGNOSIS — N3946 Mixed incontinence: Secondary | ICD-10-CM

## 2022-10-17 LAB — URINALYSIS, ROUTINE W REFLEX MICROSCOPIC
Bilirubin, UA: NEGATIVE
Ketones, UA: NEGATIVE
Leukocytes,UA: NEGATIVE
Nitrite, UA: NEGATIVE
Protein,UA: NEGATIVE
RBC, UA: NEGATIVE
Specific Gravity, UA: 1.015 (ref 1.005–1.030)
Urobilinogen, Ur: 0.2 mg/dL (ref 0.2–1.0)
pH, UA: 6 (ref 5.0–7.5)

## 2022-10-17 LAB — BLADDER SCAN AMB NON-IMAGING: Scan Result: 1

## 2022-10-17 MED ORDER — MIRABEGRON ER 25 MG PO TB24: 25.0000 mg | ORAL_TABLET | Freq: Every day | ORAL | 11 refills | Status: DC

## 2022-10-17 NOTE — Patient Instructions (Signed)
Overactive bladder (OAB) overview for patients:  Symptoms may include: urinary urgency ("gotta go" feeling) urinary frequency (voiding >8 times per day) night time urination (nocturia) urge incontinence of urine (UUI)  While we do not know the exact etiology of OAB, several treatment options exist including:  Behavioral therapy: Reducing fluid intake Decreasing bladder stimulants (such as caffeine) and irritants (such as acidic food, spicy foods, alcohol) Urge suppression strategies Bladder retraining via timed voiding Pelvic floor physical therapy  Medication(s) - can use one or both of the drug classes below. Anticholinergic / antimuscarinic medications:  Mechanism of action: Activate M3 receptors to reduce detrusor stimulation and increase bladder capacity  (parasympathetic nervous system). Effect: Relaxes the bladder to decrease overactivity, increase bladder storage capacity, and increase time between voids. Onset: Slow acting (may take 8-12 weeks to determine efficacy). Medications include: Vesicare (Solifenacin), Ditropan (Oxybutynin), Detrol (Tolterodine), Toviaz (Fesoterodine), Sanctura (Trospium), Urispas (Flavoxate), Enablex (Darifenacin), Bentyl (Dicyclomine), Levsin (Hyoscyamine ). Potential side effects include but are not limited to: Dry eyes, dry mouth, constipation, cognitive impairment, dementia risk with long term use, and urinary retention/ incomplete bladder emptying. Insurance companies generally prefer for patients to try 1-2 anticholinergic / antimuscarinic medications first due to low cost. Some exceptions are made based on patient-specific comorbidities / risk factors. Beta-3 agonist medications: Mechanism of action: Stimulates selective B3 adrenergic receptors to cause smooth muscle bladder relaxation (sympathetic nervous system). Effect: Relaxes the bladder to decrease overactivity, increase bladder storage capacity, and increase time between voids. Onset:  Slow acting (may take 8-12 weeks to determine efficacy). Medications include: Myrbetriq (Mirabegron) and Vibegron (Gemtesa). Potential side effects include but are not limited to: urinary retention / incomplete bladder emptying and elevated blood pressure (more likely to occur in individuals with pre-existing uncontrolled hypertension). These medications tend to be more expensive than the anticholinergic / antimuscarinic medications.   For patients with refractory OAB (if the above treatment options have been unsuccessful): Posterior tibial nerve stimulation (PTNS). Small acupuncture-type needle inserted near ankle with electric current to stimulate bladder via posterior tibial nerve pathway. Initially requires 12 weekly in-office treatments lasting 30 minutes each; followed by monthly in-office treatments lasting 30 minutes each for 1 year.  Bladder Botox injections. How it is done: Typically done via in-office cystoscopy; sometimes done in the OR depending on the situation. The bladder is numbed with lidocaine instilled via a catheter. Then the urologist injects Botox into the bladder muscle wall in about 20 locations. Causes local paralysis of the bladder muscle at the injection sites to reduce bladder muscle overactivity / spasms. The effect lasts for approximately 6 months and cannot be reversed once performed. Risks may included but are not limited to: infection, incomplete bladder emptying/ urinary retention, short term need for self-catheterization or indwelling catheter, and need for repeat therapy. There is a 5-12% chance of needing to catheterize with Botox - that usually resolves in a few months as the Botox wears off. Typically Botox injections would need to be repeated every 3-12 months since this is not a permanent therapy.  Sacral neuromodulation trial (Medtronic lnterStim or Axonics implant). Sacral neuromodulation is FDA-approved for uncontrolled urinary urgency, urinary frequency,  urinary urge incontinence, non-obstructive urinary retention, or fecal incontinence. It is not FDA-approved as a treatment for pain. The goal of this therapy is at least a 50% improvement in symptoms. It is NOT realistic to expect a 100% cure. This is a a 2-step outpatient procedure. After a successful test period, a permanent wire and generator are placed   in the OR. We discussed the risk of infection. We reviewed the fact that about 30% of patients fail the test phase and are not candidates for permanent generator placement. During the 1-2 week trial phase, symptoms are documented by the patient to determine response. If patient gets at least a 50% improvement in symptoms, they may then proceed with Step 2. Step 1: Trial lead placement. Per physician discretion, may done one of two ways: Percutaneous nerve evaluation (PNE) in the [facility] office. Performed by urologist under local anesthesia (numbing the area with lidocaine) using a spinal needle for placement of test wire, which usually stays in place for 5-7 days to determine therapy response. Test lead placement in OR under anesthesia. Usually stays in place 2 weeks to determine therapy response. > Step 2: Permanent implantation of sacral neuromodulation device, which is performed in the OR.  Sacral neuromodulation implants: All are conditionally MRI safe. Manufacturer: Medtronic Website: www.medtronic.com/[place]-en/patients/treatments-therapies/neurostimulator-overactive-bladder/getting therapy/right-for-you.html Options: lnterStim X: Non-rechargeable. The battery lasts 10 years on average. lnterStim Micro: Rechargeable. The battery lasts 15 years on average and must be charged routinely. Approximately 50% smaller implant than lnterStim X implant.  Manufacturer: Axonics Website: Findrealrelief.axonics.com Options: Non-rechargeable (Axonics F15): The battery lasts 15 years on average. Rechargeable (Axonics R20): The battery lasts 20 years on  average and must be charged in office for about 1 hour every 6-10 months on average. Approximately 50% smaller implant than Axonics non-rechargeable implant.  Note: Generally the rechargeable devices are only advised for very small or thin patients who may not have sufficient adipose tissue to comfortably overlay the implanted device.  Suprapubic catheter (SP tube) placement. Only done in severely refractory OAB when all other options have failed or are not a viable treatment choice depending on patient factors. Involves placement of a catheter through the lower abdomen into the bladder to continuously drain the bladder into an external collection bag, which patient can then empty at their convenience every few hours. Done via an outpatient surgical procedure in the OR under anesthesia. Risks may included but are not limited to: surgical site pain, infections, skin irritation / breakdown, chronic bacteriuria, symptomatic UTls. The SP tube must stay in place continuously. This is a reversible procedure however - the insertion site will close if catheter is removed for more than a few hours. The SP tube must be exchanged routinely every 4 weeks to prevent the catheter from becoming clogged with sediment. SP tube exchanges are typically performed at a urology nurse visit or by a home health nurse.  

## 2022-10-18 ENCOUNTER — Telehealth: Payer: Self-pay | Admitting: *Deleted

## 2022-10-18 ENCOUNTER — Other Ambulatory Visit: Payer: Self-pay | Admitting: Internal Medicine

## 2022-10-18 DIAGNOSIS — R110 Nausea: Secondary | ICD-10-CM

## 2022-10-18 MED ORDER — ONDANSETRON HCL 4 MG PO TABS: 4.0000 mg | ORAL_TABLET | Freq: Three times a day (TID) | ORAL | 0 refills | Status: DC | PRN

## 2022-10-18 NOTE — Telephone Encounter (Signed)
Contacted patient's husband on 08/10/2022 regarding PREP Class referral. Karen Horn was interested in attending as well. Her referral was obtained today. Class to begin 11/06/2022 every T/TH 3:00-4:15pm at the [facility] for 12 weeks. I will call her back to schedule her intake assessment.

## 2022-10-19 ENCOUNTER — Other Ambulatory Visit: Payer: Self-pay | Admitting: Internal Medicine

## 2022-10-19 DIAGNOSIS — E1169 Type 2 diabetes mellitus with other specified complication: Secondary | ICD-10-CM

## 2022-10-19 DIAGNOSIS — E119 Type 2 diabetes mellitus without complications: Secondary | ICD-10-CM

## 2022-10-26 ENCOUNTER — Telehealth: Payer: Self-pay | Admitting: *Deleted

## 2022-10-26 NOTE — Telephone Encounter (Signed)
Contacted Mrs. Rorke regarding PREP Class start date on 11/06/2022. This class will be delayed until October due to a portion of the class having scheduling issues. She is aware that I will call her back once new dates are determined.

## 2022-10-30 ENCOUNTER — Encounter: Payer: Medicare Other | Attending: Internal Medicine | Admitting: *Deleted

## 2022-10-30 ENCOUNTER — Encounter (HOSPITAL_COMMUNITY): Admission: RE | Admit: 2022-10-30 | Payer: Medicare Other | Source: Ambulatory Visit

## 2022-10-30 VITALS — BP 117/65 | HR 72 | Temp 97.6°F | Resp 18

## 2022-10-30 DIAGNOSIS — M8589 Other specified disorders of bone density and structure, multiple sites: Secondary | ICD-10-CM | POA: Diagnosis not present

## 2022-10-30 DIAGNOSIS — M816 Localized osteoporosis [Lequesne]: Secondary | ICD-10-CM

## 2022-10-30 MED ORDER — DENOSUMAB 60 MG/ML ~~LOC~~ SOSY
60.0000 mg | PREFILLED_SYRINGE | Freq: Once | SUBCUTANEOUS | Status: AC
  Administered 2022-10-30: 60 mg via SUBCUTANEOUS

## 2022-10-30 NOTE — Progress Notes (Signed)
Diagnosis: Osteoporosis  Provider:  Christel Mormon MD  Procedure: Injection  Prolia (Denosumab), Dose: 60 mg, Site: subcutaneous, Number of injections: 1  Post Care: Observation period completed  Discharge: Condition: Good, Destination: Home . AVS Provided  Performed by:  Daleen Squibb, RN

## 2022-11-01 ENCOUNTER — Ambulatory Visit (HOSPITAL_COMMUNITY): Payer: Medicare Other

## 2022-11-06 ENCOUNTER — Other Ambulatory Visit: Payer: Self-pay | Admitting: Internal Medicine

## 2022-11-06 DIAGNOSIS — E039 Hypothyroidism, unspecified: Secondary | ICD-10-CM

## 2022-11-06 MED ORDER — LEVOTHYROXINE SODIUM 75 MCG PO TABS
75.0000 ug | ORAL_TABLET | Freq: Every day | ORAL | 1 refills | Status: DC
Start: 2022-11-06 — End: 2023-04-25

## 2022-11-14 ENCOUNTER — Encounter: Payer: Self-pay | Admitting: Internal Medicine

## 2022-11-14 ENCOUNTER — Other Ambulatory Visit: Payer: Self-pay

## 2022-11-14 DIAGNOSIS — E119 Type 2 diabetes mellitus without complications: Secondary | ICD-10-CM

## 2022-11-28 ENCOUNTER — Telehealth: Payer: Self-pay | Admitting: *Deleted

## 2022-11-28 NOTE — Telephone Encounter (Signed)
Left voice message regarding PREP class to start in November. Requested return call.

## 2022-12-04 ENCOUNTER — Encounter: Payer: Self-pay | Admitting: Internal Medicine

## 2022-12-04 DIAGNOSIS — E119 Type 2 diabetes mellitus without complications: Secondary | ICD-10-CM

## 2022-12-04 MED ORDER — TIRZEPATIDE 2.5 MG/0.5ML ~~LOC~~ SOAJ
2.5000 mg | SUBCUTANEOUS | 2 refills | Status: DC
Start: 2022-12-04 — End: 2022-12-20

## 2022-12-11 DIAGNOSIS — Z794 Long term (current) use of insulin: Secondary | ICD-10-CM | POA: Diagnosis not present

## 2022-12-11 DIAGNOSIS — E119 Type 2 diabetes mellitus without complications: Secondary | ICD-10-CM | POA: Diagnosis not present

## 2022-12-12 LAB — HEMOGLOBIN A1C
Est. average glucose Bld gHb Est-mCnc: 157 mg/dL
Hgb A1c MFr Bld: 7.1 % — ABNORMAL HIGH (ref 4.8–5.6)

## 2022-12-14 ENCOUNTER — Ambulatory Visit: Payer: Medicare Other | Admitting: Internal Medicine

## 2022-12-18 DIAGNOSIS — M25551 Pain in right hip: Secondary | ICD-10-CM | POA: Diagnosis not present

## 2022-12-18 DIAGNOSIS — M25552 Pain in left hip: Secondary | ICD-10-CM | POA: Diagnosis not present

## 2022-12-20 ENCOUNTER — Encounter: Payer: Self-pay | Admitting: Internal Medicine

## 2022-12-20 ENCOUNTER — Ambulatory Visit (INDEPENDENT_AMBULATORY_CARE_PROVIDER_SITE_OTHER): Payer: Medicare Other | Admitting: Internal Medicine

## 2022-12-20 VITALS — BP 112/62 | HR 76 | Ht 60.5 in | Wt 103.1 lb

## 2022-12-20 DIAGNOSIS — Z794 Long term (current) use of insulin: Secondary | ICD-10-CM

## 2022-12-20 DIAGNOSIS — B009 Herpesviral infection, unspecified: Secondary | ICD-10-CM | POA: Diagnosis not present

## 2022-12-20 DIAGNOSIS — F419 Anxiety disorder, unspecified: Secondary | ICD-10-CM | POA: Diagnosis not present

## 2022-12-20 DIAGNOSIS — E119 Type 2 diabetes mellitus without complications: Secondary | ICD-10-CM | POA: Diagnosis not present

## 2022-12-20 DIAGNOSIS — F32A Depression, unspecified: Secondary | ICD-10-CM | POA: Diagnosis not present

## 2022-12-20 MED ORDER — VALACYCLOVIR HCL 500 MG PO TABS
500.0000 mg | ORAL_TABLET | Freq: Every day | ORAL | 3 refills | Status: DC
Start: 2022-12-20 — End: 2023-12-12

## 2022-12-20 MED ORDER — SERTRALINE HCL 25 MG PO TABS
25.0000 mg | ORAL_TABLET | Freq: Every day | ORAL | 3 refills | Status: DC
Start: 2022-12-20 — End: 2023-03-21

## 2022-12-20 NOTE — Progress Notes (Signed)
Established Patient Office Visit  Subjective   Patient ID: Karen Horn, female    DOB: 1946-02-06  Age: 77 y.o. MRN: [id number]  Chief Complaint  Patient presents with   Care Management    3 month f/u, reports seeing [facility] yesterday, recommending hip replacement, they will be faxing a medical clearance form .    Karen Horn returns to care today for routine follow-up.  She was last evaluated by me on 7/25.  Sertraline was reduced to 25 mg daily at that time.  No additional medication changes were made and 71-month follow-up was arranged.  In the interim, she was evaluated by urology and orthopedic surgery.  There have otherwise been no acute interval events.  Karen Horn reports feeling fairly well today.  She endorses chronic bilateral hip pain and reports that she will undergo bilateral hip arthroplasty in late February/early March 2025.  Her acute concern today is diabetes.  She is taking Mounjaro 2.5 mg weekly but is concerned about weight loss and persistent nausea.  Past Medical History:  Diagnosis Date   Diabetes mellitus without complication (HCC)    Hypothyroidism    Thyroid disease    Past Surgical History:  Procedure Laterality Date   ABDOMINAL HYSTERECTOMY     ADENOIDECTOMY     BACK SURGERY     BIOPSY  09/06/2021   Procedure: BIOPSY;  Surgeon: Marguerita Merles, Reuel Boom, MD;  Location: [facility];  Service: Gastroenterology;;   ESOPHAGOGASTRODUODENOSCOPY (EGD) WITH PROPOFOL N/A 09/06/2021   Procedure: ESOPHAGOGASTRODUODENOSCOPY (EGD) WITH PROPOFOL;  Surgeon: Dolores Frame, MD;  Location: [facility];  Service: Gastroenterology;  Laterality: N/A;  215 ASA 2   KNEE ARTHROSCOPY WITH MEDIAL MENISECTOMY Left 02/27/2022   Procedure: KNEE ARTHROSCOPY WITH MEDIAL MENISCECTOMY;  Surgeon: Vickki Hearing, MD;  Location: [facility];  Service: Orthopedics;  Laterality: Left;   SINOSCOPY     TONSILLECTOMY     Social History   Tobacco Use   Smoking status:  Never   Smokeless tobacco: Never  Vaping Use   Vaping status: Never Used  Substance Use Topics   Alcohol use: Not Currently   Drug use: Never   History reviewed. No pertinent family history. Allergies  Allergen Reactions   Codeine Rash    Other reaction(s): Chest Pain   Gabapentin Other (See Comments)    Other reaction(s): Other Depression, suicidal thoughts   Sulfa Antibiotics Rash and Other (See Comments)    chest Pain   Atorvastatin Calcium Other (See Comments)    Other reaction(s): Other myalgia   Escitalopram Other (See Comments)    Other reaction(s): Other Headaches   Ezetimibe Other (See Comments)    Other reaction(s): Other Myalgia-patient wants to retry generic 10/17   Fluvastatin Other (See Comments)    Other reaction(s): Other myalgia   Fosamax [Alendronate]     Other reaction(s): Other Leg pain   Levofloxacin Other (See Comments)    Other reaction(s): Other Muscle aching in her shoulders, inner thighs and legs   Pramipexole Nausea Only and Other (See Comments)    Headache    Prednisone Other (See Comments)    Unknown reaction type   Rosuvastatin Calcium Other (See Comments)    Other reaction(s): Other myalgia   Simvastatin Other (See Comments)    Other reaction(s): Other myalgia   Sulfur Rash   Trazodone And Nefazodone Nausea Only   Review of Systems  Musculoskeletal:  Positive for joint pain (Chronic bilateral hip pain).  All other systems  reviewed and are negative.    Objective:     BP 112/62   Pulse 76   Ht 5' 0.5" (1.537 m)   Wt 103 lb 1 oz (46.7 kg)   SpO2 98%   BMI 19.80 kg/m  BP Readings from Last 3 Encounters:  12/20/22 112/62  10/30/22 117/65  10/17/22 128/77   Physical Exam Vitals reviewed.  Constitutional:      General: She is not in acute distress.    Appearance: Normal appearance. She is not toxic-appearing.  HENT:     Head: Normocephalic and atraumatic.     Right Ear: External ear normal.     Left Ear: External ear  normal.     Nose: Nose normal. No congestion or rhinorrhea.     Mouth/Throat:     Mouth: Mucous membranes are moist.     Pharynx: Oropharynx is clear. No oropharyngeal exudate or posterior oropharyngeal erythema.  Eyes:     General: No scleral icterus.    Extraocular Movements: Extraocular movements intact.     Conjunctiva/sclera: Conjunctivae normal.     Pupils: Pupils are equal, round, and reactive to light.  Cardiovascular:     Rate and Rhythm: Normal rate and regular rhythm.     Pulses: Normal pulses.     Heart sounds: Normal heart sounds. No murmur heard.    No friction rub. No gallop.  Pulmonary:     Effort: Pulmonary effort is normal.     Breath sounds: Normal breath sounds. No wheezing, rhonchi or rales.  Abdominal:     General: Abdomen is flat. Bowel sounds are normal. There is no distension.     Palpations: Abdomen is soft.     Tenderness: There is no abdominal tenderness.  Musculoskeletal:        General: No swelling. Normal range of motion.     Cervical back: Normal range of motion.     Right lower leg: No edema.     Left lower leg: No edema.  Lymphadenopathy:     Cervical: No cervical adenopathy.  Skin:    General: Skin is warm and dry.     Capillary Refill: Capillary refill takes less than 2 seconds.     Coloration: Skin is not jaundiced.  Neurological:     General: No focal deficit present.     Mental Status: She is alert and oriented to person, place, and time.  Psychiatric:        Mood and Affect: Mood normal.        Behavior: Behavior normal.   Last CBC Lab Results  Component Value Date   WBC 6.4 02/23/2022   HGB 13.6 02/23/2022   HCT 42.4 02/23/2022   MCV 90.6 02/23/2022   MCH 29.1 02/23/2022   RDW 13.6 02/23/2022   PLT 228 02/23/2022   Last metabolic panel Lab Results  Component Value Date   GLUCOSE 218 (H) 02/23/2022   NA 134 (L) 02/23/2022   K 4.0 02/23/2022   CL 104 02/23/2022   CO2 21 (L) 02/23/2022   BUN 14 02/23/2022   CREATININE  0.70 02/23/2022   GFRNONAA >60 02/23/2022   CALCIUM 9.3 02/23/2022   PROT 6.7 01/15/2022   ALBUMIN 4.3 01/15/2022   LABGLOB 2.4 01/15/2022   AGRATIO 1.8 01/15/2022   BILITOT 0.3 01/15/2022   ALKPHOS 47 01/15/2022   AST 17 01/15/2022   ALT 18 01/15/2022   ANIONGAP 9 02/23/2022   Last lipids Lab Results  Component Value Date   CHOL 159  09/10/2022   HDL 55 09/10/2022   LDLCALC 90 09/10/2022   TRIG 75 09/10/2022   CHOLHDL 2.9 09/10/2022   Last hemoglobin A1c Lab Results  Component Value Date   HGBA1C 7.1 (H) 12/11/2022   Last thyroid functions Lab Results  Component Value Date   TSH 1.590 01/15/2022   Last vitamin D Lab Results  Component Value Date   VD25OH 31.5 01/15/2022   Last vitamin B12 and Folate Lab Results  Component Value Date   VITAMINB12 397 01/15/2022   FOLATE 10.6 01/15/2022   The 10-year ASCVD risk score (Arnett DK, et al., 2019) is: 25.1%    Assessment & Plan:   Problem List Items Addressed This Visit       Type 2 diabetes mellitus (HCC) - Primary    A1c 7.1 when updated last week.  She is currently prescribed Mounjaro 2.5 mg weekly and Tresiba 10 units every morning.  She is pleased with her progress, but is concerned about continuing Mounjaro due to weight loss and persistent nausea. -Through shared decision making, Greggory Keen has been discontinued.  She will continue Tresiba 10 units every morning and increase her dose based on a.m. blood sugar readings.  We discussed adding SGLT2 therapy and will consider this if blood sugar readings significantly increase after discontinuing Mounjaro.  She is in agreement with this plan. -Diabetes related preventative care items are up-to-date -Follow-up in 3 months for diabetes management       Anxiety and depression    Mood remains stable and anxiety adequately controlled with sertraline 25 mg daily.  She is also prescribed Xanax 0.25 mg daily as needed for anxiety relief, but states that she has not needed  to take it recently.      Return in about 3 months (around 03/22/2023) for DM management.   Billie Lade, MD

## 2022-12-20 NOTE — Assessment & Plan Note (Addendum)
A1c 7.1 when updated last week.  She is currently prescribed Mounjaro 2.5 mg weekly and Tresiba 10 units every morning.  She is pleased with her progress, but is concerned about continuing Mounjaro due to weight loss and persistent nausea. -Through shared decision making, Greggory Keen has been discontinued.  She will continue Tresiba 10 units every morning and increase her dose based on a.m. blood sugar readings.  We discussed adding SGLT2 therapy and will consider this if blood sugar readings significantly increase after discontinuing Mounjaro.  She is in agreement with this plan. -Diabetes related preventative care items are up-to-date -Follow-up in 3 months for diabetes management

## 2022-12-20 NOTE — Patient Instructions (Signed)
It was a pleasure to see you today.  Thank you for giving [place] the opportunity to be involved in your care.  Below is a brief recap of your visit and next steps.  We will plan to see you again in 3 months.  Summary Discontinue Mounjaro Increase Evaristo Bury as needed based on AM blood sugar readings Follow up in 3 months

## 2022-12-20 NOTE — Assessment & Plan Note (Signed)
Mood remains stable and anxiety adequately controlled with sertraline 25 mg daily.  She is also prescribed Xanax 0.25 mg daily as needed for anxiety relief, but states that she has not needed to take it recently.

## 2022-12-26 ENCOUNTER — Encounter: Payer: Self-pay | Admitting: Internal Medicine

## 2022-12-26 DIAGNOSIS — E119 Type 2 diabetes mellitus without complications: Secondary | ICD-10-CM

## 2022-12-27 ENCOUNTER — Other Ambulatory Visit (HOSPITAL_COMMUNITY): Payer: Self-pay | Admitting: Internal Medicine

## 2022-12-27 DIAGNOSIS — Z1231 Encounter for screening mammogram for malignant neoplasm of breast: Secondary | ICD-10-CM

## 2022-12-27 MED ORDER — EMPAGLIFLOZIN 10 MG PO TABS: 10.0000 mg | ORAL_TABLET | Freq: Every day | ORAL | 2 refills | Status: DC

## 2022-12-28 ENCOUNTER — Other Ambulatory Visit: Payer: Self-pay | Admitting: Internal Medicine

## 2022-12-28 DIAGNOSIS — E119 Type 2 diabetes mellitus without complications: Secondary | ICD-10-CM

## 2023-01-03 ENCOUNTER — Telehealth: Payer: Self-pay | Admitting: Internal Medicine

## 2023-01-03 NOTE — Telephone Encounter (Signed)
There is no message attached.  

## 2023-01-04 ENCOUNTER — Other Ambulatory Visit: Payer: Self-pay | Admitting: Internal Medicine

## 2023-01-22 ENCOUNTER — Encounter: Payer: Self-pay | Admitting: Internal Medicine

## 2023-01-25 ENCOUNTER — Telehealth: Payer: Self-pay

## 2023-01-25 ENCOUNTER — Other Ambulatory Visit: Payer: Self-pay

## 2023-01-25 MED ORDER — EMPAGLIFLOZIN 25 MG PO TABS: 25.0000 mg | ORAL_TABLET | Freq: Every day | ORAL | 0 refills | Status: DC

## 2023-01-25 NOTE — Telephone Encounter (Signed)
Copied from CRM [id number]. Topic: Clinical - Prescription Issue >> Jan 25, 2023  1:31 PM [name] wrote: Reason for CRM: pt is wanting to know if Dr.Dixon wants to increase empagliflozin (JARDIANCE) 10 MG TABS tablet before she refills it.

## 2023-01-25 NOTE — Telephone Encounter (Signed)
Patient advised, new script sent

## 2023-01-28 ENCOUNTER — Other Ambulatory Visit: Payer: Self-pay | Admitting: Internal Medicine

## 2023-01-28 DIAGNOSIS — G2581 Restless legs syndrome: Secondary | ICD-10-CM

## 2023-01-29 MED ORDER — ROPINIROLE HCL 1 MG PO TABS: 1.0000 mg | ORAL_TABLET | Freq: Three times a day (TID) | ORAL | 2 refills | Status: DC

## 2023-02-04 ENCOUNTER — Ambulatory Visit (HOSPITAL_COMMUNITY)
Admission: RE | Admit: 2023-02-04 | Discharge: 2023-02-04 | Disposition: A | Discharge status: Home / Self Care | Payer: Medicare Other | Source: Ambulatory Visit | Attending: Internal Medicine | Admitting: Internal Medicine

## 2023-02-04 DIAGNOSIS — Z1231 Encounter for screening mammogram for malignant neoplasm of breast: Secondary | ICD-10-CM | POA: Insufficient documentation

## 2023-02-22 ENCOUNTER — Other Ambulatory Visit: Payer: Self-pay | Admitting: Internal Medicine

## 2023-02-22 DIAGNOSIS — R0989 Other specified symptoms and signs involving the circulatory and respiratory systems: Secondary | ICD-10-CM

## 2023-02-22 DIAGNOSIS — M160 Bilateral primary osteoarthritis of hip: Secondary | ICD-10-CM | POA: Diagnosis not present

## 2023-02-22 DIAGNOSIS — K219 Gastro-esophageal reflux disease without esophagitis: Secondary | ICD-10-CM

## 2023-02-22 DIAGNOSIS — K580 Irritable bowel syndrome with diarrhea: Secondary | ICD-10-CM

## 2023-02-22 NOTE — Telephone Encounter (Signed)
 Copied from CRM [id number]. Topic: Clinical - Medication Refill >> Feb 22, 2023  2:20 PM Ivette P wrote: Most Recent Primary Care Visit:  Provider: DIXON, PHILLIP E  Department: [facility]  Visit Type: OFFICE VISIT  Date: 12/20/2022  Medication: empagliflozin  (JARDIANCE ) 25 MG TABS tablet  Has the patient contacted their pharmacy? Yes (Agent: If no, request that the patient contact the pharmacy for the refill. If patient does not wish to contact the pharmacy document the reason why and proceed with request.) (Agent: If yes, when and what did the pharmacy advise?)  Is this the correct pharmacy for this prescription? Yes If no, delete pharmacy and type the correct one.  This is the patient's preferred pharmacy:  [facility] [place], [place] - [address] [place] [address] Phone: [phone] Fax: [phone]    Has the prescription been filled recently? No  Is the patient out of the medication? No  Has the patient been seen for an appointment in the last year OR does the patient have an upcoming appointment? Yes  Can we respond through MyChart? No  Agent: Please be advised that Rx refills may take up to 3 business days. We ask that you follow-up with your pharmacy.

## 2023-02-25 ENCOUNTER — Other Ambulatory Visit: Payer: Self-pay

## 2023-02-25 DIAGNOSIS — E119 Type 2 diabetes mellitus without complications: Secondary | ICD-10-CM

## 2023-02-25 MED ORDER — EMPAGLIFLOZIN 10 MG PO TABS: 10.0000 mg | ORAL_TABLET | Freq: Every day | ORAL | 2 refills | Status: DC

## 2023-03-04 ENCOUNTER — Ambulatory Visit (INDEPENDENT_AMBULATORY_CARE_PROVIDER_SITE_OTHER): Payer: Medicare Other | Admitting: Family Medicine

## 2023-03-04 ENCOUNTER — Encounter: Payer: Self-pay | Admitting: Family Medicine

## 2023-03-04 ENCOUNTER — Ambulatory Visit: Payer: Self-pay | Admitting: Internal Medicine

## 2023-03-04 VITALS — Ht 60.05 in

## 2023-03-04 DIAGNOSIS — J0100 Acute maxillary sinusitis, unspecified: Secondary | ICD-10-CM

## 2023-03-04 MED ORDER — PROMETHAZINE-DM 6.25-15 MG/5ML PO SYRP: 5.0000 mL | ORAL_SOLUTION | Freq: Four times a day (QID) | ORAL | 0 refills | Status: AC | PRN

## 2023-03-04 MED ORDER — AMOXICILLIN-POT CLAVULANATE 875-125 MG PO TABS: 1.0000 | ORAL_TABLET | Freq: Two times a day (BID) | ORAL | 0 refills | Status: DC

## 2023-03-04 NOTE — Progress Notes (Signed)
 Acute Office Visit  Subjective:    Patient ID: Karen Horn, female    DOB: [birth year], 78 y.o.   MRN: [id number]  Chief Complaint  Patient presents with   Acute Visit    Sinus pain and congestion w/ cough and colored mucus( green), nausea,  ear pain, Headache, sore throat x 2 weeks     HPI The patient presents today with complaints of nasal congestion, cough with purulent sputum, facial pain and pressure, and earaches, which have been ongoing for 2 weeks. She denies nausea, vomiting, diarrhea, fever, chills, or body aches. She has been treating her symptoms with an over-the-counter regimen of Tylenol  and Diabetic Tussin cough syrup but reports minimal relief.  Past Medical History:  Diagnosis Date   Diabetes mellitus without complication (HCC)    Hypothyroidism    Thyroid  disease     Past Surgical History:  Procedure Laterality Date   ABDOMINAL HYSTERECTOMY     ADENOIDECTOMY     BACK SURGERY     BIOPSY  09/06/2021   Procedure: BIOPSY;  Surgeon: Eartha Angelia Sieving, MD;  Location: [facility];  Service: Gastroenterology;;   ESOPHAGOGASTRODUODENOSCOPY (EGD) WITH PROPOFOL  N/A 09/06/2021   Procedure: ESOPHAGOGASTRODUODENOSCOPY (EGD) WITH PROPOFOL ;  Surgeon: Eartha Angelia Sieving, MD;  Location: [facility];  Service: Gastroenterology;  Laterality: N/A;  215 ASA 2   KNEE ARTHROSCOPY WITH MEDIAL MENISECTOMY Left 02/27/2022   Procedure: KNEE ARTHROSCOPY WITH MEDIAL MENISCECTOMY;  Surgeon: Margrette Taft BRAVO, MD;  Location: [facility];  Service: Orthopedics;  Laterality: Left;   SINOSCOPY     TONSILLECTOMY      History reviewed. No pertinent family history.  Social History   Socioeconomic History   Marital status: Married    Spouse name: Not on file   Number of children: Not on file   Years of education: Not on file   Highest education level: Not on file  Occupational History   Not on file  Tobacco Use   Smoking status: Never   Smokeless tobacco: Never   Vaping Use   Vaping status: Never Used  Substance and Sexual Activity   Alcohol use: Not Currently   Drug use: Never   Sexual activity: Not Currently  Other Topics Concern   Not on file  Social History Narrative   Not on file   Social Drivers of Health   Financial Resource Strain: Low Risk  (03/15/2022)   Overall Financial Resource Strain (CARDIA)    Difficulty of Paying Living Expenses: Not hard at all  Food Insecurity: No Food Insecurity (03/15/2022)   Hunger Vital Sign    Worried About Running Out of Food in the Last Year: Never true    Ran Out of Food in the Last Year: Never true  Transportation Needs: No Transportation Needs (03/15/2022)   PRAPARE - Administrator, Civil Service (Medical): No    Lack of Transportation (Non-Medical): No  Physical Activity: Sufficiently Active (03/15/2022)   Exercise Vital Sign    Days of Exercise per Week: 7 days    Minutes of Exercise per Session: 30 min  Stress: Stress Concern Present (03/15/2022)   [employer] of Occupational Health - Occupational Stress Questionnaire    Feeling of Stress : To some extent  Social Connections: Moderately Integrated (03/15/2022)   Social Connection and Isolation Panel [NHANES]    Frequency of Communication with Friends and Family: More than three times a week    Frequency of Social Gatherings with Friends and Family: More  than three times a week    Attends Religious Services: More than 4 times per year    Active Member of Clubs or Organizations: No    Attends Banker Meetings: Never    Marital Status: Married  Catering Manager Violence: Not At Risk (03/15/2022)   Humiliation, Afraid, Rape, and Kick questionnaire    Fear of Current or Ex-Partner: No    Emotionally Abused: No    Physically Abused: No    Sexually Abused: No    Outpatient Medications Prior to Visit  Medication Sig Dispense Refill   ALPRAZolam  (XANAX ) 0.5 MG tablet Take 0.25 mg by mouth daily as needed for  anxiety.     aspirin  81 MG chewable tablet Chew 81 mg by mouth in the morning.     atorvastatin  (LIPITOR) 40 MG tablet Take 1 tablet (40 mg total) by mouth daily. 90 tablet 3   Blood Glucose Monitoring Suppl (ACCU-CHEK AVIVA PLUS) w/Device KIT by Does not apply route.     cholecalciferol (VITAMIN D ) 25 MCG (1000 UNIT) tablet Take 2,000 Units by mouth in the morning.     co-enzyme Q-10 30 MG capsule Take 30 mg by mouth 3 (three) times daily.     Continuous Glucose Receiver (DEXCOM G7 RECEIVER) DEVI 1 Device by Does not apply route continuous. 1 each 0   Continuous Glucose Sensor (DEXCOM G7 SENSOR) MISC APPLY 1 EVERY 10 DAYS 3 each 3   empagliflozin  (JARDIANCE ) 10 MG TABS tablet Take 1 tablet (10 mg total) by mouth daily before breakfast. 30 tablet 2   EPINEPHrine  0.3 mg/0.3 mL IJ SOAJ injection epinephrine  0.3 mg/0.3 mL injection, auto-injector (Patient taking differently: Inject 0.3 mg into the muscle as needed for anaphylaxis.) 1 each 1   glucose blood (ACCU-CHEK AVIVA PLUS) test strip TEST SUGAR 3-4 TIMES A DAY     Insulin  Pen Needle (SURE COMFORT PEN NEEDLES) 31G X 5 MM MISC Sure Comfort Pen Needle 31 gauge x 5/16 30 each 0   JARDIANCE  25 MG TABS tablet Take 1 tablet (25 mg total) by mouth daily before breakfast. 30 tablet 0   levothyroxine  (SYNTHROID ) 75 MCG tablet Take 1 tablet (75 mcg total) by mouth daily before breakfast. 90 tablet 1   mirabegron  ER (MYRBETRIQ ) 25 MG TB24 tablet Take 1 tablet (25 mg total) by mouth daily. 30 tablet 11   omeprazole  (PRILOSEC) 20 MG capsule TAKE ONE CAPSULE BY MOUTH ONCE DAILY. 90 capsule 3   ondansetron  (ZOFRAN ) 4 MG tablet Take 1 tablet (4 mg total) by mouth every 8 (eight) hours as needed. for nausea 20 tablet 0   rOPINIRole  (REQUIP ) 1 MG tablet Take 1 tablet (1 mg total) by mouth 3 (three) times daily. 90 tablet 2   sertraline  (ZOLOFT ) 25 MG tablet Take 1 tablet (25 mg total) by mouth daily. 90 tablet 3   TRESIBA  FLEXTOUCH 200 UNIT/ML FlexTouch Pen  INJECT 10 UNITS INTO THE SKIN TWICE DAILY. 9 mL 0   valACYclovir  (VALTREX ) 500 MG tablet Take 1 tablet (500 mg total) by mouth daily. 90 tablet 3   No facility-administered medications prior to visit.    Allergies  Allergen Reactions   Codeine Rash    Other reaction(s): Chest Pain   Gabapentin Other (See Comments)    Other reaction(s): Other Depression, suicidal thoughts   Sulfa Antibiotics Rash and Other (See Comments)    chest Pain   Atorvastatin  Calcium  Other (See Comments)    Other reaction(s): Other myalgia   Escitalopram  Other (See Comments)    Other reaction(s): Other Headaches   Ezetimibe Other (See Comments)    Other reaction(s): Other Myalgia-patient wants to retry generic 10/17   Fluvastatin Other (See Comments)    Other reaction(s): Other myalgia   Fosamax [Alendronate]     Other reaction(s): Other Leg pain   Levofloxacin Other (See Comments)    Other reaction(s): Other Muscle aching in her shoulders, inner thighs and legs   Pramipexole Nausea Only and Other (See Comments)    Headache    Prednisone Other (See Comments)    Unknown reaction type   Rosuvastatin Calcium  Other (See Comments)    Other reaction(s): Other myalgia   Simvastatin Other (See Comments)    Other reaction(s): Other myalgia   Sulfur  Rash   Trazodone And Nefazodone Nausea Only    Review of Systems  Constitutional:  Negative for chills and fever.  HENT:  Positive for congestion, postnasal drip, sinus pressure and sinus pain.   Eyes:  Negative for visual disturbance.  Respiratory:  Positive for cough. Negative for chest tightness and shortness of breath.   Neurological:  Negative for dizziness and headaches.       Objective:    Physical Exam HENT:     Head: Normocephalic.     Nose:     Right Sinus: Maxillary sinus tenderness present.     Left Sinus: Maxillary sinus tenderness present.     Mouth/Throat:     Mouth: Mucous membranes are moist.     Tongue: No lesions. Tongue  does not deviate from midline.     Pharynx: No oropharyngeal exudate or uvula swelling.  Cardiovascular:     Rate and Rhythm: Normal rate.     Heart sounds: Normal heart sounds.  Pulmonary:     Effort: Pulmonary effort is normal.     Breath sounds: Normal breath sounds.  Neurological:     Mental Status: She is alert.     Ht 5' 0.05 (1.525 m)   BMI 20.09 kg/m  Wt Readings from Last 3 Encounters:  12/20/22 103 lb 1 oz (46.7 kg)  09/13/22 104 lb 6.4 oz (47.4 kg)  07/13/22 107 lb 9.6 oz (48.8 kg)       Assessment & Plan:  Subacute maxillary sinusitis Assessment & Plan: Start taking Augmentin  875/125 mg BID for 5 days for sinusitis. Start taking Promethazine  DM 5 mL by mouth every 4 hours as needed for cough and cold symptoms. Increase fluid intake and allow for plenty of rest. Take Tylenol  as needed for pain, fever, or general discomfort. Perform warm saltwater gargles 3-4 times daily to help with throat pain or discomfort. (Mix 1/2 teaspoon of salt in a glass of warm water  and gargle several times daily to reduce throat inflammation and soothe irritation.) Look for sugar-free or honey-based throat lozenges to help with a sore throat. Use a humidifier at bedtime to help with cough and nasal congestion. For nasal congestion: The use of heated, humidified air is a safe and effective therapy. Saline nasal sprays may also help alleviate nasal symptoms of the common cold. For cough: Treatment with honey may reduce cough frequency and severity. Follow up if your symptoms do not improve after 7 to 10 days from symptom onset.  Orders: -     Amoxicillin -Pot Clavulanate; Take 1 tablet by mouth 2 (two) times daily for 5 days.  Dispense: 10 tablet; Refill: 0 -     Promethazine -DM; Take 5 mLs by mouth 4 (four) times daily as  needed.  Dispense: 118 mL; Refill: 0  Note: This chart has been completed using Engineer, Civil (consulting) software, and while attempts have been made to ensure accuracy,  certain words and phrases may not be transcribed as intended.    Kattleya Kuhnert, FNP

## 2023-03-04 NOTE — Patient Instructions (Addendum)
 I appreciate the opportunity to provide care to you today!    Follow up:  as needed with pcp  Sinusitis:  Start taking Augmentin  875/125 mg BID for 5 days for sinusitis. Start taking Promethazine  DM 5 mL by mouth every 4 hours as needed for cough and cold symptoms. Increase fluid intake and allow for plenty of rest. Take Tylenol  as needed for pain, fever, or general discomfort. Perform warm saltwater gargles 3-4 times daily to help with throat pain or discomfort. (Mix 1/2 teaspoon of salt in a glass of warm water  and gargle several times daily to reduce throat inflammation and soothe irritation.) Look for sugar-free or honey-based throat lozenges to help with a sore throat. Use a humidifier at bedtime to help with cough and nasal congestion. For nasal congestion: The use of heated, humidified air is a safe and effective therapy. Saline nasal sprays may also help alleviate nasal symptoms of the common cold. For cough: Treatment with honey may reduce cough frequency and severity. Follow up if your symptoms do not improve after 7 to 10 days from symptom onset.  Please continue to a heart-healthy diet and increase your physical activities. Try to exercise for at least five days a week.    It was a pleasure to see you and I look forward to continuing to work together on your health and well-being. Please do not hesitate to call the office if you need care or have questions about your care.  In case of emergency, please visit the Emergency Department for urgent care, or contact our clinic at [phone] to schedule an appointment. We're here to help you!   Have a wonderful day and week. With Gratitude, Evelyn Aguinaldo MSN, FNP-BC

## 2023-03-04 NOTE — Assessment & Plan Note (Signed)
 Start taking Augmentin  875/125 mg BID for 5 days for sinusitis. Start taking Promethazine  DM 5 mL by mouth every 4 hours as needed for cough and cold symptoms. Increase fluid intake and allow for plenty of rest. Take Tylenol  as needed for pain, fever, or general discomfort. Perform warm saltwater gargles 3-4 times daily to help with throat pain or discomfort. (Mix 1/2 teaspoon of salt in a glass of warm water  and gargle several times daily to reduce throat inflammation and soothe irritation.) Look for sugar-free or honey-based throat lozenges to help with a sore throat. Use a humidifier at bedtime to help with cough and nasal congestion. For nasal congestion: The use of heated, humidified air is a safe and effective therapy. Saline nasal sprays may also help alleviate nasal symptoms of the common cold. For cough: Treatment with honey may reduce cough frequency and severity. Follow up if your symptoms do not improve after 7 to 10 days from symptom onset.

## 2023-03-04 NOTE — Telephone Encounter (Signed)
 Copied from CRM [id number]. Topic: Clinical - Red Word Triage >> Mar 04, 2023  8:31 AM Powell HERO wrote: Patient is experiencing sinus issues and severe headaches   Chief Complaint: Sinus pain and congestion Symptoms: Headache, congestion, coughing up yellow and green phlegm, sore throat Frequency: Ongoing for 2 weeks Pertinent Negatives: Patient denies fever Disposition: [] ED /[] Urgent Care (no appt availability in office) / [x] Appointment(In office/virtual)/ []  [facility]/ [] Home Care/ [] Refused Recommended Disposition /[] [facility]/ []  Follow-up with PCP Additional Notes: Patient reported sinus pain and congestion ongoing for 2 weeks. She has tried Tylenol  without much relief. Same day appointment scheduled.   Reason for Disposition  Earache  Answer Assessment - Initial Assessment Questions 1. LOCATION: Where does it hurt?      Head, back of neck  2. ONSET: When did the sinus pain start?  (e.g., hours, days)      2 weeks ago 3. SEVERITY: How bad is the pain?   (Scale 1-10; mild, moderate or severe)   - MILD (1-3): doesn't interfere with normal activities    - MODERATE (4-7): interferes with normal activities (e.g., work or school) or awakens from sleep   - SEVERE (8-10): excruciating pain and patient unable to do any normal activities        8/10  4. NASAL CONGESTION: Is the nose blocked? If Yes, ask: Can you open it or must you breathe through your mouth?     Yes, stuffed nose makes it a little hard to breathe   5. FEVER: Do you have a fever? If Yes, ask: What is it, how was it measured, and when did it start?      No  6. OTHER SYMPTOMS: Do you have any other symptoms? (e.g., sore throat, cough, earache, difficulty breathing)     Earache, sore throat sometimes  Protocols used: Sinus Pain or Congestion-A-AH

## 2023-03-08 ENCOUNTER — Telehealth: Payer: Self-pay | Admitting: Internal Medicine

## 2023-03-08 DIAGNOSIS — J0100 Acute maxillary sinusitis, unspecified: Secondary | ICD-10-CM

## 2023-03-08 MED ORDER — AMOXICILLIN-POT CLAVULANATE 875-125 MG PO TABS: 1.0000 | ORAL_TABLET | Freq: Two times a day (BID) | ORAL | 0 refills | Status: AC

## 2023-03-08 NOTE — Telephone Encounter (Signed)
Copied from CRM [id number]. Topic: Clinical - Medication Question >> Mar 08, 2023  8:16 AM Carlatta H wrote: Reason for CRM: Patient would like a call back from the nurse//She still has congestion and drainage//She will be done taking medication today//

## 2023-03-08 NOTE — Telephone Encounter (Signed)
Patient advised.

## 2023-03-18 ENCOUNTER — Other Ambulatory Visit: Payer: Self-pay | Admitting: Internal Medicine

## 2023-03-19 ENCOUNTER — Ambulatory Visit: Payer: Medicare Other

## 2023-03-20 ENCOUNTER — Encounter: Payer: Self-pay | Admitting: Internal Medicine

## 2023-03-20 DIAGNOSIS — H43393 Other vitreous opacities, bilateral: Secondary | ICD-10-CM | POA: Diagnosis not present

## 2023-03-20 DIAGNOSIS — E119 Type 2 diabetes mellitus without complications: Secondary | ICD-10-CM | POA: Diagnosis not present

## 2023-03-20 DIAGNOSIS — H02834 Dermatochalasis of left upper eyelid: Secondary | ICD-10-CM | POA: Diagnosis not present

## 2023-03-20 DIAGNOSIS — H0102B Squamous blepharitis left eye, upper and lower eyelids: Secondary | ICD-10-CM | POA: Diagnosis not present

## 2023-03-20 DIAGNOSIS — H0102A Squamous blepharitis right eye, upper and lower eyelids: Secondary | ICD-10-CM | POA: Diagnosis not present

## 2023-03-20 DIAGNOSIS — H02831 Dermatochalasis of right upper eyelid: Secondary | ICD-10-CM | POA: Diagnosis not present

## 2023-03-20 DIAGNOSIS — H04123 Dry eye syndrome of bilateral lacrimal glands: Secondary | ICD-10-CM | POA: Diagnosis not present

## 2023-03-20 LAB — HM DIABETES EYE EXAM

## 2023-03-21 ENCOUNTER — Ambulatory Visit (INDEPENDENT_AMBULATORY_CARE_PROVIDER_SITE_OTHER): Payer: Medicare Other | Admitting: Internal Medicine

## 2023-03-21 ENCOUNTER — Encounter: Payer: Self-pay | Admitting: Internal Medicine

## 2023-03-21 VITALS — BP 108/65 | HR 87 | Ht 61.0 in | Wt 105.6 lb

## 2023-03-21 DIAGNOSIS — E1169 Type 2 diabetes mellitus with other specified complication: Secondary | ICD-10-CM | POA: Diagnosis not present

## 2023-03-21 DIAGNOSIS — E119 Type 2 diabetes mellitus without complications: Secondary | ICD-10-CM | POA: Diagnosis not present

## 2023-03-21 DIAGNOSIS — M1611 Unilateral primary osteoarthritis, right hip: Secondary | ICD-10-CM | POA: Diagnosis not present

## 2023-03-21 DIAGNOSIS — Z794 Long term (current) use of insulin: Secondary | ICD-10-CM | POA: Diagnosis not present

## 2023-03-21 DIAGNOSIS — E039 Hypothyroidism, unspecified: Secondary | ICD-10-CM | POA: Diagnosis not present

## 2023-03-21 DIAGNOSIS — F419 Anxiety disorder, unspecified: Secondary | ICD-10-CM | POA: Diagnosis not present

## 2023-03-21 DIAGNOSIS — F32A Depression, unspecified: Secondary | ICD-10-CM | POA: Diagnosis not present

## 2023-03-21 DIAGNOSIS — K219 Gastro-esophageal reflux disease without esophagitis: Secondary | ICD-10-CM | POA: Diagnosis not present

## 2023-03-21 DIAGNOSIS — E785 Hyperlipidemia, unspecified: Secondary | ICD-10-CM

## 2023-03-21 DIAGNOSIS — E559 Vitamin D deficiency, unspecified: Secondary | ICD-10-CM | POA: Diagnosis not present

## 2023-03-21 MED ORDER — TRESIBA FLEXTOUCH 200 UNIT/ML ~~LOC~~ SOPN: 16.0000 [IU] | PEN_INJECTOR | Freq: Every morning | SUBCUTANEOUS | 3 refills | Status: DC

## 2023-03-21 MED ORDER — SERTRALINE HCL 25 MG PO TABS: 25.0000 mg | ORAL_TABLET | Freq: Every day | ORAL | 3 refills | Status: DC

## 2023-03-21 MED ORDER — EMPAGLIFLOZIN 25 MG PO TABS: 25.0000 mg | ORAL_TABLET | Freq: Every day | ORAL | 3 refills | Status: DC

## 2023-03-21 NOTE — Progress Notes (Signed)
Established Patient Office Visit  Subjective   Patient ID: Karen Horn, female    DOB: 20-Apr-1945  Age: 78 y.o. MRN: [id number]  Chief Complaint  Patient presents with   Diabetes    Three month follow up    Karen Horn returns to care today for follow-up.  Karen Horn was last evaluated by me in October 2024.  Mounjaro was discontinued at that time due to concerns for weight loss and persistent nausea.  Jardiance was added.  67-month follow-up was arranged for diabetes management.  In the interim, Karen Horn presented to [facility] for an acute visit on 1/13 endorsing sinus congestion.  Treated with Augmentin for sinusitis.  There have otherwise been no acute interval events.  Karen Horn reports feeling well today.  Karen Horn is asymptomatic and has no additional concerns to discuss.  Karen Horn will undergo right hip THA next month (2/25) and needs labs updated.  Past Medical History:  Diagnosis Date   Diabetes mellitus without complication (HCC)    Hypothyroidism    Thyroid disease    Past Surgical History:  Procedure Laterality Date   ABDOMINAL HYSTERECTOMY     ADENOIDECTOMY     BACK SURGERY     BIOPSY  09/06/2021   Procedure: BIOPSY;  Surgeon: Marguerita Merles, Reuel Boom, MD;  [facility];  Service: Gastroenterology;;   ESOPHAGOGASTRODUODENOSCOPY (EGD) WITH PROPOFOL N/A 09/06/2021   Procedure: ESOPHAGOGASTRODUODENOSCOPY (EGD) WITH PROPOFOL;  Surgeon: Dolores Frame, MD;  [facility];  Service: Gastroenterology;  Laterality: N/A;  215 ASA 2   KNEE ARTHROSCOPY WITH MEDIAL MENISECTOMY Left 02/27/2022   Procedure: KNEE ARTHROSCOPY WITH MEDIAL MENISCECTOMY;  Surgeon: Vickki Hearing, MD;  Location: [facility];  Service: Orthopedics;  Laterality: Left;   SINOSCOPY     TONSILLECTOMY     Social History   Tobacco Use   Smoking status: Never   Smokeless tobacco: Never  Vaping Use   Vaping status: Never Used  Substance Use Topics   Alcohol use: Not Currently   Drug use: Never    History reviewed. No pertinent family history. Allergies  Allergen Reactions   Codeine Rash    Other reaction(s): Chest Pain   Gabapentin Other (See Comments)    Other reaction(s): Other Depression, suicidal thoughts   Sulfa Antibiotics Rash and Other (See Comments)    chest Pain   Atorvastatin Calcium Other (See Comments)    Other reaction(s): Other myalgia   Escitalopram Other (See Comments)    Other reaction(s): Other Headaches   Ezetimibe Other (See Comments)    Other reaction(s): Other Myalgia-patient wants to retry generic 10/17   Fluvastatin Other (See Comments)    Other reaction(s): Other myalgia   Fosamax [Alendronate]     Other reaction(s): Other Leg pain   Levofloxacin Other (See Comments)    Other reaction(s): Other Muscle aching in her shoulders, inner thighs and legs   Pramipexole Nausea Only and Other (See Comments)    Headache    Prednisone Other (See Comments)    Unknown reaction type   Rosuvastatin Calcium Other (See Comments)    Other reaction(s): Other myalgia   Simvastatin Other (See Comments)    Other reaction(s): Other myalgia   Sulfur Rash   Trazodone And Nefazodone Nausea Only   Review of Systems  Constitutional:  Negative for chills and fever.  HENT:  Negative for sore throat.   Respiratory:  Negative for cough and shortness of breath.   Cardiovascular:  Negative for chest pain, palpitations and leg  swelling.  Gastrointestinal:  Negative for abdominal pain, blood in stool, constipation, diarrhea, nausea and vomiting.  Genitourinary:  Negative for dysuria and hematuria.  Musculoskeletal:  Negative for myalgias.  Skin:  Negative for itching and rash.  Neurological:  Negative for dizziness and headaches.  Psychiatric/Behavioral:  Negative for depression and suicidal ideas.      Objective:     BP 108/65 (BP Location: Right Arm, Patient Position: Sitting, Cuff Size: Normal)   Pulse 87   Ht 5\' 1"  (1.549 m)   Wt 105 lb 9.6 oz (47.9  kg)   SpO2 95%   BMI 19.95 kg/m  BP Readings from Last 3 Encounters:  03/21/23 108/65  12/20/22 112/62  10/30/22 117/65   Physical Exam Vitals reviewed.  Constitutional:      General: Karen Horn is not in acute distress.    Appearance: Normal appearance. Karen Horn is not toxic-appearing.  HENT:     Head: Normocephalic and atraumatic.     Right Ear: External ear normal.     Left Ear: External ear normal.     Nose: Nose normal. No congestion or rhinorrhea.     Mouth/Throat:     Mouth: Mucous membranes are moist.     Pharynx: Oropharynx is clear. No oropharyngeal exudate or posterior oropharyngeal erythema.  Eyes:     General: No scleral icterus.    Extraocular Movements: Extraocular movements intact.     Conjunctiva/sclera: Conjunctivae normal.     Pupils: Pupils are equal, round, and reactive to light.  Cardiovascular:     Rate and Rhythm: Normal rate and regular rhythm.     Pulses: Normal pulses.     Heart sounds: Normal heart sounds. No murmur heard.    No friction rub. No gallop.  Pulmonary:     Effort: Pulmonary effort is normal.     Breath sounds: Normal breath sounds. No wheezing, rhonchi or rales.  Abdominal:     General: Abdomen is flat. Bowel sounds are normal. There is no distension.     Palpations: Abdomen is soft.     Tenderness: There is no abdominal tenderness.  Musculoskeletal:        General: No swelling. Normal range of motion.     Cervical back: Normal range of motion.     Right lower leg: No edema.     Left lower leg: No edema.  Lymphadenopathy:     Cervical: No cervical adenopathy.  Skin:    General: Skin is warm and dry.     Capillary Refill: Capillary refill takes less than 2 seconds.     Coloration: Skin is not jaundiced.  Neurological:     General: No focal deficit present.     Mental Status: Karen Horn is alert and oriented to person, place, and time.  Psychiatric:        Mood and Affect: Mood normal.        Behavior: Behavior normal.   Last CBC Lab  Results  Component Value Date   WBC 6.4 02/23/2022   HGB 13.6 02/23/2022   HCT 42.4 02/23/2022   MCV 90.6 02/23/2022   MCH 29.1 02/23/2022   RDW 13.6 02/23/2022   PLT 228 02/23/2022   Last metabolic panel Lab Results  Component Value Date   GLUCOSE 218 (H) 02/23/2022   NA 134 (L) 02/23/2022   K 4.0 02/23/2022   CL 104 02/23/2022   CO2 21 (L) 02/23/2022   BUN 14 02/23/2022   CREATININE 0.70 02/23/2022   GFRNONAA >60 02/23/2022  CALCIUM 9.3 02/23/2022   PROT 6.7 01/15/2022   ALBUMIN 4.3 01/15/2022   LABGLOB 2.4 01/15/2022   AGRATIO 1.8 01/15/2022   BILITOT 0.3 01/15/2022   ALKPHOS 47 01/15/2022   AST 17 01/15/2022   ALT 18 01/15/2022   ANIONGAP 9 02/23/2022   Last lipids Lab Results  Component Value Date   CHOL 159 09/10/2022   HDL 55 09/10/2022   LDLCALC 90 09/10/2022   TRIG 75 09/10/2022   CHOLHDL 2.9 09/10/2022   Last hemoglobin A1c Lab Results  Component Value Date   HGBA1C 7.1 (H) 12/11/2022   Last thyroid functions Lab Results  Component Value Date   TSH 1.590 01/15/2022   Last vitamin D Lab Results  Component Value Date   VD25OH 31.5 01/15/2022   Last vitamin B12 and Folate Lab Results  Component Value Date   VITAMINB12 397 01/15/2022   FOLATE 10.6 01/15/2022   The 10-year ASCVD risk score (Arnett DK, et al., 2019) is: 23.6%    Assessment & Plan:   Problem List Items Addressed This Visit       Type 2 diabetes mellitus (HCC) - Primary   A1c 7.1 on labs from October 2024.  Greggory Keen was discontinued that time due to concern for weight loss and persistent nausea.  Jardiance was added.  Today Karen Horn reports taking Tresiba 20 units every morning and Jardiance 25 mg daily.  Karen Horn endorses overnight hypoglycemia with readings dropping to 60.  Karen Horn periodically has readings exceeding 200 in the afternoon. -Repeat A1c and urine microalbumin/creatinine ratio ordered today -Given overnight hypoglycemia, reduce Tresiba to 16 units every morning.  Continue  Jardiance as currently prescribed.      Osteoarthritis of right hip   Karen Horn is scheduled to undergo right hip THA with Dr. Lequita Halt on 04/16/2023.  Surgical clearance form previously completed.  Will update labs and ECG today.        Return in about 3 months (around 06/19/2023).    Billie Lade, MD

## 2023-03-21 NOTE — Patient Instructions (Signed)
It was a pleasure to see you today.  Thank you for giving [place] the opportunity to be involved in your care.  Below is a brief recap of your visit and next steps.  We will plan to see you again in 3 months.  Summary Reduce Tresiba to 16 units every morning Jardiance and sertraline refilled Repeat labs and update ECG Follow up in 3 months

## 2023-03-21 NOTE — Assessment & Plan Note (Signed)
She is scheduled to undergo right hip THA with Dr. Lequita Halt on 04/16/2023.  Surgical clearance form previously completed.  Will update labs and ECG today.

## 2023-03-21 NOTE — Assessment & Plan Note (Signed)
A1c 7.1 on labs from October 2024.  Karen Horn was discontinued that time due to concern for weight loss and persistent nausea.  Jardiance was added.  Today she reports taking Tresiba 20 units every morning and Jardiance 25 mg daily.  She endorses overnight hypoglycemia with readings dropping to 60.  She periodically has readings exceeding 200 in the afternoon. -Repeat A1c and urine microalbumin/creatinine ratio ordered today -Given overnight hypoglycemia, reduce Tresiba to 16 units every morning.  Continue Jardiance as currently prescribed.

## 2023-03-22 ENCOUNTER — Encounter: Payer: Self-pay | Admitting: Internal Medicine

## 2023-03-23 LAB — CMP14+EGFR
ALT: 23 [IU]/L (ref 0–32)
AST: 26 [IU]/L (ref 0–40)
Albumin: 4.4 g/dL (ref 3.8–4.8)
Alkaline Phosphatase: 41 [IU]/L — ABNORMAL LOW (ref 44–121)
BUN/Creatinine Ratio: 32 — ABNORMAL HIGH (ref 12–28)
BUN: 25 mg/dL (ref 8–27)
Bilirubin Total: 0.4 mg/dL (ref 0.0–1.2)
CO2: 22 mmol/L (ref 20–29)
Calcium: 10 mg/dL (ref 8.7–10.3)
Chloride: 105 mmol/L (ref 96–106)
Creatinine, Ser: 0.79 mg/dL (ref 0.57–1.00)
Globulin, Total: 2.2 g/dL (ref 1.5–4.5)
Glucose: 123 mg/dL — ABNORMAL HIGH (ref 70–99)
Potassium: 4.3 mmol/L (ref 3.5–5.2)
Sodium: 142 mmol/L (ref 134–144)
Total Protein: 6.6 g/dL (ref 6.0–8.5)
eGFR: 77 mL/min/{1.73_m2} (ref >59)

## 2023-03-23 LAB — LIPID PANEL
Chol/HDL Ratio: 2.8 {ratio} (ref 0.0–4.4)
Cholesterol, Total: 160 mg/dL (ref 100–199)
HDL: 58 mg/dL (ref >39)
LDL Chol Calc (NIH): 79 mg/dL (ref 0–99)
Triglycerides: 135 mg/dL (ref 0–149)
VLDL Cholesterol Cal: 23 mg/dL (ref 5–40)

## 2023-03-23 LAB — MICROALBUMIN / CREATININE URINE RATIO
Creatinine, Urine: 82.3 mg/dL
Microalb/Creat Ratio: 9 mg/g{creat} (ref 0–29)
Microalbumin, Urine: 7 ug/mL

## 2023-03-23 LAB — VITAMIN D 25 HYDROXY (VIT D DEFICIENCY, FRACTURES): Vit D, 25-Hydroxy: 35.3 ng/mL (ref 30.0–100.0)

## 2023-03-23 LAB — CBC WITH DIFFERENTIAL/PLATELET
Basophils Absolute: 0 10*3/uL (ref 0.0–0.2)
Basos: 1 %
EOS (ABSOLUTE): 0.1 10*3/uL (ref 0.0–0.4)
Eos: 2 %
Hematocrit: 45.9 % (ref 34.0–46.6)
Hemoglobin: 14.3 g/dL (ref 11.1–15.9)
Immature Grans (Abs): 0 10*3/uL (ref 0.0–0.1)
Immature Granulocytes: 0 %
Lymphocytes Absolute: 2.2 10*3/uL (ref 0.7–3.1)
Lymphs: 36 %
MCH: 29.1 pg (ref 26.6–33.0)
MCHC: 31.2 g/dL — ABNORMAL LOW (ref 31.5–35.7)
MCV: 94 fL (ref 79–97)
Monocytes Absolute: 0.4 10*3/uL (ref 0.1–0.9)
Monocytes: 6 %
Neutrophils Absolute: 3.4 10*3/uL (ref 1.4–7.0)
Neutrophils: 55 %
Platelets: 236 10*3/uL (ref 150–450)
RBC: 4.91 x10E6/uL (ref 3.77–5.28)
RDW: 13.4 % (ref 11.7–15.4)
WBC: 6.1 10*3/uL (ref 3.4–10.8)

## 2023-03-23 LAB — B12 AND FOLATE PANEL
Folate: 14.2 ng/mL (ref >3.0)
Vitamin B-12: 362 pg/mL (ref 232–1245)

## 2023-03-23 LAB — TSH+FREE T4
Free T4: 1.65 ng/dL (ref 0.82–1.77)
TSH: 2.09 u[IU]/mL (ref 0.450–4.500)

## 2023-03-23 LAB — HEMOGLOBIN A1C
Est. average glucose Bld gHb Est-mCnc: 174 mg/dL
Hgb A1c MFr Bld: 7.7 % — ABNORMAL HIGH (ref 4.8–5.6)

## 2023-03-24 ENCOUNTER — Encounter: Payer: Self-pay | Admitting: Internal Medicine

## 2023-04-01 ENCOUNTER — Other Ambulatory Visit: Payer: Self-pay | Admitting: Emergency Medicine

## 2023-04-05 ENCOUNTER — Encounter: Payer: Self-pay | Admitting: Internal Medicine

## 2023-04-05 ENCOUNTER — Ambulatory Visit (INDEPENDENT_AMBULATORY_CARE_PROVIDER_SITE_OTHER): Payer: Medicare Other | Admitting: Internal Medicine

## 2023-04-05 VITALS — BP 112/65 | HR 73 | Resp 96 | Ht 61.0 in | Wt 103.8 lb

## 2023-04-05 DIAGNOSIS — M1611 Unilateral primary osteoarthritis, right hip: Secondary | ICD-10-CM | POA: Diagnosis not present

## 2023-04-05 NOTE — Assessment & Plan Note (Addendum)
Return to care today for surgical clearance.  She is scheduled undergo right hip THA with Dr. Lequita Halt on 04/16/23.  Labs and ECG were updated at follow-up last month.  Surgical forms completed today.  Deemed an elevated but acceptable surgical risk.  She will return to care for routine follow-up as previously scheduled in early May.

## 2023-04-05 NOTE — Progress Notes (Signed)
Established Patient Office Visit  Subjective   Patient ID: Karen Horn, female    DOB: 1945/04/05  Age: 78 y.o. MRN: [id number]  Chief Complaint  Patient presents with   surgical clearance    Surgical Clearance for Right hip    Karen Horn returns to care today for surgical clearance.  She has a known history of osteoarthritis of the right hip and will undergo arthroplasty with Dr. Lequita Halt on 04/16/2023.  Recently seen by me for routine follow-up on 1/30.  Labs and ECG were updated.  She reports feeling well today and has no additional concerns to discuss.  Past Medical History:  Diagnosis Date   Diabetes mellitus without complication (HCC)    Hypothyroidism    Thyroid disease    Past Surgical History:  Procedure Laterality Date   ABDOMINAL HYSTERECTOMY     ADENOIDECTOMY     BACK SURGERY     BIOPSY  09/06/2021   Procedure: BIOPSY;  Surgeon: Marguerita Merles, Reuel Boom, MD;  Location: [facility];  Service: Gastroenterology;;   ESOPHAGOGASTRODUODENOSCOPY (EGD) WITH PROPOFOL N/A 09/06/2021   Procedure: ESOPHAGOGASTRODUODENOSCOPY (EGD) WITH PROPOFOL;  Surgeon: Dolores Frame, MD;  Location: [facility];  Service: Gastroenterology;  Laterality: N/A;  215 ASA 2   KNEE ARTHROSCOPY WITH MEDIAL MENISECTOMY Left 02/27/2022   Procedure: KNEE ARTHROSCOPY WITH MEDIAL MENISCECTOMY;  Surgeon: Vickki Hearing, MD;  Location: [facility];  Service: Orthopedics;  Laterality: Left;   SINOSCOPY     TONSILLECTOMY     Social History   Tobacco Use   Smoking status: Never   Smokeless tobacco: Never  Vaping Use   Vaping status: Never Used  Substance Use Topics   Alcohol use: Not Currently   Drug use: Never   History reviewed. No pertinent family history. Allergies  Allergen Reactions   Codeine Rash    Other reaction(s): Chest Pain   Gabapentin Other (See Comments)    Other reaction(s): Other Depression, suicidal thoughts   Sulfa Antibiotics Rash and Other (See Comments)     chest Pain   Atorvastatin Calcium Other (See Comments)    Other reaction(s): Other myalgia   Escitalopram Other (See Comments)    Other reaction(s): Other Headaches   Ezetimibe Other (See Comments)    Other reaction(s): Other Myalgia-patient wants to retry generic 10/17   Fluvastatin Other (See Comments)    Other reaction(s): Other myalgia   Fosamax [Alendronate]     Other reaction(s): Other Leg pain   Levofloxacin Other (See Comments)    Other reaction(s): Other Muscle aching in her shoulders, inner thighs and legs   Pramipexole Nausea Only and Other (See Comments)    Headache    Prednisone Other (See Comments)    Unknown reaction type   Rosuvastatin Calcium Other (See Comments)    Other reaction(s): Other myalgia   Simvastatin Other (See Comments)    Other reaction(s): Other myalgia   Sulfur Rash   Trazodone And Nefazodone Nausea Only   Review of Systems  Constitutional:  Negative for chills and fever.  HENT:  Negative for sore throat.   Respiratory:  Negative for cough and shortness of breath.   Cardiovascular:  Negative for chest pain, palpitations and leg swelling.  Gastrointestinal:  Negative for abdominal pain, blood in stool, constipation, diarrhea, nausea and vomiting.  Genitourinary:  Negative for dysuria and hematuria.  Musculoskeletal:  Negative for myalgias.  Skin:  Negative for itching and rash.  Neurological:  Negative for dizziness and headaches.  Psychiatric/Behavioral:  Negative for depression and suicidal ideas.      Objective:     BP 112/65 (BP Location: Left Arm, Patient Position: Sitting, Cuff Size: Normal)   Pulse 73   Resp (!) 96   Ht 5\' 1"  (1.549 m)   Wt 103 lb 12.8 oz (47.1 kg)   BMI 19.61 kg/m  BP Readings from Last 3 Encounters:  04/05/23 112/65  03/21/23 108/65  12/20/22 112/62   Physical Exam Vitals reviewed.  Constitutional:      General: She is not in acute distress.    Appearance: Normal appearance. She is not  toxic-appearing.  HENT:     Head: Normocephalic and atraumatic.     Right Ear: External ear normal.     Left Ear: External ear normal.     Nose: Nose normal. No congestion or rhinorrhea.     Mouth/Throat:     Mouth: Mucous membranes are moist.     Pharynx: Oropharynx is clear. No oropharyngeal exudate or posterior oropharyngeal erythema.  Eyes:     General: No scleral icterus.    Extraocular Movements: Extraocular movements intact.     Conjunctiva/sclera: Conjunctivae normal.     Pupils: Pupils are equal, round, and reactive to light.  Cardiovascular:     Rate and Rhythm: Normal rate and regular rhythm.     Pulses: Normal pulses.     Heart sounds: Normal heart sounds. No murmur heard.    No friction rub. No gallop.  Pulmonary:     Effort: Pulmonary effort is normal.     Breath sounds: Normal breath sounds. No wheezing, rhonchi or rales.  Abdominal:     General: Abdomen is flat. Bowel sounds are normal. There is no distension.     Palpations: Abdomen is soft.     Tenderness: There is no abdominal tenderness.  Musculoskeletal:        General: No swelling. Normal range of motion.     Cervical back: Normal range of motion.     Right lower leg: No edema.     Left lower leg: No edema.  Lymphadenopathy:     Cervical: No cervical adenopathy.  Skin:    General: Skin is warm and dry.     Capillary Refill: Capillary refill takes less than 2 seconds.     Coloration: Skin is not jaundiced.  Neurological:     General: No focal deficit present.     Mental Status: She is alert and oriented to person, place, and time.  Psychiatric:        Mood and Affect: Mood normal.        Behavior: Behavior normal.   Last CBC Lab Results  Component Value Date   WBC 6.1 03/21/2023   HGB 14.3 03/21/2023   HCT 45.9 03/21/2023   MCV 94 03/21/2023   MCH 29.1 03/21/2023   RDW 13.4 03/21/2023   PLT 236 03/21/2023   Last metabolic panel Lab Results  Component Value Date   GLUCOSE 123 (H)  03/21/2023   NA 142 03/21/2023   K 4.3 03/21/2023   CL 105 03/21/2023   CO2 22 03/21/2023   BUN 25 03/21/2023   CREATININE 0.79 03/21/2023   EGFR 77 03/21/2023   CALCIUM 10.0 03/21/2023   PROT 6.6 03/21/2023   ALBUMIN 4.4 03/21/2023   LABGLOB 2.2 03/21/2023   AGRATIO 1.8 01/15/2022   BILITOT 0.4 03/21/2023   ALKPHOS 41 (L) 03/21/2023   AST 26 03/21/2023   ALT 23 03/21/2023   ANIONGAP 9 02/23/2022  Last lipids Lab Results  Component Value Date   CHOL 160 03/21/2023   HDL 58 03/21/2023   LDLCALC 79 03/21/2023   TRIG 135 03/21/2023   CHOLHDL 2.8 03/21/2023   Last hemoglobin A1c Lab Results  Component Value Date   HGBA1C 7.7 (H) 03/21/2023   Last thyroid functions Lab Results  Component Value Date   TSH 2.090 03/21/2023   Last vitamin D Lab Results  Component Value Date   VD25OH 35.3 03/21/2023   Last vitamin B12 and Folate Lab Results  Component Value Date   VITAMINB12 362 03/21/2023   FOLATE 14.2 03/21/2023   The 10-year ASCVD risk score (Arnett DK, et al., 2019) is: 25.2%    Assessment & Plan:   Problem List Items Addressed This Visit       Osteoarthritis of right hip - Primary   Return to care today for surgical clearance.  She is scheduled undergo right hip THA with Dr. Lequita Halt on 04/16/23.  Labs and ECG were updated at follow-up last month.  Surgical forms completed today.  Deemed an elevated but acceptable surgical risk.  She will return to care for routine follow-up as previously scheduled in early May.        Return if symptoms worsen or fail to improve.    Billie Lade, MD

## 2023-04-05 NOTE — Patient Instructions (Signed)
It was a pleasure to see you today.  Thank you for giving [place] the opportunity to be involved in your care.  Below is a brief recap of your visit and next steps.  We will plan to see you again on 06/20/23.  Summary Good luck with surgery later this month. Please let me know if anything else is needed. Follow up scheduled in May

## 2023-04-08 ENCOUNTER — Other Ambulatory Visit: Payer: Self-pay | Admitting: Emergency Medicine

## 2023-04-12 ENCOUNTER — Other Ambulatory Visit: Payer: Self-pay

## 2023-04-15 ENCOUNTER — Other Ambulatory Visit: Payer: Self-pay

## 2023-04-15 ENCOUNTER — Telehealth: Payer: Self-pay

## 2023-04-15 ENCOUNTER — Encounter: Payer: Self-pay | Admitting: Internal Medicine

## 2023-04-15 DIAGNOSIS — M810 Age-related osteoporosis without current pathological fracture: Secondary | ICD-10-CM | POA: Insufficient documentation

## 2023-04-15 NOTE — Telephone Encounter (Signed)
 Auth Submission: APPROVED Site of care: Site of care: [facility] Payer: [facility] Medication & CPT/J Code(s) submitted: Prolia (Denosumab) [id number] Route of submission (phone, fax, portal): portal Phone # Fax # Auth type: Buy/Bill PB Units/visits requested: 60mg , x2 doses Reference number: [id number] Approval from: 04/15/23 to 04/14/24

## 2023-04-16 DIAGNOSIS — M25751 Osteophyte, right hip: Secondary | ICD-10-CM | POA: Diagnosis not present

## 2023-04-16 DIAGNOSIS — M1611 Unilateral primary osteoarthritis, right hip: Secondary | ICD-10-CM | POA: Diagnosis not present

## 2023-04-25 ENCOUNTER — Other Ambulatory Visit: Payer: Self-pay | Admitting: Internal Medicine

## 2023-04-25 DIAGNOSIS — E039 Hypothyroidism, unspecified: Secondary | ICD-10-CM

## 2023-04-28 ENCOUNTER — Other Ambulatory Visit: Payer: Self-pay | Admitting: Internal Medicine

## 2023-04-28 DIAGNOSIS — E119 Type 2 diabetes mellitus without complications: Secondary | ICD-10-CM

## 2023-04-30 ENCOUNTER — Ambulatory Visit: Payer: Medicare Other

## 2023-05-07 ENCOUNTER — Ambulatory Visit: Payer: Medicare Other

## 2023-05-23 DIAGNOSIS — Z5189 Encounter for other specified aftercare: Secondary | ICD-10-CM | POA: Diagnosis not present

## 2023-05-28 ENCOUNTER — Telehealth: Payer: Self-pay | Admitting: Internal Medicine

## 2023-05-28 NOTE — Telephone Encounter (Signed)
 Medical clearance form for left total hip arthroplasty.  Copied Noted Sleeve (put in provider box) just had clearance done on right hip visit on  02.14.2025  Fax to dr frank aluisio [phone]

## 2023-05-29 NOTE — Telephone Encounter (Signed)
 Form completed and faxed with confirmation Copy sent to [employer]

## 2023-05-30 ENCOUNTER — Other Ambulatory Visit: Payer: Self-pay | Admitting: Internal Medicine

## 2023-05-30 DIAGNOSIS — E785 Hyperlipidemia, unspecified: Secondary | ICD-10-CM

## 2023-06-20 ENCOUNTER — Encounter: Payer: Self-pay | Admitting: Internal Medicine

## 2023-06-20 ENCOUNTER — Ambulatory Visit: Payer: Medicare Other | Admitting: Internal Medicine

## 2023-06-20 VITALS — BP 107/67 | HR 91 | Ht 60.0 in | Wt 102.6 lb

## 2023-06-20 DIAGNOSIS — E785 Hyperlipidemia, unspecified: Secondary | ICD-10-CM

## 2023-06-20 DIAGNOSIS — E1169 Type 2 diabetes mellitus with other specified complication: Secondary | ICD-10-CM

## 2023-06-20 DIAGNOSIS — G2581 Restless legs syndrome: Secondary | ICD-10-CM

## 2023-06-20 DIAGNOSIS — Z794 Long term (current) use of insulin: Secondary | ICD-10-CM | POA: Diagnosis not present

## 2023-06-20 DIAGNOSIS — F419 Anxiety disorder, unspecified: Secondary | ICD-10-CM | POA: Diagnosis not present

## 2023-06-20 DIAGNOSIS — F32A Depression, unspecified: Secondary | ICD-10-CM | POA: Diagnosis not present

## 2023-06-20 DIAGNOSIS — E119 Type 2 diabetes mellitus without complications: Secondary | ICD-10-CM

## 2023-06-20 DIAGNOSIS — R251 Tremor, unspecified: Secondary | ICD-10-CM | POA: Insufficient documentation

## 2023-06-20 MED ORDER — SERTRALINE HCL 25 MG PO TABS: 25.0000 mg | ORAL_TABLET | Freq: Every day | ORAL | 3 refills | Status: AC

## 2023-06-20 NOTE — Assessment & Plan Note (Signed)
 Previously well-controlled with sertraline  25 mg daily, however she has not been taking sertraline  recently due to concern for potential adverse interaction with cyclobenzaprine.  She is now off cyclobenzaprine and plans to resume sertraline .  Refill provided today.

## 2023-06-20 NOTE — Assessment & Plan Note (Signed)
 Her additional concern today is a tremor that has developed within the last 2 months.  It is noticeable when attempting to put on her eye make-up.  It does not otherwise interfere with activities of daily living.  She believes it is due to stopping sertraline , which she plans to resume.  History and exam findings today are concerning for essential tremor.  He is not interested in starting medication currently and will follow-up if symptoms start to negatively impact her quality of life.

## 2023-06-20 NOTE — Assessment & Plan Note (Signed)
 She endorses worsening symptoms recently.  She is currently prescribed ropinirole  1 mg 3 times daily as needed.  She typically takes 1 mg at night.  Recommend adding an additional dose in the afternoon.

## 2023-06-20 NOTE — Patient Instructions (Signed)
 It was a pleasure to see you today.  Thank you for giving us  the opportunity to be involved in your care.  Below is a brief recap of your visit and next steps.  We will plan to see you again in 3 months.  Summary No medication changes today Sertraline  refilled Follow up in 3 months

## 2023-06-20 NOTE — Progress Notes (Signed)
 Established Patient Office Visit  Subjective   Patient ID: Karen Horn, female    DOB: June 10, 1945  Age: 78 y.o. MRN: [id number]  Chief Complaint  Patient presents with   Care Management    Three month follow up    Shaking    Patient states she notices she is really shaking in the morning    restless legs    She is having restless legs during the day    Karen Horn returns to care today for routine follow-up.  She was last evaluated by me on 2/14 requesting surgical clearance for right hip arthroplasty.  Previously seen by me for routine follow-up on 1/30.  Tresiba  was reduced to 16 units nightly in the setting of overnight hypoglycemia.  In the interim, she underwent right hip arthroplasty on 2/25.  Her postoperative course has been uncomplicated and she plans to undergo a left hip arthroplasty in July.  Today she reports feeling fairly well.  She has noted a tremor when attempting to put on my make-up in the mornings.  She also endorses worsening RLS symptoms.  She is taking ropinirole  1 mg nightly.  Past Medical History:  Diagnosis Date   Diabetes mellitus without complication (HCC)    Hypothyroidism    Thyroid  disease    Past Surgical History:  Procedure Laterality Date   ABDOMINAL HYSTERECTOMY     ADENOIDECTOMY     BACK SURGERY     BIOPSY  09/06/2021   Procedure: BIOPSY;  Surgeon: Urban Garden, MD;  Location: [facility];  Service: Gastroenterology;;   ESOPHAGOGASTRODUODENOSCOPY (EGD) WITH PROPOFOL  N/A 09/06/2021   Procedure: ESOPHAGOGASTRODUODENOSCOPY (EGD) WITH PROPOFOL ;  Surgeon: Urban Garden, MD;  Location: [facility];  Service: Gastroenterology;  Laterality: N/A;  215 ASA 2   KNEE ARTHROSCOPY WITH MEDIAL MENISECTOMY Left 02/27/2022   Procedure: KNEE ARTHROSCOPY WITH MEDIAL MENISCECTOMY;  Surgeon: Darrin Emerald, MD;  Location: [facility];  Service: Orthopedics;  Laterality: Left;   SINOSCOPY     TONSILLECTOMY     Social History    Tobacco Use   Smoking status: Never   Smokeless tobacco: Never  Vaping Use   Vaping status: Never Used  Substance Use Topics   Alcohol use: Not Currently   Drug use: Never   History reviewed. No pertinent family history. Allergies  Allergen Reactions   Codeine Rash    Other reaction(s): Chest Pain   Gabapentin Other (See Comments)    Other reaction(s): Other Depression, suicidal thoughts   Sulfa Antibiotics Rash and Other (See Comments)    chest Pain   Atorvastatin  Calcium  Other (See Comments)    Other reaction(s): Other myalgia   Escitalopram Other (See Comments)    Other reaction(s): Other Headaches   Ezetimibe Other (See Comments)    Other reaction(s): Other Myalgia-patient wants to retry generic 10/17   Fluvastatin Other (See Comments)    Other reaction(s): Other myalgia   Fosamax [Alendronate]     Other reaction(s): Other Leg pain   Levofloxacin Other (See Comments)    Other reaction(s): Other Muscle aching in her shoulders, inner thighs and legs   Pramipexole Nausea Only and Other (See Comments)    Headache    Prednisone Other (See Comments)    Unknown reaction type   Rosuvastatin Calcium  Other (See Comments)    Other reaction(s): Other myalgia   Simvastatin Other (See Comments)    Other reaction(s): Other myalgia   Sulfur  Rash   Trazodone And Nefazodone Nausea Only  Review of Systems  Constitutional:  Negative for chills and fever.  HENT:  Negative for sore throat.   Respiratory:  Negative for cough and shortness of breath.   Cardiovascular:  Negative for chest pain, palpitations and leg swelling.  Gastrointestinal:  Negative for abdominal pain, blood in stool, constipation, diarrhea, nausea and vomiting.  Genitourinary:  Negative for dysuria and hematuria.  Musculoskeletal:  Negative for myalgias.  Skin:  Negative for itching and rash.  Neurological:  Positive for tremors. Negative for dizziness and headaches.       RLS   Psychiatric/Behavioral:  Negative for depression and suicidal ideas.      Objective:     BP 107/67   Pulse 91   Ht 5' (1.524 m)   Wt 102 lb 9.6 oz (46.5 kg)   SpO2 96%   BMI 20.04 kg/m  BP Readings from Last 3 Encounters:  06/20/23 107/67  04/05/23 112/65  03/21/23 108/65   Physical Exam Vitals reviewed.  Constitutional:      General: She is not in acute distress.    Appearance: Normal appearance. She is not toxic-appearing.  HENT:     Head: Normocephalic and atraumatic.     Right Ear: External ear normal.     Left Ear: External ear normal.     Nose: Nose normal. No congestion or rhinorrhea.     Mouth/Throat:     Mouth: Mucous membranes are moist.     Pharynx: Oropharynx is clear. No oropharyngeal exudate or posterior oropharyngeal erythema.  Eyes:     General: No scleral icterus.    Extraocular Movements: Extraocular movements intact.     Conjunctiva/sclera: Conjunctivae normal.     Pupils: Pupils are equal, round, and reactive to light.  Cardiovascular:     Rate and Rhythm: Normal rate and regular rhythm.     Pulses: Normal pulses.     Heart sounds: Normal heart sounds. No murmur heard.    No friction rub. No gallop.  Pulmonary:     Effort: Pulmonary effort is normal.     Breath sounds: Normal breath sounds. No wheezing, rhonchi or rales.  Abdominal:     General: Abdomen is flat. Bowel sounds are normal. There is no distension.     Palpations: Abdomen is soft.     Tenderness: There is no abdominal tenderness.  Musculoskeletal:        General: No swelling. Normal range of motion.     Cervical back: Normal range of motion.     Right lower leg: No edema.     Left lower leg: No edema.  Lymphadenopathy:     Cervical: No cervical adenopathy.  Skin:    General: Skin is warm and dry.     Capillary Refill: Capillary refill takes less than 2 seconds.     Coloration: Skin is not jaundiced.  Neurological:     General: No focal deficit present.     Mental Status:  She is alert and oriented to person, place, and time.     Comments: Slight action tremor noted in both hands.  Psychiatric:        Mood and Affect: Mood normal.        Behavior: Behavior normal.   Last CBC Lab Results  Component Value Date   WBC 6.1 03/21/2023   HGB 14.3 03/21/2023   HCT 45.9 03/21/2023   MCV 94 03/21/2023   MCH 29.1 03/21/2023   RDW 13.4 03/21/2023   PLT 236 03/21/2023   Last metabolic  panel Lab Results  Component Value Date   GLUCOSE 123 (H) 03/21/2023   NA 142 03/21/2023   K 4.3 03/21/2023   CL 105 03/21/2023   CO2 22 03/21/2023   BUN 25 03/21/2023   CREATININE 0.79 03/21/2023   EGFR 77 03/21/2023   CALCIUM  10.0 03/21/2023   PROT 6.6 03/21/2023   ALBUMIN 4.4 03/21/2023   LABGLOB 2.2 03/21/2023   AGRATIO 1.8 01/15/2022   BILITOT 0.4 03/21/2023   ALKPHOS 41 (L) 03/21/2023   AST 26 03/21/2023   ALT 23 03/21/2023   ANIONGAP 9 02/23/2022   Last lipids Lab Results  Component Value Date   CHOL 160 03/21/2023   HDL 58 03/21/2023   LDLCALC 79 03/21/2023   TRIG 135 03/21/2023   CHOLHDL 2.8 03/21/2023   Last hemoglobin A1c Lab Results  Component Value Date   HGBA1C 7.7 (H) 03/21/2023   Last thyroid  functions Lab Results  Component Value Date   TSH 2.090 03/21/2023   Last vitamin D  Lab Results  Component Value Date   VD25OH 35.3 03/21/2023   Last vitamin B12 and Folate Lab Results  Component Value Date   VITAMINB12 362 03/21/2023   FOLATE 14.2 03/21/2023   The 10-year ASCVD risk score (Arnett DK, et al., 2019) is: 23.3%    Assessment & Plan:   Problem List Items Addressed This Visit       Type 2 diabetes mellitus (HCC) - Primary   A1c 7.7 on labs from January.  Tresiba  was reduced to 16 units nightly in the setting of overnight hypoglycemia.  She is additionally prescribed Jardiance  25 mg daily.  Mounjaro  previously discontinued due to weight loss.  She has also focused on dietary changes in an effort to improve diabetes control.   Home glucometer readings were reviewed today and are largely within goal.  No medication changes are indicated at this time.  Repeat A1c at follow-up in 3 months.      Hyperlipidemia associated with type 2 diabetes mellitus (HCC)   Currently prescribed atorvastatin  40 mg daily.  Lipid panel updated in January reflects adequate control on this regimen.      Restless legs   She endorses worsening symptoms recently.  She is currently prescribed ropinirole  1 mg 3 times daily as needed.  She typically takes 1 mg at night.  Recommend adding an additional dose in the afternoon.      Anxiety and depression   Previously well-controlled with sertraline  25 mg daily, however she has not been taking sertraline  recently due to concern for potential adverse interaction with cyclobenzaprine.  She is now off cyclobenzaprine and plans to resume sertraline .  Refill provided today.      Tremor   Her additional concern today is a tremor that has developed within the last 2 months.  It is noticeable when attempting to put on her eye make-up.  It does not otherwise interfere with activities of daily living.  She believes it is due to stopping sertraline , which she plans to resume.  History and exam findings today are concerning for essential tremor.  He is not interested in starting medication currently and will follow-up if symptoms start to negatively impact her quality of life.      Return in about 3 months (around 09/20/2023).   Tobi Fortes, MD

## 2023-06-20 NOTE — Assessment & Plan Note (Signed)
 Currently prescribed atorvastatin  40 mg daily.  Lipid panel updated in January reflects adequate control on this regimen.

## 2023-06-20 NOTE — Assessment & Plan Note (Signed)
 A1c 7.7 on labs from January.  Tresiba  was reduced to 16 units nightly in the setting of overnight hypoglycemia.  She is additionally prescribed Jardiance  25 mg daily.  Mounjaro  previously discontinued due to weight loss.  She has also focused on dietary changes in an effort to improve diabetes control.  Home glucometer readings were reviewed today and are largely within goal.  No medication changes are indicated at this time.  Repeat A1c at follow-up in 3 months.

## 2023-07-04 NOTE — Telephone Encounter (Signed)
 Pt called and needs a second clearance for surgery faxed over to Dr Liliane Rei, MD for left hip replacement. Fax number : [phone]. Please advise.

## 2023-07-08 NOTE — Telephone Encounter (Signed)
Refaxed via Epic.

## 2023-07-09 ENCOUNTER — Telehealth: Payer: Self-pay

## 2023-07-09 NOTE — Telephone Encounter (Signed)
 Copied from CRM [id number]. Topic: General - Other >> Jul 08, 2023  4:24 PM Karen Horn wrote: Reason for CRM: Pt states surgery clearance was faxed over for her second hip replacement surgery. Pt req Dr. Kermit Ped fill out asap and send back. If any issues please reach out to pt [phone]

## 2023-07-10 NOTE — Telephone Encounter (Signed)
 Faxed on 5/19 via Epic Faxed on 5/21 via e-mail with confirmation

## 2023-07-11 NOTE — Telephone Encounter (Signed)
 New form completed and faxed on 5/22 with confirmation Copy sent to scan

## 2023-07-23 ENCOUNTER — Telehealth: Payer: Self-pay | Admitting: Internal Medicine

## 2023-07-23 DIAGNOSIS — Z794 Long term (current) use of insulin: Secondary | ICD-10-CM

## 2023-07-23 NOTE — Telephone Encounter (Signed)
 Copied from CRM [id number]. Topic: Clinical - Request for Lab/Test Order >> Jul 23, 2023  1:11 PM Alysia Jumbo S wrote: Reason for CRM: Patient is requesting a lab order for A1C. Patient is requesting a callback to confirm scheduling.

## 2023-07-24 ENCOUNTER — Encounter: Payer: Self-pay | Admitting: Internal Medicine

## 2023-07-24 DIAGNOSIS — Z01812 Encounter for preprocedural laboratory examination: Secondary | ICD-10-CM | POA: Diagnosis not present

## 2023-07-24 DIAGNOSIS — M1991 Primary osteoarthritis, unspecified site: Secondary | ICD-10-CM | POA: Diagnosis not present

## 2023-07-24 NOTE — Telephone Encounter (Signed)
 Patient advised.

## 2023-07-25 NOTE — Telephone Encounter (Signed)
 Awaiting results

## 2023-08-05 ENCOUNTER — Encounter: Payer: Self-pay | Admitting: Internal Medicine

## 2023-08-05 ENCOUNTER — Telehealth: Payer: Self-pay

## 2023-08-05 NOTE — Telephone Encounter (Signed)
 Copied from CRM [id number]. Topic: Clinical - Lab/Test Results >> Jul 29, 2023  3:24 PM Marissa P wrote: Reason for CRM: Patient called to see if theres an update on her lab results, no update yet and that she would like to go with [name]

## 2023-08-05 NOTE — Telephone Encounter (Signed)
 Copied from CRM [id number]. Topic: Clinical - Lab/Test Results >> Jul 29, 2023  3:24 PM Karen Horn wrote: Reason for CRM: Patient called to see if theres an update on her lab results, no update yet and that she would like to go with [name]

## 2023-08-05 NOTE — Telephone Encounter (Signed)
 Lab results hasn't been read yet.

## 2023-08-06 ENCOUNTER — Ambulatory Visit: Payer: Self-pay

## 2023-08-06 DIAGNOSIS — M7071 Other bursitis of hip, right hip: Secondary | ICD-10-CM | POA: Diagnosis not present

## 2023-08-06 NOTE — Telephone Encounter (Signed)
 No triage: Pt called wanting to know if A1C results are back. RN  informed results are not released yet. Pt is waiting to schedule surgery on this result. Please advise.

## 2023-08-07 ENCOUNTER — Telehealth: Payer: Self-pay

## 2023-08-07 NOTE — Telephone Encounter (Signed)
 Pt states was givin her lab results through another appointment but wants PCP to interpret them for her

## 2023-08-07 NOTE — Telephone Encounter (Signed)
 Copied from CRM [id number]. Topic: General - Other >> Aug 07, 2023 10:01 AM Essie A wrote: Reason for CRM: Patient is calling again regarding lab results for A1C.  Please return her call to let her know the status at [phone].

## 2023-08-07 NOTE — Telephone Encounter (Signed)
 Patient was advised to come back and have A1c redrawn

## 2023-08-14 NOTE — Telephone Encounter (Signed)
 Patient was made aware to walk in for A1c draw. Has follow up appointment with NP on 8/01

## 2023-08-20 DIAGNOSIS — M1612 Unilateral primary osteoarthritis, left hip: Secondary | ICD-10-CM | POA: Diagnosis not present

## 2023-08-20 DIAGNOSIS — M25752 Osteophyte, left hip: Secondary | ICD-10-CM | POA: Diagnosis not present

## 2023-08-25 ENCOUNTER — Other Ambulatory Visit: Payer: Self-pay | Admitting: Internal Medicine

## 2023-08-25 DIAGNOSIS — G2581 Restless legs syndrome: Secondary | ICD-10-CM

## 2023-09-20 ENCOUNTER — Ambulatory Visit

## 2023-09-20 VITALS — BP 136/75 | HR 91 | Ht 60.0 in | Wt 100.1 lb

## 2023-09-20 DIAGNOSIS — Z794 Long term (current) use of insulin: Secondary | ICD-10-CM

## 2023-09-20 DIAGNOSIS — E119 Type 2 diabetes mellitus without complications: Secondary | ICD-10-CM

## 2023-09-20 MED ORDER — PIOGLITAZONE HCL 15 MG PO TABS: 15.0000 mg | ORAL_TABLET | Freq: Every day | ORAL | 3 refills | Status: DC

## 2023-09-20 NOTE — Progress Notes (Signed)
 Established Patient Office Visit  Subjective   Patient ID: Karen Horn, female    DOB: [birth year]  Age: 78 y.o. MRN: [id number]  Chief Complaint  Patient presents with   Medical Management of Chronic Issues    3 month follow up    HPI  Patient Active Problem List   Diagnosis Date Noted   Tremor 06/20/2023   OP (osteoporosis) 04/15/2023   Osteoarthritis of right hip 03/21/2023   Subacute maxillary sinusitis 03/04/2023   Urge incontinence 09/13/2022   Hyperlipidemia associated with type 2 diabetes mellitus (HCC) 01/17/2022   Anxiety and depression 01/17/2022   Stress incontinence, female 01/17/2022   HSV (herpes simplex virus) infection 01/17/2022   Dysphagia 12/14/2021   Throat clearing 12/14/2021   Reactive arthropathy of hip (HCC) 10/12/2021   Localized osteoporosis without pathological fracture 06/19/2021   Menopausal and postmenopausal disorder 09/29/2020   Vitamin D  deficiency 09/29/2020   GERD (gastroesophageal reflux disease) 09/20/2020   Hypothyroidism 09/20/2020   Hyperglycemia due to type 2 diabetes mellitus (HCC) 08/03/2020   Seasonal and perennial allergic rhinitis 01/10/2020   History of lumbar surgery 10/01/2019   TMJ syndrome 03/19/2018   Restless legs 07/10/2016   Iron deficiency anemia 06/27/2016   Osteopenia of multiple sites 06/22/2015   Type 2 diabetes mellitus (HCC) 08/26/2014   DDD (degenerative disc disease), lumbosacral 07/08/2012   Statin intolerance 10/04/2011   Breast calcification, left 09/22/2010   Mitral regurgitation 09/22/2010      ROS    Objective:     BP 136/75   Pulse 91   Ht 5' (1.524 m)   Wt 100 lb 1.9 oz (45.4 kg)   SpO2 96%   BMI 19.55 kg/m  BP Readings from Last 3 Encounters:  09/20/23 136/75  06/20/23 107/67  04/05/23 112/65   Wt Readings from Last 3 Encounters:  09/20/23 100 lb 1.9 oz (45.4 kg)  06/20/23 102 lb 9.6 oz (46.5 kg)  04/05/23 103 lb 12.8 oz (47.1 kg)     Physical Exam Vitals and  nursing note reviewed.  Constitutional:      Appearance: Normal appearance.  HENT:     Head: Normocephalic.  Cardiovascular:     Rate and Rhythm: Normal rate and regular rhythm.  Pulmonary:     Effort: Pulmonary effort is normal.     Breath sounds: Normal breath sounds.  Musculoskeletal:     Cervical back: Normal range of motion and neck supple.  Neurological:     Mental Status: She is alert and oriented to person, place, and time.     Gait: Gait abnormal (walking with cane d/t recent hip replacement on left side).  Psychiatric:        Mood and Affect: Mood normal.        Thought Content: Thought content normal.      Results for orders placed or performed in visit on 09/20/23  Bayer DCA Hb A1c Waived  Result Value Ref Range   HB A1C (BAYER DCA - WAIVED) 6.8 (H) 4.8 - 5.6 %   E Last CBC Lab Results  Component Value Date   WBC 6.1 03/21/2023   HGB 14.3 03/21/2023   HCT 45.9 03/21/2023   MCV 94 03/21/2023   MCH 29.1 03/21/2023   RDW 13.4 03/21/2023   PLT 236 03/21/2023   Last metabolic panel Lab Results  Component Value Date   GLUCOSE 123 (H) 03/21/2023   NA 142 03/21/2023   K 4.3 03/21/2023   CL 105 03/21/2023  CO2 22 03/21/2023   BUN 25 03/21/2023   CREATININE 0.79 03/21/2023   EGFR 77 03/21/2023   CALCIUM  10.0 03/21/2023   PROT 6.6 03/21/2023   ALBUMIN 4.4 03/21/2023   LABGLOB 2.2 03/21/2023   AGRATIO 1.8 01/15/2022   BILITOT 0.4 03/21/2023   ALKPHOS 41 (L) 03/21/2023   AST 26 03/21/2023   ALT 23 03/21/2023   ANIONGAP 9 02/23/2022   Last lipids Lab Results  Component Value Date   CHOL 160 03/21/2023   HDL 58 03/21/2023   LDLCALC 79 03/21/2023   TRIG 135 03/21/2023   CHOLHDL 2.8 03/21/2023   Last hemoglobin A1c Lab Results  Component Value Date   HGBA1C 6.8 (H) 09/20/2023   Last thyroid  functions Lab Results  Component Value Date   TSH 2.090 03/21/2023      The 10-year ASCVD risk score (Arnett DK, et al., 2019) is: 34.5%     Assessment & Plan:   Problem List Items Addressed This Visit       Endocrine   Type 2 diabetes mellitus (HCC) - Primary   Your A1c in office today was 6.8.  Agreed to stop Jardiance  and add pioglitazone .  She does not feel that the Jardiance  helps with control of blood sugars.  Continue with current dose of Tresiba .  Follow-up in 3 months or sooner if blood sugars are getting above 200 or below 80.      Relevant Medications   pioglitazone  (ACTOS ) 15 MG tablet   Other Relevant Orders   Bayer DCA Hb A1c Waived (Completed)    Return in about 3 months (around 12/21/2023) for chronic follow-up with PCP.    Leita Longs, FNP

## 2023-09-23 ENCOUNTER — Ambulatory Visit: Payer: Self-pay

## 2023-09-23 ENCOUNTER — Other Ambulatory Visit: Payer: Self-pay

## 2023-09-23 ENCOUNTER — Other Ambulatory Visit: Payer: Self-pay | Admitting: Internal Medicine

## 2023-09-23 DIAGNOSIS — E119 Type 2 diabetes mellitus without complications: Secondary | ICD-10-CM

## 2023-09-23 LAB — BAYER DCA HB A1C WAIVED: HB A1C (BAYER DCA - WAIVED): 6.8 % — ABNORMAL HIGH (ref 4.8–5.6)

## 2023-09-23 MED ORDER — DEXCOM G7 SENSOR MISC: 1.0000 | 3 refills | Status: DC

## 2023-09-23 NOTE — Telephone Encounter (Signed)
 Copied from CRM [id number]. Topic: Clinical - Medication Refill >> Sep 23, 2023 12:30 PM Berwyn MATSU wrote: Medication: Continuous Glucose Sensor (DEXCOM G7 SENSOR) MISC  Has the patient contacted their pharmacy? Yes (Agent: If no, request that the patient contact the pharmacy for the refill. If patient does not wish to contact the pharmacy document the reason why and proceed with request.) (Agent: If yes, when and what did the pharmacy advise?)  This is the patient's preferred pharmacy:  [facility] - [place], [place] - [address] [place] [address] Phone: [phone] Fax: [phone]    Is this the correct pharmacy for this prescription? Yes If no, delete pharmacy and type the correct one.   Has the prescription been filled recently? Yes  Is the patient out of the medication? Yes  Has the patient been seen for an appointment in the last year OR does the patient have an upcoming appointment? Yes  Can we respond through MyChart? Yes  Agent: Please be advised that Rx refills may take up to 3 business days. We ask that you follow-up with your pharmacy.

## 2023-09-23 NOTE — Telephone Encounter (Signed)
 FYI Only or Action Required?: Action required by provider: clinical question for provider and update on patient condition.  Patient was last seen in primary care on 09/20/2023 by Bevely Doffing, FNP.  Called Nurse Triage reporting Blood Sugar Problem.  Symptoms began several days ago.  Interventions attempted: Dietary changes.  Symptoms are: unchanged.  Triage Disposition: Home Care  Patient/caregiver understands and will follow disposition?: No, wishes to speak with PCP  Copied from CRM #[id number]. Topic: Clinical - Red Word Triage >> Sep 23, 2023 12:33 PM Karen Horn wrote: Red Word that prompted transfer to Nurse Triage: blood sugar greater than 250 earlier and now its just been flucating and has concerns. Reason for Disposition  Blood glucose 70-240 mg/dL (3.9 -86.6 mmol/L)  Answer Assessment - Initial Assessment Questions 1. BLOOD GLUCOSE: What is your blood glucose level?      This morning 126, now it is 210 has not had breakfast. Yesterday as high as 300 2. ONSET: When did you check the blood glucose?     Dexcom 3. USUAL RANGE: What is your glucose level usually? (e.g., usual fasting morning value, usual evening value)     85 in morning 4. KETONES: Do you check for ketones (urine or blood test strips)? If Yes, ask: What does the test show now?       5. TYPE 1 or 2:  Do you know what type of diabetes you have?  (e.g., Type 1, Type 2, Gestational; doesn't know)       6. INSULIN : Do you take insulin ? What type of insulin (s) do you use? What is the mode of delivery? (syringe, pen; injection or pump)?      As prescribed 7. DIABETES PILLS: Do you take any pills for your diabetes? If Yes, ask: Have you missed taking any pills recently?     Taking as prescribed  8. OTHER SYMPTOMS: Do you have any symptoms? (e.g., fever, frequent urination, difficulty breathing, dizziness, weakness, vomiting)     Denies all symptoms  Additional info:  1) Last Friday Jardiance  was  discontinued. New Actos . She is requesting from PCP if she needs to give new medication time to work or if she should be prescribed something else.  2) She is having issues with her MyChart, requesting CALL back from office not a message in MyChart. MyChart helpline phone number provided.  Protocols used: Diabetes - High Blood Sugar-A-AH

## 2023-09-24 ENCOUNTER — Telehealth: Payer: Self-pay

## 2023-09-24 ENCOUNTER — Ambulatory Visit: Payer: Self-pay

## 2023-09-24 NOTE — Telephone Encounter (Signed)
 Pt advised.

## 2023-09-24 NOTE — Assessment & Plan Note (Signed)
 Your A1c in office today was 6.8.  Agreed to stop Jardiance  and add pioglitazone .  She does not feel that the Jardiance  helps with control of blood sugars.  Continue with current dose of Tresiba .  Follow-up in 3 months or sooner if blood sugars are getting above 200 or below 80.

## 2023-09-24 NOTE — Telephone Encounter (Signed)
 Copied from CRM #[id number]. Topic: Clinical - Lab/Test Results >> Sep 23, 2023 12:28 PM Berwyn MATSU wrote: Reason for CRM: Patient called in requesting an update on labs that were drawn on Friday 09/20/23. Patient is requesting a call back.  May you please assist.

## 2023-09-30 ENCOUNTER — Ambulatory Visit: Payer: Self-pay

## 2023-09-30 NOTE — Telephone Encounter (Signed)
 FYI Only or Action Required?: FYI only for provider.  Patient was last seen in primary care on 09/20/2023 by Bevely Doffing, FNP.  Called Nurse Triage reporting Diabetes.  Symptoms began several days ago.  Interventions attempted: Prescription medications: Actos .  Symptoms are: unchanged.  Triage Disposition: See PCP When Office is Open (Within 3 Days)  Patient/caregiver understands and will follow disposition?: Yes      Copied from CRM #[id number]. Topic: Clinical - Medication Question >> Sep 30, 2023 12:50 PM Harlene ORN wrote: Reason for CRM: Patient was put on ACTOS  medication, but her blood sugar is still too high.  Please advise. Reason for Disposition  Blood glucose 70-240 mg/dL (3.9 -86.6 mmol/L)  Answer Assessment - Initial Assessment Questions 1. BLOOD GLUCOSE: What is your blood glucose level?      97 this AM upon waking-- reports would go up after breakfast 197 post breakfast 194 within last 30-40 mins 2. ONSET: When did you check the blood glucose?     Past few days 3. USUAL RANGE: What is your glucose level usually? (e.g., usual fasting morning value, usual evening value)     131-98 in AM before breakfast 4. KETONES: Do you check for ketones (urine or blood test strips)? If Yes, ask: What does the test show now?      denies 5. TYPE 1 or 2:  Do you know what type of diabetes you have?  (e.g., Type 1, Type 2, Gestational; doesn't know)      2 6. INSULIN : Do you take insulin ? What type of insulin (s) do you use? What is the mode of delivery? (syringe, pen; injection or pump)?      insulin  degludec (TRESIBA ) 7. DIABETES PILLS: Do you take any pills for your diabetes? If Yes, ask: Have you missed taking any pills recently?     pioglitazone  (ACTOS ) 15 MG tablet Reports was previously on Monjauro with + response/wt loss, then tried Jardiance  with + weight loss 8. OTHER SYMPTOMS: Do you have any symptoms? (e.g., fever, frequent urination, difficulty  breathing, dizziness, weakness, vomiting)     denies 9. PREGNANCY: Is there any chance you are pregnant? When was your last menstrual period?     N/a  Protocols used: Diabetes - High Blood Sugar-A-AH

## 2023-10-02 DIAGNOSIS — Z5189 Encounter for other specified aftercare: Secondary | ICD-10-CM | POA: Diagnosis not present

## 2023-10-03 ENCOUNTER — Ambulatory Visit: Payer: Self-pay

## 2023-10-09 ENCOUNTER — Ambulatory Visit (INDEPENDENT_AMBULATORY_CARE_PROVIDER_SITE_OTHER)

## 2023-10-09 VITALS — BP 124/76 | HR 87 | Ht 60.0 in | Wt 102.0 lb

## 2023-10-09 DIAGNOSIS — B009 Herpesviral infection, unspecified: Secondary | ICD-10-CM

## 2023-10-09 DIAGNOSIS — N3941 Urge incontinence: Secondary | ICD-10-CM

## 2023-10-09 DIAGNOSIS — N3946 Mixed incontinence: Secondary | ICD-10-CM | POA: Diagnosis not present

## 2023-10-09 DIAGNOSIS — E039 Hypothyroidism, unspecified: Secondary | ICD-10-CM | POA: Diagnosis not present

## 2023-10-09 DIAGNOSIS — R350 Frequency of micturition: Secondary | ICD-10-CM

## 2023-10-09 DIAGNOSIS — E119 Type 2 diabetes mellitus without complications: Secondary | ICD-10-CM | POA: Diagnosis not present

## 2023-10-09 DIAGNOSIS — R351 Nocturia: Secondary | ICD-10-CM

## 2023-10-09 DIAGNOSIS — R3915 Urgency of urination: Secondary | ICD-10-CM

## 2023-10-09 DIAGNOSIS — Z794 Long term (current) use of insulin: Secondary | ICD-10-CM | POA: Diagnosis not present

## 2023-10-09 MED ORDER — PIOGLITAZONE HCL 30 MG PO TABS: 30.0000 mg | ORAL_TABLET | Freq: Every day | ORAL | 2 refills | Status: DC

## 2023-10-09 MED ORDER — TRESIBA FLEXTOUCH 200 UNIT/ML ~~LOC~~ SOPN: 18.0000 [IU] | PEN_INJECTOR | Freq: Every morning | SUBCUTANEOUS | 3 refills | Status: DC

## 2023-10-09 MED ORDER — TRESIBA FLEXTOUCH 200 UNIT/ML ~~LOC~~ SOPN: 16.0000 [IU] | PEN_INJECTOR | Freq: Every morning | SUBCUTANEOUS | 3 refills | Status: DC

## 2023-10-09 MED ORDER — LEVOTHYROXINE SODIUM 75 MCG PO TABS: 75.0000 ug | ORAL_TABLET | Freq: Every day | ORAL | 3 refills | Status: AC

## 2023-10-09 MED ORDER — MIRABEGRON ER 25 MG PO TB24: 25.0000 mg | ORAL_TABLET | Freq: Every day | ORAL | 11 refills | Status: DC

## 2023-10-09 NOTE — Progress Notes (Signed)
 Established Patient Office Visit  Subjective   Patient ID: Karen Horn, female    DOB: 1945/03/24  Age: 78 y.o. MRN: [id number]  Chief Complaint  Patient presents with   Medical Management of Chronic Issues    Diabetes management     HPI  Discussed the use of AI scribe software for clinical note transcription with the patient, who gave verbal consent to proceed.  History of Present Illness   Karen Horn is a 78 year old female with diabetes who presents with concerns about high blood sugar levels.  Hyperglycemia and glycemic variability - Elevated blood glucose levels, particularly postprandially, often exceeding 200 mg/dL - Recent hypoglycemic episode with blood glucose dropping to 61 mg/dL yesterday morning - Takes Jardiance  and Actos  for diabetes management - Recently increased Jardiance  dose due to running out of Actos   Unintentional weight loss and muscle wasting - Significant weight loss from 115-116 pounds to 102 pounds - Attributes weight loss to Jardiance  and Mounjaro  - Concerned about loss of muscle tone, especially after two hip replacements  Bladder cancer risk concern - Mother had bladder cancer - Expresses concern about increased risk of bladder cancer associated with Actos   Overactive bladder symptoms - History of overactive bladder with urgency and occasional incontinence - Previously treated with Mirabegron , not currently taking this medication  Herpes simplex virus infection - History of herpes simplex virus infection - Takes daily antiviral medication - No recent outbreaks         ROS    Objective:     BP 124/76   Pulse 87   Ht 5' (1.524 m)   Wt 102 lb (46.3 kg)   SpO2 96%   BMI 19.92 kg/m  BP Readings from Last 3 Encounters:  10/09/23 124/76  09/20/23 136/75  06/20/23 107/67   Wt Readings from Last 3 Encounters:  10/09/23 102 lb (46.3 kg)  09/20/23 100 lb 1.9 oz (45.4 kg)  06/20/23 102 lb 9.6 oz (46.5 kg)       Physical Exam Vitals and nursing note reviewed.  Constitutional:      Appearance: Normal appearance.  HENT:     Head: Normocephalic.  Eyes:     Extraocular Movements: Extraocular movements intact.     Pupils: Pupils are equal, round, and reactive to light.  Cardiovascular:     Rate and Rhythm: Normal rate and regular rhythm.  Pulmonary:     Effort: Pulmonary effort is normal.     Breath sounds: Normal breath sounds.  Musculoskeletal:     Cervical back: Normal range of motion and neck supple.  Neurological:     Mental Status: She is alert and oriented to person, place, and time.  Psychiatric:        Mood and Affect: Mood normal.        Thought Content: Thought content normal.      No results found for any visits on 10/09/23.  Last CBC Lab Results  Component Value Date   WBC 6.1 03/21/2023   HGB 14.3 03/21/2023   HCT 45.9 03/21/2023   MCV 94 03/21/2023   MCH 29.1 03/21/2023   RDW 13.4 03/21/2023   PLT 236 03/21/2023   Last metabolic panel Lab Results  Component Value Date   GLUCOSE 123 (H) 03/21/2023   NA 142 03/21/2023   K 4.3 03/21/2023   CL 105 03/21/2023   CO2 22 03/21/2023   BUN 25 03/21/2023   CREATININE 0.79 03/21/2023   EGFR 77 03/21/2023   CALCIUM   10.0 03/21/2023   PROT 6.6 03/21/2023   ALBUMIN 4.4 03/21/2023   LABGLOB 2.2 03/21/2023   AGRATIO 1.8 01/15/2022   BILITOT 0.4 03/21/2023   ALKPHOS 41 (L) 03/21/2023   AST 26 03/21/2023   ALT 23 03/21/2023   ANIONGAP 9 02/23/2022   Last lipids Lab Results  Component Value Date   CHOL 160 03/21/2023   HDL 58 03/21/2023   LDLCALC 79 03/21/2023   TRIG 135 03/21/2023   CHOLHDL 2.8 03/21/2023   Last hemoglobin A1c Lab Results  Component Value Date   HGBA1C 6.8 (H) 09/20/2023   Last thyroid  functions Lab Results  Component Value Date   TSH 2.090 03/21/2023   Last vitamin D  Lab Results  Component Value Date   VD25OH 35.3 03/21/2023   Last vitamin B12 and Folate Lab Results  Component  Value Date   VITAMINB12 362 03/21/2023   FOLATE 14.2 03/21/2023      The 10-year ASCVD risk score (Arnett DK, et al., 2019) is: 33%    Assessment & Plan:   Problem List Items Addressed This Visit       Endocrine   Hypothyroidism   She endorses a history of hypothyroidism and is currently prescribed levothyroxine  75 mcg daily.  She is asymptomatic currently.  Levothyroxine  75 mcg refilled.        Relevant Medications   levothyroxine  (SYNTHROID ) 75 MCG tablet   Type 2 diabetes mellitus (HCC)   Hyperglycemia and glycemic variability - Elevated blood glucose levels, particularly postprandially, often exceeding 200 mg/dL - Recent hypoglycemic episode with blood glucose dropping to 61 mg/dL yesterday morning - Takes Jardiance  and Actos  for diabetes management - Recently increased Jardiance  dose due to running out of Actos   Unintentional weight loss and muscle wasting - Significant weight loss from 115-116 pounds to 102 pounds - Attributes weight loss to Jardiance  and Mounjaro  - Concerned about loss of muscle tone, especially after two hip replacements  Bladder cancer risk concern - Mother had bladder cancer - Expresses concern about increased risk of bladder cancer associated with Actos        Relevant Medications   pioglitazone  (ACTOS ) 30 MG tablet   mirabegron  ER (MYRBETRIQ ) 25 MG TB24 tablet   insulin  degludec (TRESIBA  FLEXTOUCH) 200 UNIT/ML FlexTouch Pen     Other   HSV (herpes simplex virus) infection - Primary   - No recent outbreaks Currently prescribed Valtrex  500 mg every morning.  No changes today.      Mixed stress and urge urinary incontinence   Overactive bladder symptoms - History of overactive bladder with urgency and occasional incontinence - Previously treated with Mirabegron , not currently taking this medication - Mybetriq refilled at today's visit.       Relevant Medications   mirabegron  ER (MYRBETRIQ ) 25 MG TB24 tablet    No follow-ups on  file.    Leita Longs, FNP

## 2023-10-11 ENCOUNTER — Encounter: Payer: Self-pay | Admitting: Radiology

## 2023-10-13 ENCOUNTER — Ambulatory Visit: Payer: Self-pay

## 2023-10-13 NOTE — Assessment & Plan Note (Addendum)
 She endorses a history of hypothyroidism and is currently prescribed levothyroxine  75 mcg daily.  She is asymptomatic currently.  Levothyroxine  75 mcg refilled.

## 2023-10-13 NOTE — Assessment & Plan Note (Signed)
 Hyperglycemia and glycemic variability - Elevated blood glucose levels, particularly postprandially, often exceeding 200 mg/dL - Recent hypoglycemic episode with blood glucose dropping to 61 mg/dL yesterday morning - Takes Jardiance  and Actos  for diabetes management - Recently increased Jardiance  dose due to running out of Actos   Unintentional weight loss and muscle wasting - Significant weight loss from 115-116 pounds to 102 pounds - Attributes weight loss to Jardiance  and Mounjaro  - Concerned about loss of muscle tone, especially after two hip replacements  Bladder cancer risk concern - Mother had bladder cancer - Expresses concern about increased risk of bladder cancer associated with Actos 

## 2023-10-13 NOTE — Assessment & Plan Note (Signed)
-   No recent outbreaks Currently prescribed Valtrex  500 mg every morning.  No changes today.

## 2023-10-13 NOTE — Assessment & Plan Note (Signed)
 Overactive bladder symptoms - History of overactive bladder with urgency and occasional incontinence - Previously treated with Mirabegron , not currently taking this medication - Mybetriq refilled at today's visit.

## 2023-10-24 ENCOUNTER — Ambulatory Visit (INDEPENDENT_AMBULATORY_CARE_PROVIDER_SITE_OTHER): Admitting: Orthopedic Surgery

## 2023-10-24 DIAGNOSIS — M7052 Other bursitis of knee, left knee: Secondary | ICD-10-CM | POA: Diagnosis not present

## 2023-10-24 MED ORDER — METHYLPREDNISOLONE ACETATE 40 MG/ML IJ SUSP
40.0000 mg | Freq: Once | INTRAMUSCULAR | Status: AC
  Administered 2023-10-24: 40 mg via INTRA_ARTICULAR

## 2023-10-24 NOTE — Progress Notes (Signed)
   Chief Complaint  Patient presents with   Knee Pain    Left     78 year old female status post bilateral total hip arthroplasties comes in with recurrent left knee pain.  The patient is status post left knee arthroscopy with partial medial meniscectomy February 27, 2022  She had some postoperative symptoms of pes bursitis and was treated with injection  She comes in with recurrent medial knee pain and a feeling like the knee will give way.  She says she has trouble letting the 2 knees touch and sleeps with a pillow in between her legs  On examination her left knee has no effusion full range of motion is stable to ligamentous laxity testing she is tender over the pes bursa and pes tendons but not at the joint line  We talked about possible injection and she agreed  She will also use topical medications and ice  Procedure note Left knee injection for bursitis   verbal consent was obtained to inject Left knee PES BURSA  Timeout was completed to confirm the site of injection  The medications used were 40 mg of Depo-Medrol  and 1% lidocaine  3 cc  Anesthesia was provided by ethyl chloride and the skin was prepped with alcohol.  After cleaning the skin with alcohol a 25-gauge needle was used to inject the left knee bursa   There were no complications and a sterile bandage was applied

## 2023-10-24 NOTE — Progress Notes (Signed)
   There were no vitals taken for this visit.  There is no height or weight on file to calculate BMI.  Chief Complaint  Patient presents with   Knee Pain    Left     No diagnosis found.  DOI/DOS/ Date: 02/27/22 arthroscopy   Improved has had 2 hip surgeries replacements so that has been the focus recently      20mg  of depomedrol instead of 40mg / documented waste in [facility]

## 2023-10-25 ENCOUNTER — Ambulatory Visit: Payer: Self-pay

## 2023-10-25 ENCOUNTER — Other Ambulatory Visit: Payer: Self-pay

## 2023-10-25 NOTE — Telephone Encounter (Signed)
  FYI Only or Action Required?: Action required by provider: clinical question for provider and update on patient condition.  Patient was last seen in primary care on 10/09/2023 by Bevely Doffing, FNP.  Called Nurse Triage reporting Medication Reaction.  Symptoms began several weeks ago.  Interventions attempted: Other: stopped medication.  Symptoms are: resolved when medication stops.  Triage Disposition: Information or Advice Only Call  Patient/caregiver understands and will follow disposition?: Yes  Copied from CRM [id number]. Topic: Clinical - Red Word Triage >> Oct 25, 2023 10:34 AM Ivette P wrote: [facility] that prompted transfer to Nurse Triage: medicaiton causing blurred vision - mirabegron  ER (MYRBETRIQ ) 25 MG TB24 tablet Reason for Disposition  Caller has medicine question only, adult not sick, AND triager answers question  Answer Assessment - Initial Assessment Questions 1. NAME of MEDICINE: What medicine(s) are you calling about?     Mrbetriq 25 mg 2. QUESTION: What is your question? (e.g., double dose of medicine, side effect)     Patient has been experiencing blurred vision x 3 weeks Reports that she has tried this medication in the past and experienced the same problem 3. PRESCRIBER: Who prescribed the medicine? Reason: if prescribed by specialist, call should be referred to that group.     L Huenink, FNP 4. SYMPTOMS: Do you have any symptoms? If Yes, ask: What symptoms are you having?  How bad are the symptoms (e.g., mild, moderate, severe)     Blurred vision all the time while on medication.   Difficulty reading, watching TV, and driving 5. PREGNANCY:  Is there any chance that you are pregnant? When was your last menstrual period?     N/A  Patient has stopped taking medication this week.   Agreeable to trying a different medication that does not cause this side effect  Patient having difficulty with mychart.  Please contact her by phone.   [phone]  Protocols used: Medication Question Call-A-AH

## 2023-10-28 ENCOUNTER — Telehealth: Payer: Self-pay

## 2023-10-28 NOTE — Telephone Encounter (Signed)
 Patient requesting to speak with a nurse in regard to overactive bladder. Please advise thanks

## 2023-10-29 ENCOUNTER — Other Ambulatory Visit: Payer: Self-pay

## 2023-10-29 DIAGNOSIS — N3946 Mixed incontinence: Secondary | ICD-10-CM

## 2023-10-29 MED ORDER — TROSPIUM CHLORIDE 20 MG PO TABS: 20.0000 mg | ORAL_TABLET | Freq: Every day | ORAL | 5 refills | Status: AC

## 2023-10-29 NOTE — Telephone Encounter (Signed)
 Pt states she cannot take Mybetriq for her overactive bladder, she states she took vit for about 2 weeks and stopped taking it due to it caused her vision to be blurry and is finally getting her vision back now

## 2023-10-29 NOTE — Telephone Encounter (Signed)
 She said Yes one that don't give her blurred vision. She also stated that that one worked she just couldn't function with the blurred vision

## 2023-11-05 ENCOUNTER — Telehealth: Payer: Self-pay

## 2023-11-05 ENCOUNTER — Other Ambulatory Visit: Payer: Self-pay

## 2023-11-05 NOTE — Telephone Encounter (Signed)
 Copied from CRM [id number]. Topic: Clinical - Medication Refill >> Nov 05, 2023  9:30 AM Winona R wrote: Pt going out of town first thing in the morning and really need this before going. Pt states she was using the Dexcon 7 however they both fail and a tech will be sending her replacements but insurance will not cover the replacements until the 15th of Sept.  Medication: glucose blood (ACCU-CHEK AVIVA PLUS) test strip  Has the patient contacted their pharmacy? Yes (Agent: If no, request that the patient contact the pharmacy for the refill. If patient does not wish to contact the pharmacy document the reason why and proceed with request.) (Agent: If yes, when and what did the pharmacy advise?)  This is the patient's preferred pharmacy:  [facility] - [place], [place] - [address] [place] [address] Phone: [phone] Fax: [phone]   Is this the correct pharmacy for this prescription? Yes If no, delete pharmacy and type the correct one.   Has the prescription been filled recently? No  Is the patient out of the medication? Yes  Has the patient been seen for an appointment in the last year OR does the patient have an upcoming appointment? Yes  Can we respond through MyChart? No  Agent: Please be advised that Rx refills may take up to 3 business days. We ask that you follow-up with your pharmacy.

## 2023-11-05 NOTE — Telephone Encounter (Signed)
 Sent to pharmacy

## 2023-11-07 ENCOUNTER — Other Ambulatory Visit: Payer: Self-pay

## 2023-11-08 ENCOUNTER — Telehealth: Payer: Self-pay

## 2023-11-08 NOTE — Telephone Encounter (Signed)
 Copied from CRM [id number]. Topic: General - Other >> Nov 08, 2023  4:38 PM Turkey B wrote:  [employer] called, states meds , cyclobenzaprine /oxycodone   and trospium , may cause drowsiness and dizziness, risk of falling [phone] and Dr bevely may want to decrease dosage or change medication

## 2023-11-11 NOTE — Telephone Encounter (Signed)
Recommendations noted.

## 2023-12-02 ENCOUNTER — Encounter: Payer: Self-pay | Admitting: Internal Medicine

## 2023-12-02 ENCOUNTER — Other Ambulatory Visit (HOSPITAL_COMMUNITY): Payer: Self-pay

## 2023-12-02 ENCOUNTER — Telehealth: Payer: Self-pay | Admitting: Pharmacy Technician

## 2023-12-02 NOTE — Telephone Encounter (Signed)
 Pharmacy Patient Advocate Encounter   Received notification from CoverMyMeds that prior authorization for Insulin  Degludec FlexTouch 200UNIT/ML pen-injectors is required/requested.   Insurance verification completed.   The patient is insured through [employer].   Per test claim:  Brand Name Tresiba  Flextouch 200 unit/ml is preferred by the insurance.  If suggested medication is appropriate, Please send in a new RX and discontinue this one. If not, please advise as to why it's not appropriate so that we may request a Prior Authorization. Please note, some preferred medications may still require a PA.  If the suggested medications have not been trialed and there are no contraindications to their use, the PA will not be submitted, as it will not be approved.  New prescription is not required. Medication can be switch at the pharmacy as brand name preferred. Patient's pharmacy dispensed brand name on 12/01/2023. Next refill is due on or after 02/23/2024.

## 2023-12-04 ENCOUNTER — Encounter (INDEPENDENT_AMBULATORY_CARE_PROVIDER_SITE_OTHER): Payer: Self-pay | Admitting: Gastroenterology

## 2023-12-09 ENCOUNTER — Ambulatory Visit: Payer: Self-pay | Admitting: *Deleted

## 2023-12-09 NOTE — Telephone Encounter (Signed)
Noted appointment made

## 2023-12-09 NOTE — Telephone Encounter (Signed)
 FYI Only or Action Required?: FYI only for provider.  Patient was last seen in primary care on 10/09/2023 by Bevely Doffing, FNP.  Called Nurse Triage reporting Hyperglycemia.  Symptoms began several months ago.  Interventions attempted: Nothing.  Symptoms are: getting worse  Triage Disposition: Call PCP Now  Patient/caregiver understands and will follow disposition?: Yes   Reason for Disposition  [1] Caller has URGENT medication or insulin  device (e.g., pump, continuous monitoring) question AND [2] triager unable to answer question  Answer Assessment - Initial Assessment Questions Patient is having daily fluctuations in her glucose- high/low- mostly at night. Patient feels she is starting to have symptoms and wants to be seen- may need medication adjustment. Patient will bring her records with her for PCP review    1. BLOOD GLUCOSE: What is your blood glucose level?      Patient states she is having irregular glucose highs/lows, high reading mostly at night- 350, high 200 2. ONSET: When did you check the blood glucose?     Fluctuating 2-3 months 3. USUAL RANGE: What is your glucose level usually? (e.g., usual fasting morning value, usual evening value)    fasting 70-113, evening and during the night has fluctuation   5. TYPE 1 or 2:  Do you know what type of diabetes you have?  (e.g., Type 1, Type 2, Gestational; doesn't know)      Type 2 6. INSULIN : Do you take insulin ? What type of insulin (s) do you use? What is the mode of delivery? (syringe, pen; injection or pump)?     Triseba Flexpen 18unit 7. DIABETES PILLS: Do you take any pills for your diabetes? If Yes, ask: Have you missed taking any pills recently?     Actos  8. OTHER SYMPTOMS: Do you have any symptoms? (e.g., fever, frequent urination, difficulty breathing, dizziness, weakness, vomiting)     Blurred vision  Protocols used: Diabetes - High Blood Sugar-A-AH Copied from CRM #[id number]. Topic: Clinical  - Red Word Triage >> Dec 09, 2023 12:10 PM Everette C wrote: [facility] that prompted transfer to Nurse Triage: The patient shares that their blood sugar is elevating and dropping rapidly getting as high as 350 last night and as low 54. It is currently 111 but they are also experiencing sudden blurred vision.

## 2023-12-11 ENCOUNTER — Ambulatory Visit (INDEPENDENT_AMBULATORY_CARE_PROVIDER_SITE_OTHER)

## 2023-12-11 VITALS — BP 135/73 | HR 85 | Ht 60.0 in | Wt 110.0 lb

## 2023-12-11 DIAGNOSIS — J410 Simple chronic bronchitis: Secondary | ICD-10-CM | POA: Insufficient documentation

## 2023-12-11 DIAGNOSIS — R0989 Other specified symptoms and signs involving the circulatory and respiratory systems: Secondary | ICD-10-CM | POA: Diagnosis not present

## 2023-12-11 DIAGNOSIS — F331 Major depressive disorder, recurrent, moderate: Secondary | ICD-10-CM

## 2023-12-11 DIAGNOSIS — K219 Gastro-esophageal reflux disease without esophagitis: Secondary | ICD-10-CM

## 2023-12-11 DIAGNOSIS — Z794 Long term (current) use of insulin: Secondary | ICD-10-CM | POA: Diagnosis not present

## 2023-12-11 DIAGNOSIS — K580 Irritable bowel syndrome with diarrhea: Secondary | ICD-10-CM

## 2023-12-11 DIAGNOSIS — E119 Type 2 diabetes mellitus without complications: Secondary | ICD-10-CM

## 2023-12-11 MED ORDER — OMEPRAZOLE 20 MG PO CPDR: 20.0000 mg | DELAYED_RELEASE_CAPSULE | Freq: Every day | ORAL | 3 refills | Status: DC

## 2023-12-11 NOTE — Progress Notes (Signed)
 Established Patient Office Visit  Subjective   Patient ID: Karen Horn, female    DOB: Dec 15, 1945  Age: 78 y.o. MRN: [id number]  Chief Complaint  Patient presents with   Medical Management of Chronic Issues    Pt here for glucose fluctuating     HPI Discussed the use of AI scribe software for clinical note transcription with the patient, who gave verbal consent to proceed.  History of Present Illness   Karen Horn is a 78 year old female with diabetes who presents with fluctuating blood sugar levels.  Glycemic instability - Significant fluctuations in blood glucose levels, with morning readings as low as 54 mg/dL causing awakening from sleep - Nocturnal hyperglycemia with blood glucose levels exceeding 300 mg/dL - Current blood glucose level is 203 mg/dL after breakfast - Able to perceive symptoms of hypoglycemia - Blood glucose instability has worsened since initiation of Actos  - Discontinued Jardiance  due to weight loss and cost concerns - Weight gain observed after stopping Jardiance  and starting water  aerobics three times weekly following hip replacement  Psychosocial stressors - Increased stress attributed to husband's unusual behavior, including removing his wedding band and making demands regarding a family heirloom - Concern regarding husband's mental state, with family history of dementia in his siblings - Stress considered a potential contributor to glycemic instability  Gastroesophageal reflux symptoms - Not currently taking any medication for acid reflux      Patient Active Problem List   Diagnosis Date Noted   Major depressive disorder, recurrent episode, moderate degree (HCC) 12/11/2023   Tremor 06/20/2023   OP (osteoporosis) 04/15/2023   Osteoarthritis of right hip 03/21/2023   Subacute maxillary sinusitis 03/04/2023   Mixed stress and urge urinary incontinence 09/13/2022   Hyperlipidemia associated with type 2 diabetes mellitus (HCC)  01/17/2022   Anxiety and depression 01/17/2022   Stress incontinence, female 01/17/2022   HSV (herpes simplex virus) infection 01/17/2022   Dysphagia 12/14/2021   Throat clearing 12/14/2021   Reactive arthropathy of hip (HCC) 10/12/2021   Irritable bowel syndrome with diarrhea 08/14/2021   Localized osteoporosis without pathological fracture 06/19/2021   Menopausal and postmenopausal disorder 09/29/2020   Vitamin D  deficiency 09/29/2020   Pain of both hip joints 09/29/2020   GERD (gastroesophageal reflux disease) 09/20/2020   Hypothyroidism 09/20/2020   Hyperglycemia due to type 2 diabetes mellitus (HCC) 08/03/2020   Seasonal and perennial allergic rhinitis 01/10/2020   History of lumbar surgery 10/01/2019   TMJ syndrome 03/19/2018   Restless legs 07/10/2016   Iron deficiency anemia 06/27/2016   Osteopenia of multiple sites 06/22/2015   Type 2 diabetes mellitus (HCC) 08/26/2014   DDD (degenerative disc disease), lumbosacral 07/08/2012   Statin intolerance 10/04/2011   Breast calcification, left 09/22/2010   Mitral regurgitation 09/22/2010    ROS    Objective:     BP 135/73   Pulse 85   Ht 5' (1.524 m)   Wt 110 lb (49.9 kg)   SpO2 97%   BMI 21.48 kg/m  BP Readings from Last 3 Encounters:  12/11/23 135/73  10/09/23 124/76  09/20/23 136/75   Wt Readings from Last 3 Encounters:  12/12/23 110 lb (49.9 kg)  12/11/23 110 lb (49.9 kg)  10/09/23 102 lb (46.3 kg)     Physical Exam Vitals and nursing note reviewed.  Constitutional:      Appearance: Normal appearance.  HENT:     Head: Normocephalic.  Eyes:     Extraocular Movements: Extraocular movements intact.  Pupils: Pupils are equal, round, and reactive to light.  Cardiovascular:     Rate and Rhythm: Normal rate and regular rhythm.  Pulmonary:     Effort: Pulmonary effort is normal.     Breath sounds: Normal breath sounds.  Abdominal:     General: Bowel sounds are normal.     Palpations: Abdomen is  soft.     Tenderness: There is no abdominal tenderness. There is no right CVA tenderness or left CVA tenderness.  Musculoskeletal:     Cervical back: Normal range of motion and neck supple.  Neurological:     Mental Status: She is alert and oriented to person, place, and time.  Psychiatric:        Mood and Affect: Mood normal.        Thought Content: Thought content normal.     Diabetic foot exam was performed with the following findings:   No deformities, ulcerations, or other skin breakdown Normal sensation of 10g monofilament Intact posterior tibialis and dorsalis pedis pulses      Last CBC Lab Results  Component Value Date   WBC 6.1 03/21/2023   HGB 14.3 03/21/2023   HCT 45.9 03/21/2023   MCV 94 03/21/2023   MCH 29.1 03/21/2023   RDW 13.4 03/21/2023   PLT 236 03/21/2023   Last metabolic panel Lab Results  Component Value Date   GLUCOSE 177 (H) 12/11/2023   NA 139 12/11/2023   K 4.1 12/11/2023   CL 100 12/11/2023   CO2 25 12/11/2023   BUN 18 12/11/2023   CREATININE 0.83 12/11/2023   EGFR 73 12/11/2023   CALCIUM  10.0 12/11/2023   PROT 6.6 03/21/2023   ALBUMIN 4.4 03/21/2023   LABGLOB 2.2 03/21/2023   AGRATIO 1.8 01/15/2022   BILITOT 0.4 03/21/2023   ALKPHOS 41 (L) 03/21/2023   AST 26 03/21/2023   ALT 23 03/21/2023   ANIONGAP 9 02/23/2022   Last lipids Lab Results  Component Value Date   CHOL 160 03/21/2023   HDL 58 03/21/2023   LDLCALC 79 03/21/2023   TRIG 135 03/21/2023   CHOLHDL 2.8 03/21/2023   Last hemoglobin A1c Lab Results  Component Value Date   HGBA1C 8.0 (H) 12/11/2023   Last thyroid  functions Lab Results  Component Value Date   TSH 2.090 03/21/2023   FREET4 1.65 03/21/2023   Last vitamin D  Lab Results  Component Value Date   VD25OH 35.3 03/21/2023   Last vitamin B12 and Folate Lab Results  Component Value Date   VITAMINB12 362 03/21/2023   FOLATE 14.2 03/21/2023      The ASCVD Risk score (Arnett DK, et al., 2019) failed  to calculate for the following reasons:   The systolic blood pressure is missing    Assessment & Plan:   Problem List Items Addressed This Visit       Digestive   GERD (gastroesophageal reflux disease)   Requires ongoing management with omeprazole . - Refill omeprazole  prescription.         Relevant Medications   omeprazole  (PRILOSEC) 20 MG capsule     Endocrine   Type 2 diabetes mellitus (HCC)   Fluctuating blood glucose with nocturnal hypoglycemia and daytime hyperglycemia. Actos  ineffective, associated with weight changes. Discussed Januvia as alternative. - Switch Tresiba  to bedtime to evaluate effects on glucose levels. - Consider bedtime protein snack for glucose stabilization. - Order A1c and kidney function tests. - Refill Actos  prescription.      Relevant Orders   Basic Metabolic Panel (BMET) (  Completed)   HgB A1c (Completed)   HM Diabetes Foot Exam (Completed)     Other   Throat clearing   Relevant Medications   omeprazole  (PRILOSEC) 20 MG capsule   Major depressive disorder, recurrent episode, moderate degree (HCC) - Primary    Return in about 6 months (around 06/10/2024) for chronic follow-up with PCP.    Leita Longs, FNP

## 2023-12-12 ENCOUNTER — Other Ambulatory Visit: Payer: Self-pay | Admitting: Internal Medicine

## 2023-12-12 ENCOUNTER — Ambulatory Visit

## 2023-12-12 VITALS — Ht 60.0 in | Wt 110.0 lb

## 2023-12-12 DIAGNOSIS — Z0000 Encounter for general adult medical examination without abnormal findings: Secondary | ICD-10-CM

## 2023-12-12 DIAGNOSIS — Z1231 Encounter for screening mammogram for malignant neoplasm of breast: Secondary | ICD-10-CM

## 2023-12-12 DIAGNOSIS — Z780 Asymptomatic menopausal state: Secondary | ICD-10-CM

## 2023-12-12 DIAGNOSIS — B009 Herpesviral infection, unspecified: Secondary | ICD-10-CM

## 2023-12-12 LAB — BASIC METABOLIC PANEL WITH GFR
BUN/Creatinine Ratio: 22 (ref 12–28)
BUN: 18 mg/dL (ref 8–27)
CO2: 25 mmol/L (ref 20–29)
Calcium: 10 mg/dL (ref 8.7–10.3)
Chloride: 100 mmol/L (ref 96–106)
Creatinine, Ser: 0.83 mg/dL (ref 0.57–1.00)
Glucose: 177 mg/dL — ABNORMAL HIGH (ref 70–99)
Potassium: 4.1 mmol/L (ref 3.5–5.2)
Sodium: 139 mmol/L (ref 134–144)
eGFR: 73 mL/min/1.73 (ref >59)

## 2023-12-12 LAB — HEMOGLOBIN A1C
Est. average glucose Bld gHb Est-mCnc: 183 mg/dL
Hgb A1c MFr Bld: 8 % — ABNORMAL HIGH (ref 4.8–5.6)

## 2023-12-12 NOTE — Progress Notes (Signed)
 Subjective:   Karen Horn is a 78 y.o. who presents for a Medicare Wellness preventive visit.  As a reminder, Annual Wellness Visits don't include a physical exam, and some assessments may be limited, especially if this visit is performed virtually. We may recommend an in-person follow-up visit with your provider if needed.  Visit Complete: Virtual I connected with  Karen Horn on 12/12/23 by a audio enabled telemedicine application and verified that I am speaking with the correct person using two identifiers.  Patient Location: Home  Provider Location: Home Office  I discussed the limitations of evaluation and management by telemedicine. The patient expressed understanding and agreed to proceed.  Vital Signs: Because this visit was a virtual/telehealth visit, some criteria may be missing or patient reported. Any vitals not documented were not able to be obtained and vitals that have been documented are patient reported.  VideoDeclined- This patient declined Librarian, academic. Therefore the visit was completed with audio only.  Persons Participating in Visit: Patient.  AWV Questionnaire: No: Patient Medicare AWV questionnaire was not completed prior to this visit.  Cardiac Risk Factors include: advanced age (>42men, >58 women);diabetes mellitus;dyslipidemia;hypertension     Objective:    Today's Vitals   12/12/23 0837  Weight: 110 lb (49.9 kg)  Height: 5' (1.524 m)   Body mass index is 21.48 kg/m.     12/12/2023    8:34 AM 03/15/2022    2:40 PM 02/27/2022   12:01 PM 02/23/2022    3:51 PM 09/06/2021    9:40 AM 03/08/2021   12:05 AM  Advanced Directives  Does Patient Have a Medical Advance Directive? No No No Yes No No  Does patient want to make changes to medical advance directive?  No - Patient declined No - Patient declined No - Patient declined    Would patient like information on creating a medical advance directive? Yes  (MAU/Ambulatory/Procedural Areas - Information given) No - Patient declined   No - Patient declined     Current Medications (verified) Outpatient Encounter Medications as of 12/12/2023  Medication Sig   ACCU-CHEK AVIVA PLUS test strip USE AS DIRECTED TO TEST THREE TIMES DAILY.   aspirin  81 MG chewable tablet Chew 81 mg by mouth in the morning.   atorvastatin  (LIPITOR) 40 MG tablet TAKE 1 TABLET BY MOUTH ONCE A DAY   Blood Glucose Monitoring Suppl (ACCU-CHEK AVIVA PLUS) w/Device KIT by Does not apply route.   cholecalciferol (VITAMIN D ) 25 MCG (1000 UNIT) tablet Take 2,000 Units by mouth in the morning.   co-enzyme Q-10 30 MG capsule Take 30 mg by mouth 3 (three) times daily.   Continuous Glucose Receiver (DEXCOM G7 RECEIVER) DEVI 1 Device by Does not apply route continuous.   Continuous Glucose Sensor (DEXCOM G7 SENSOR) MISC 1 each by Does not apply route every 14 (fourteen) days.   cyclobenzaprine (FLEXERIL) 10 MG tablet Take 10 mg by mouth 3 (three) times daily as needed.   EPINEPHrine  0.3 mg/0.3 mL IJ SOAJ injection epinephrine  0.3 mg/0.3 mL injection, auto-injector (Patient taking differently: Inject 0.3 mg into the muscle as needed for anaphylaxis.)   insulin  degludec (TRESIBA  FLEXTOUCH) 200 UNIT/ML FlexTouch Pen Inject 18 Units into the skin in the morning.   Insulin  Pen Needle (SURE COMFORT PEN NEEDLES) 31G X 5 MM MISC Sure Comfort Pen Needle 31 gauge x 5/16   levothyroxine  (SYNTHROID ) 75 MCG tablet Take 1 tablet (75 mcg total) by mouth daily before breakfast.   omeprazole  (PRILOSEC)  20 MG capsule Take 1 capsule (20 mg total) by mouth daily.   ondansetron  (ZOFRAN ) 4 MG tablet Take 1 tablet (4 mg total) by mouth every 8 (eight) hours as needed. for nausea   oxyCODONE  (OXY IR/ROXICODONE ) 5 MG immediate release tablet TAKE 1-2 TABLETS BY MOUTH EVERY 6 HOURS AS NEEDED FOR SEVERE POSTOP PAIN   pioglitazone  (ACTOS ) 30 MG tablet Take 1 tablet (30 mg total) by mouth daily.    promethazine -dextromethorphan (PROMETHAZINE -DM) 6.25-15 MG/5ML syrup Take 5 mLs by mouth 4 (four) times daily as needed.   rOPINIRole  (REQUIP ) 1 MG tablet Take 1 tablet (1 mg total) by mouth 3 (three) times daily.   sertraline  (ZOLOFT ) 25 MG tablet Take 1 tablet (25 mg total) by mouth daily.   trospium  (SANCTURA ) 20 MG tablet Take 1 tablet (20 mg total) by mouth at bedtime.   valACYclovir  (VALTREX ) 500 MG tablet Take 1 tablet (500 mg total) by mouth daily.   No facility-administered encounter medications on file as of 12/12/2023.    Allergies (verified) Codeine, Gabapentin, Sulfa antibiotics, Atorvastatin  calcium , Escitalopram, Ezetimibe, Fluvastatin, Fosamax [alendronate], Levofloxacin, Pramipexole, Prednisone, Rosuvastatin calcium , Simvastatin, Sulfur , and Trazodone and nefazodone   History: Past Medical History:  Diagnosis Date   Diabetes mellitus without complication (HCC)    Hypothyroidism    Thyroid  disease    Past Surgical History:  Procedure Laterality Date   ABDOMINAL HYSTERECTOMY     ADENOIDECTOMY     BACK SURGERY     BIOPSY  09/06/2021   Procedure: BIOPSY;  Surgeon: Eartha Angelia Sieving, MD;  Location: [facility];  Service: Gastroenterology;;   ESOPHAGOGASTRODUODENOSCOPY (EGD) WITH PROPOFOL  N/A 09/06/2021   Procedure: ESOPHAGOGASTRODUODENOSCOPY (EGD) WITH PROPOFOL ;  Surgeon: Eartha Angelia Sieving, MD;  Location: [facility];  Service: Gastroenterology;  Laterality: N/A;  215 ASA 2   KNEE ARTHROSCOPY WITH MEDIAL MENISECTOMY Left 02/27/2022   Procedure: KNEE ARTHROSCOPY WITH MEDIAL MENISCECTOMY;  Surgeon: Margrette Taft BRAVO, MD;  Location: [facility];  Service: Orthopedics;  Laterality: Left;   SINOSCOPY     TONSILLECTOMY     History reviewed. No pertinent family history. Social History   Socioeconomic History   Marital status: Married    Spouse name: Not on file   Number of children: Not on file   Years of education: Not on file   Highest education level: Not  on file  Occupational History   Not on file  Tobacco Use   Smoking status: Never   Smokeless tobacco: Never  Vaping Use   Vaping status: Never Used  Substance and Sexual Activity   Alcohol use: Not Currently   Drug use: Never   Sexual activity: Not Currently  Other Topics Concern   Not on file  Social History Narrative   Not on file   Social Drivers of Health   Financial Resource Strain: Low Risk  (12/12/2023)   Overall Financial Resource Strain (CARDIA)    Difficulty of Paying Living Expenses: Not hard at all  Food Insecurity: No Food Insecurity (12/12/2023)   Hunger Vital Sign    Worried About Running Out of Food in the Last Year: Never true    Ran Out of Food in the Last Year: Never true  Transportation Needs: No Transportation Needs (12/12/2023)   PRAPARE - Administrator, Civil Service (Medical): No    Lack of Transportation (Non-Medical): No  Physical Activity: Sufficiently Active (12/12/2023)   Exercise Vital Sign    Days of Exercise per Week: 7 days  Minutes of Exercise per Session: 30 min  Stress: No Stress Concern Present (12/12/2023)   [employer] of Occupational Health - Occupational Stress Questionnaire    Feeling of Stress: Not at all  Social Connections: Socially Integrated (12/12/2023)   Social Connection and Isolation Panel    Frequency of Communication with Friends and Family: More than three times a week    Frequency of Social Gatherings with Friends and Family: Once a week    Attends Religious Services: More than 4 times per year    Active Member of [employer] or Organizations: Yes    Attends Engineer, structural: More than 4 times per year    Marital Status: Married    Tobacco Counseling Counseling given: Yes    Clinical Intake:  Pre-visit preparation completed: Yes  Pain : No/denies pain     BMI - recorded: 21.48 Nutritional Status: BMI of 19-24  Normal Nutritional Risks: None Diabetes: Yes CBG done?: No  (telehealth visit.) Did pt. bring in CBG monitor from home?: No  Lab Results  Component Value Date   HGBA1C 8.0 (H) 12/11/2023   HGBA1C 6.8 (H) 09/20/2023   HGBA1C 7.7 (H) 03/21/2023     How often do you need to have someone help you when you read instructions, pamphlets, or other written materials from your doctor or pharmacy?: 1 - Never  Interpreter Needed?: No  Information entered by :: Bayli Quesinberry W CMA (AAMA)   Activities of Daily Living     12/12/2023    8:53 AM  In your present state of health, do you have any difficulty performing the following activities:  Hearing? 0  Vision? 0  Difficulty concentrating or making decisions? 0  Walking or climbing stairs? 0  Dressing or bathing? 0  Doing errands, shopping? 0  Preparing Food and eating ? N  Using the Toilet? N  In the past six months, have you accidently leaked urine? N  Do you have problems with loss of bowel control? N  Managing your Medications? N  Managing your Finances? N  Housekeeping or managing your Housekeeping? N    Patient Care Team: Bevely Doffing, FNP as PCP - General (Family Medicine) Melodi Lerner, MD as Consulting Physician (Orthopedic Surgery) [facility] (Ophthalmology) [facility] [place] (Ophthalmology) Margrette Taft BRAVO, MD as Consulting Physician (Orthopedic Surgery) Braulio Hough, MD as Referring Physician (Endocrinology)  I have updated your Care Teams any recent Medical Services you may have received from other providers in the past year.     Assessment:   This is a routine wellness examination for Karen Horn.  Hearing/Vision screen Hearing Screening - Comments:: Patient denies any hearing difficulties.   Vision Screening - Comments:: Wears rx glasses - up to date with routine eye exams with  [facility] the [place])   Goals Addressed               This Visit's Progress     Work on muscle tone and getting into the gym more (pt-stated)         Had both hips replaced this year.  RT THA in FEB 2025 LT THA in July 2025        Depression Screen     12/12/2023    8:58 AM 09/20/2023   10:45 AM 06/20/2023   11:21 AM 04/05/2023   10:39 AM 12/20/2022    8:59 AM 09/13/2022   10:21 AM 07/13/2022   10:48 AM  PHQ 2/9 Scores  PHQ -  2 Score 0 0 1 2 2 2 2   PHQ- 9 Score 0 5 7 9 9 9 9      Fall Risk     12/12/2023    8:51 AM 09/20/2023   10:45 AM 06/20/2023   11:21 AM 04/05/2023   10:39 AM 03/21/2023    8:32 AM  Fall Risk   Falls in the past year? 0 0 0 0 0  Number falls in past yr: 0  0 0 0  Injury with Fall? 0  0 0 0  Risk for fall due to : No Fall Risks No Fall Risks No Fall Risks No Fall Risks No Fall Risks  Follow up Falls evaluation completed;Education provided;Falls prevention discussed Falls evaluation completed Falls evaluation completed;Education provided;Falls prevention discussed Falls evaluation completed Falls evaluation completed    MEDICARE RISK AT HOME:  Medicare Risk at Home Any stairs in or around the home?: Yes If so, are there any without handrails?: Yes Home free of loose throw rugs in walkways, pet beds, electrical cords, etc?: Yes Adequate lighting in your home to reduce risk of falls?: Yes Life alert?: No Use of a cane, walker or w/c?: No Grab bars in the bathroom?: Yes Shower chair or bench in shower?: Yes Elevated toilet seat or a handicapped toilet?: Yes  TIMED UP AND GO:  Was the test performed?  No  Cognitive Function: 6CIT completed        12/12/2023    8:54 AM 03/15/2022    2:41 PM  6CIT Screen  What Year? 0 points 0 points  What month? 0 points 0 points  What time? 0 points 0 points  Count back from 20 0 points 0 points  Months in reverse 0 points 0 points  Repeat phrase 0 points 0 points  Total Score 0 points 0 points    Immunizations Immunization History  Administered Date(s) Administered   Fluad Quad(high Dose 65+) 01/11/2021, 11/29/2021, 11/13/2022   Influenza,trivalent,  recombinat, inj, PF 02/19/2006, 11/20/2010   Influenza-Unspecified 02/18/2012, 11/29/2021, 11/22/2023   Pneumococcal Conjugate-13 11/20/2015, 04/26/2016   Pneumococcal Polysaccharide-23 11/20/2017   Pneumococcal-Unspecified 06/28/2000   Td 09/27/2005   Tdap 09/27/2005    Screening Tests Health Maintenance  Topic Date Due   Zoster Vaccines- Shingrix (1 of 2) Never done   DTaP/Tdap/Td (3 - Td or Tdap) 09/28/2015   FOOT EXAM  02/16/2023   DEXA SCAN  02/16/2023   COVID-19 Vaccine (1 - 2025-26 season) Never done   Mammogram  02/04/2024   OPHTHALMOLOGY EXAM  03/19/2024   Diabetic kidney evaluation - Urine ACR  03/20/2024   HEMOGLOBIN A1C  06/10/2024   Diabetic kidney evaluation - eGFR measurement  12/10/2024   Medicare Annual Wellness (AWV)  12/11/2024   Pneumococcal Vaccine: 50+ Years  Completed   Influenza Vaccine  Completed   Hepatitis C Screening  Completed   Meningococcal B Vaccine  Aged Out    Health Maintenance Health Maintenance Due  Topic Date Due   Zoster Vaccines- Shingrix (1 of 2) Never done   DTaP/Tdap/Td (3 - Td or Tdap) 09/28/2015   FOOT EXAM  02/16/2023   DEXA SCAN  02/16/2023   COVID-19 Vaccine (1 - 2025-26 season) Never done   Mammogram  02/04/2024   Health Maintenance Items Addressed: Mammogram ordered, DEXA ordered  Additional Screening:  Vision Screening: Recommended annual ophthalmology exams for early detection of glaucoma and other disorders of the eye. Would you like a referral to an eye doctor? No    Dental  Screening: Recommended annual dental exams for proper oral hygiene  Community Resource Referral / Chronic Care Management: CRR required this visit?  No   CCM required this visit?  No   Plan:    I have personally reviewed and noted the following in the patient's chart:   Medical and social history Use of alcohol, tobacco or illicit drugs  Current medications and supplements including opioid prescriptions. Patient is currently taking  opioid prescriptions. Information provided to patient regarding non-opioid alternatives. Patient advised to discuss non-opioid treatment plan with their provider. Functional ability and status Nutritional status Physical activity Advanced directives List of other physicians Hospitalizations, surgeries, and ER visits in previous 12 months Vitals Screenings to include cognitive, depression, and falls Referrals and appointments  In addition, I have reviewed and discussed with patient certain preventive protocols, quality metrics, and best practice recommendations. A written personalized care plan for preventive services as well as general preventive health recommendations were provided to patient.   Corlis Angelica, CMA   12/12/2023   After Visit Summary: (Mail) Due to this being a telephonic visit, the after visit summary with patients personalized plan was offered to patient via mail   Notes: Nothing significant to report at this time.

## 2023-12-12 NOTE — Patient Instructions (Signed)
 [name],  Thank you for taking the time for your Medicare Wellness Visit. I appreciate your continued commitment to your health goals. Please review the care plan we discussed, and feel free to reach out if I can assist you further.  Medicare recommends these wellness visits once per year to help you and your care team stay ahead of potential health issues. These visits are designed to focus on prevention, allowing your provider to concentrate on managing your acute and chronic conditions during your regular appointments.  Please note that Annual Wellness Visits do not include a physical exam. Some assessments may be limited, especially if the visit was conducted virtually. If needed, we may recommend a separate in-person follow-up with your provider.  Ongoing Care  Seeing your primary care provider every 3 to 6 months helps us  monitor your health and provide consistent, personalized care.   Referrals   Mammogram/Bone Density Screening: Call [facility] @ Phone: [phone]   If you are unable to login to your Mychart, please call [phone] for assistance.    Recommended Screenings:  Health Maintenance  Topic Date Due   Zoster (Shingles) Vaccine (1 of 2) Never done   DTaP/Tdap/Td vaccine (3 - Td or Tdap) 09/28/2015   Complete foot exam   02/16/2023   DEXA scan (bone density measurement)  02/16/2023   COVID-19 Vaccine (1 - 2025-26 season) Never done   Breast Cancer Screening  02/04/2024   Eye exam for diabetics  03/19/2024   Yearly kidney health urinalysis for diabetes  03/20/2024   Hemoglobin A1C  06/10/2024   Yearly kidney function blood test for diabetes  12/10/2024   Medicare Annual Wellness Visit  12/11/2024   Pneumococcal Vaccine for age over 1  Completed   Flu Shot  Completed   Hepatitis C Screening  Completed   Meningitis B Vaccine  Aged Out       12/12/2023    8:34 AM  Advanced Directives  Does Patient Have a Medical Advance Directive? No   Would patient like information on creating a medical advance directive? Yes (MAU/Ambulatory/Procedural Areas - Information given)    Advance Care Planning is important because it: Ensures you receive medical care that aligns with your values, goals, and preferences. Provides guidance to your family and loved ones, reducing the emotional burden of decision-making during critical moments.  Vision: Annual vision screenings are recommended for early detection of glaucoma, cataracts, and diabetic retinopathy. These exams can also reveal signs of chronic conditions such as diabetes and high blood pressure.  Dental: Annual dental screenings help detect early signs of oral cancer, gum disease, and other conditions linked to overall health, including heart disease and diabetes.  Please see the attached documents for additional preventive care recommendations.

## 2023-12-15 ENCOUNTER — Ambulatory Visit: Payer: Self-pay

## 2023-12-15 NOTE — Assessment & Plan Note (Signed)
 Requires ongoing management with omeprazole . - Refill omeprazole  prescription.

## 2023-12-15 NOTE — Assessment & Plan Note (Signed)
 Fluctuating blood glucose with nocturnal hypoglycemia and daytime hyperglycemia. Actos  ineffective, associated with weight changes. Discussed Januvia as alternative. - Switch Tresiba  to bedtime to evaluate effects on glucose levels. - Consider bedtime protein snack for glucose stabilization. - Order A1c and kidney function tests. - Refill Actos  prescription.

## 2023-12-23 ENCOUNTER — Encounter: Payer: Self-pay | Admitting: Radiology

## 2023-12-23 ENCOUNTER — Ambulatory Visit: Payer: Self-pay

## 2023-12-23 NOTE — Telephone Encounter (Signed)
 FYI Only or Action Required?: Action required by provider: clinical question for provider.  Patient was last seen in primary care on 12/11/2023 by Bevely Doffing, FNP.  Called Nurse Triage reporting Blood Sugar Problem.  Symptoms began several weeks ago.  Interventions attempted: Prescription medications: tresiba , actos .  Symptoms are: unchanged.  Triage Disposition: Home Care  Patient/caregiver understands and will follow disposition?: No, wishes to speak with PCP    Copied from CRM #[id number]. Topic: Clinical - Red Word Triage >> Dec 23, 2023 11:40 AM Larissa RAMAN wrote: [facility] that prompted transfer to Nurse Triage: Pt states she is using her flexpen at night as told by PCP, Blood sugar still high at 290-300. This morning at 6am blood sugar was 176. Reason for Disposition  [1] Blood glucose 240 - 300 mg/dL (13.3 - 16.7 mmol/L) AND [2] uses insulin  (e.g., insulin -dependent, all people with type 1 diabetes)  Answer Assessment - Initial Assessment Questions Pt states that she has made the change to Tresiba  at night, actos  during the day first thing this am. During the day her BS can be 280-300's. Lowest she gets at night is 46. This am fasting she was 176. She states she also has been having diarrhea for the last couple of months. She states it hits her all and once and sometimes she does not make it. She state her rhona is not working so she will need a phone call to discuss.      1. BLOOD GLUCOSE: What is your blood glucose level?      176  fasting  2. ONSET: When did you check the blood glucose?     This am 3. USUAL RANGE: What is your glucose level usually? (e.g., usual fasting morning value, usual evening value)    46-174 4. KETONES: Do you check for ketones (urine or blood test strips)? If Yes, ask: What does the test show now?      no 5. TYPE 1 or 2:  Do you know what type of diabetes you have?  (e.g., Type 1, Type 2, Gestational; doesn't know)      Type 2 6.  INSULIN : Do you take insulin ? What type of insulin (s) do you use? What is the mode of delivery? (syringe, pen; injection or pump)?      tresiba  7. DIABETES PILLS: Do you take any pills for your diabetes? If Yes, ask: Have you missed taking any pills recently?     actos  8. OTHER SYMPTOMS: Do you have any symptoms? (e.g., fever, frequent urination, difficulty breathing, dizziness, weakness, vomiting)     When it drops meter wakes her up she feels a little dizzy. diarrhea lately  Protocols used: Diabetes - High Blood Sugar-A-AH

## 2023-12-25 ENCOUNTER — Ambulatory Visit

## 2023-12-26 ENCOUNTER — Other Ambulatory Visit: Payer: Self-pay

## 2023-12-26 DIAGNOSIS — E119 Type 2 diabetes mellitus without complications: Secondary | ICD-10-CM

## 2023-12-26 MED ORDER — DEXCOM G7 SENSOR MISC: 1.0000 | 5 refills | Status: AC

## 2023-12-26 MED ORDER — PIOGLITAZONE HCL 30 MG PO TABS: 45.0000 mg | ORAL_TABLET | Freq: Every day | ORAL | 1 refills | Status: DC

## 2023-12-26 NOTE — Telephone Encounter (Signed)
 Recommend she increase the Tresiba  by 2 units (20 units), and taking one and a half of the Actos .  Recommend eating a small snack at bedtime with protein.  Call next week with blood sugar readings.  Does she have Dexcom or Libre continuous glucose monitor?  If not, I will send in prescription for this.

## 2023-12-26 NOTE — Telephone Encounter (Signed)
 Pt advised.

## 2023-12-27 ENCOUNTER — Telehealth: Payer: Self-pay | Admitting: Pharmacy Technician

## 2023-12-27 ENCOUNTER — Ambulatory Visit: Payer: Self-pay

## 2023-12-27 ENCOUNTER — Telehealth: Payer: Self-pay

## 2023-12-27 ENCOUNTER — Other Ambulatory Visit (HOSPITAL_COMMUNITY): Payer: Self-pay

## 2023-12-27 ENCOUNTER — Other Ambulatory Visit: Payer: Self-pay

## 2023-12-27 DIAGNOSIS — E1165 Type 2 diabetes mellitus with hyperglycemia: Secondary | ICD-10-CM

## 2023-12-27 MED ORDER — PIOGLITAZONE HCL 45 MG PO TABS: 45.0000 mg | ORAL_TABLET | Freq: Every day | ORAL | 3 refills | Status: DC

## 2023-12-27 NOTE — Telephone Encounter (Signed)
 In room with provider now discussing this matter

## 2023-12-27 NOTE — Telephone Encounter (Signed)
 Patient came by office and front sent a teams as well to the nurse to speak to patient.  Patient been waiting a week for someone to get back with her a week now and not getting any patient care.   Cost went up to $ 31.00 for 90 pills she asked for a 30 day suppoly there is no charge  pioglitazone  (ACTOS ) 30 MG tablet [[id number]]   Pharmacy: [facility]

## 2023-12-27 NOTE — Telephone Encounter (Signed)
 Pharmacy Patient Advocate Encounter   Received notification from Onbase that prior authorization for Pioglitazone  HCl 30MG  tablets is required/requested.   Insurance verification completed.   The patient is insured through [employer].   Per test claim: max daily dose of 1 tablet per day  Can the prescription we resent for the 45mg  tablets Take 1 tablet by mouth once daily?

## 2023-12-27 NOTE — Telephone Encounter (Signed)
 Message sent to provider from Prior authorization team

## 2023-12-27 NOTE — Telephone Encounter (Signed)
 FYI Only or Action Required?: Action required by provider: medication refill request. Pt asking to have actos  rx resent as 45 mg tablet instead of 30mg  tablets  Patient was last seen in primary care on 12/11/2023 by Bevely Doffing, FNP.  Called Nurse Triage reporting Medication Problem.  Symptoms began No triage.  Interventions attempted: Other: No triage.  Symptoms are: No triage.  Triage Disposition: Call PCP When Office is Open  Patient/caregiver understands and will follow disposition?: Yes  Copied from CRM #[id number]. Topic: Clinical - Prescription Issue >> Dec 27, 2023 10:33 AM Mia F wrote: Reason for CRM: Pt is at the pharmacy and need the pioglitazone  (ACTOS ) 30 MG tablet script to be corrected. She says she is supposed to take 45MG  and the dr wants her to take one and a half pill but pharmacy says there Is a 45 MG dosage that they can give her with the correct script. The insurance company will not cover it how it is written right now. PT is upset and says this should had been done right when it was sent Reason for Disposition  [1] Caller has NON-URGENT medicine question about med that doctor (or NP/PA) prescribed AND [2] triager unable to answer question    Pt would really like to have corrected rx sent in today  Answer Assessment - Initial Assessment Questions Pt reports just leaving provider office today with scripts being sent in for actos  30 mg, instructions to take 1.5 tablets daily. Pt at pharmacy now who report insurance will not cover the 30 mg tablets, but will cover 45 mg tablets. Pt asking to have script resent for 45 mg tablets. Pt upset and asking to have it sent today as [place] and has to [place] into town to get her medications and would prefer to not have to make a return trip. Sending message to office with medication change request. Confirmed pts phone and pharmacy on file.  1. DRUG NAME: What medicine do you need to have refilled?      Actos   2. REFILLS REMAINING: How many refills are remaining? Notes: The label on the medicine or pill bottle will show how many refills are remaining. If there are no refills remaining, then a renewal may be needed.     *No Answer*  3. EXPIRATION DATE: What is the expiration date? Note: The label states when the prescription will expire, and thus can no longer be refilled.)     *No Answer*  4. PRESCRIBER: Who prescribed it? Note: The prescribing doctor or group is responsible for refill approvals.SABRA Doffing Bevely, FNP  5. PHARMACY: Have you contacted your pharmacy (drugstore)? Note: Some pharmacies will contact the doctor (or NP/PA).      At pharmacy now, reports insurance will cover the 45 mg tablets but not the 30 mg tablets  6. SYMPTOMS: Do you have any symptoms?     *No Answer*  Protocols used: Medication Refill and Renewal Call-A-AH

## 2023-12-29 LAB — INSULIN AND C-PEPTIDE, SERUM
C-Peptide: 6.4 ng/mL — ABNORMAL HIGH (ref 1.1–4.4)
INSULIN: 64 u[IU]/mL — ABNORMAL HIGH (ref 2.6–24.9)

## 2024-01-01 ENCOUNTER — Ambulatory Visit: Payer: Self-pay

## 2024-01-01 NOTE — Telephone Encounter (Signed)
 FYI Only or Action Required?: Action required by provider: lab or test result follow-up needed.  Patient was last seen in primary care on 12/11/2023 by Bevely Doffing, FNP.  Called Nurse Triage reporting Blood Sugar Problem.  Symptoms began n/a.  Interventions attempted: Other: n/a.  Symptoms are: n/a.  Triage Disposition: Call PCP When Office is Open  Patient/caregiver understands and will follow disposition?: Yes Reason for Disposition  [1] Caller requesting NON-URGENT health information AND [2] PCP's office is the best resource  Answer Assessment - Initial Assessment Questions Patient states upon research it has to do with her pancreas and she is concerned. She states its an abnormal result and its been days since results came in and wants to know why no one called her to tell her what's going on. Patient wanting a call from PCP, not a mychart message.   1. REASON FOR CALL: What is the main reason for your call? or How can I best help you?     Patient calling in to discuss the results to Insulin  and C-pepitde   2. SYMPTOMS : Do you have any symptoms?      Denies  Protocols used: Information Only Call - No Triage-A-AH  Copied from CRM [id number]. Topic: Clinical - Red Word Triage >> Jan 01, 2024  4:35 PM Wess RAMAN wrote: Red Word that prompted transfer to Nurse Triage: Patient states her blood sugar number are still reading 300s. It is currently 58

## 2024-01-03 ENCOUNTER — Ambulatory Visit: Payer: Self-pay

## 2024-01-03 ENCOUNTER — Telehealth: Payer: Self-pay

## 2024-01-03 ENCOUNTER — Other Ambulatory Visit: Payer: Self-pay

## 2024-01-03 NOTE — Telephone Encounter (Signed)
 NT attempted x1, no answer, left vm.   Message from Karen Horn sent at 01/03/2024  9:31 AM EST  Reason for Triage: The patient is experiencing uncontrollable diarrhea and reports she went to the bathroom 3x this morning

## 2024-01-03 NOTE — Telephone Encounter (Signed)
 FYI Only or Action Required?: Action required by provider: clinical question for provider.  Patient was last seen in primary care on 12/11/2023 by Bevely Doffing, FNP.  Called Nurse Triage reporting Diarrhea.  Symptoms began today.  Interventions attempted: Nothing.  Symptoms are: unchanged.  Triage Disposition: See Physician Within 24 Hours  Patient/caregiver understands and will follow disposition?: No, wishes to speak with PCP    Reason for Disposition  [1] Symptoms of high blood sugar (e.g., increased thirst, frequent urination, weight loss) AND [2] not able to test blood glucose  Answer Assessment - Initial Assessment Questions Patient will recheck BS at 3 PM, if BS still elevated will call back or go to ED. Advised ED if symptoms not improved or worsens.  Patient requesting call back.  Encourage increase drinking water , patient just ate at 1 PM.   1. BLOOD GLUCOSE: What is your blood glucose level?      300 to 400 lately, had cortisone shot last week for hip pain 2. ONSET: When did you check the blood glucose?     183 last check 1 PM, last ate 1PM, 248bs currently 3. USUAL RANGE: What is your glucose level usually? (e.g., usual fasting morning value, usual evening value)     100-250 4. KETONES: Do you check for ketones (urine or blood test strips)? If Yes, ask: What does the test show now?      no 5. TYPE 1 or 2:  Do you know what type of diabetes you have?  (e.g., Type 1, Type 2, Gestational; doesn't know)      Type 2 6. INSULIN : Do you take insulin ? What type of insulin (s) do you use? What is the mode of delivery? (syringe, pen; injection or pump)?      Insulin ; flex pin 7. DIABETES PILLS: Do you take any pills for your diabetes? If Yes, ask: Have you missed taking any pills recently?     actos  8. OTHER SYMPTOMS: Do you have any symptoms? (e.g., fever, frequent urination, difficulty breathing, dizziness, weakness, vomiting)     Frequent  urination, blurred vision(medication from bladder, but stopped taking these meds and vision still blurred, constant for 2 months),   Denies dizziness, unsteady gait, diff breath, n/v.  Answer Assessment - Initial Assessment Questions 1. DIARRHEA SEVERITY: How bad is the diarrhea? How many more stools have you had in the past 24 hours than normal?      4x 2. ONSET: When did the diarrhea begin?      Months ago; unable to make to restroom in time in morning/throughout day 3. STOOL DESCRIPTION:  How loose or watery is the diarrhea? What is the stool color? Is there any blood or mucous in the stool?     Loose stool, normal color, no blood, mucous 4. VOMITING: Are you also vomiting? If Yes, ask: How many times in the past 24 hours?      no 5. ABDOMEN PAIN: Are you having any abdomen pain? If Yes, ask: What does it feel like? (e.g., crampy, dull, intermittent, constant)      Not currently intermittent 6. ABDOMEN PAIN SEVERITY: If present, ask: How bad is the pain?  (e.g., Scale 1-10; mild, moderate, or severe)     no 7. ORAL INTAKE: If vomiting, Have you been able to drink liquids? How much liquids have you had in the past 24 hours?     Drinking enough fluids 8. HYDRATION: Any signs of dehydration? (e.g., dry mouth [not just dry lips], too weak  to stand, dizziness, new weight loss) When did you last urinate?    denies Dizziness, weakness 9. EXPOSURE: Have you traveled to a foreign country recently? Have you been exposed to anyone with diarrhea? Could you have eaten any food that was spoiled?     no 10. ANTIBIOTIC USE: Are you taking antibiotics now or have you taken antibiotics in the past 2 months?       Yes; taken within past 4 days, last pill today 11. OTHER SYMPTOMS: Do you have any other symptoms? (e.g., fever, blood in stool)     Denies fever, chills, n/v  Protocols used: Diarrhea-A-AH, Diabetes - High Blood Sugar-A-AH

## 2024-01-03 NOTE — Telephone Encounter (Addendum)
 FYI Only or Action Required?: Action required by provider: clinical question for provider, update on patient condition, and lab or test result follow-up needed.   Patient was last seen in primary care on 12/11/2023 by Bevely Doffing, FNP.  Called Nurse Triage reporting Results, Diarrhea, and Blood Sugar Problem.  Symptoms began several weeks ago.  Interventions attempted: Prescription medications: Actos , Tresiba  Insulin , Amoxicillin .  Symptoms are: gradually worsening.  Triage Disposition: Call PCP Now  Patient/caregiver understands and will follow disposition?: Yes  Copied from CRM #[id number]. Topic: Clinical - Lab/Test Results >> Jan 03, 2024  9:11 AM Antwanette L wrote: Reason for CRM: The patient is calling to speak with someone regarding her insulin  and C-peptide results. She received a message indicating that the results were abnormal, but the provider has not yet documented any notes. Patient is requesting a callback at [phone] >> Jan 03, 2024  9:18 AM Antwanette L wrote: The patient is experiencing uncontrollable diarrhea and reports she went to the bathroom 3x this morning  Reason for Disposition  MODERATE diarrhea (e.g., 4-6 times / day more than normal)  Blood glucose > 400 mg/dL (77.7 mmol/L)    CBG 825 now, has been 300-400's daily for past month.  Answer Assessment - Initial Assessment Questions 1. ANTIBIOTIC: What antibiotic are you taking? How many times per day?     Amoxicillin  500 mg TID, has 2 left  2. ANTIBIOTIC ONSET: When was the antibiotic started?     2-3 days ago, pt had some abxs left over from previous dental procedure and took the remaining tablets for cold symptoms. Reports they are improving.  3. DIARRHEA SEVERITY: How bad is the diarrhea? How many more stools have you had in the past 24 hours than normal?      4-5 times  4. ONSET: When did the diarrhea begin?      Off and on for past few weeks  5. BM CONSISTENCY: How loose or  watery is the diarrhea?      Soft  6. VOMITING: Are you also vomiting? If Yes, ask: How many times in the past 24 hours?      Denies  7. ABDOMEN PAIN: Are you having any abdomen pain? If Yes, ask: What does it feel like? (e.g., crampy, dull, intermittent, constant)      Denies  8. ABDOMEN PAIN SEVERITY: If present, ask: How bad is the pain?  (e.g., Scale 1-10; mild, moderate, or severe)     Denies  9. ORAL INTAKE: If vomiting, Have you been able to drink liquids? How much liquids have you had in the past 24 hours?     Drinking fine, doesn't feel dehydrated  10. HYDRATION: Any signs of dehydration? (e.g., dry mouth [not just dry lips], too weak to stand, dizziness, new weight loss) When did you last urinate?       Denies  11. EXPOSURE: Have you traveled to a foreign country recently? Have you been exposed to anyone with diarrhea? Could you have eaten any food that was spoiled?       Denies  12. OTHER SYMPTOMS: Do you have any other symptoms? (e.g., fever, blood in stool)       Starting to have urgency with stool. Denies fever  Answer Assessment - Initial Assessment Questions Pt calling to discuss elevated lab results (insulin  and c-peptide) that resulted on 11/7. Pt noted to have called nurse triage on 11/12 requesting a call back from office to discuss elevated lab results as they have not  yet been interpreted. While speaking with pt reports that her blood sugars have been increasing since her last call to triage on 11/3 for blood sugars in the 200-300's with low readings at night. Recs by provider at that time to increase actos  from 30-45 mg and Tresiba  insulin  from 18 units to 20 units, and to switch insulin  from QAM to at bedtime. Pt reports making these changes (expect that she is taking 22 units instead of 20 units insulin ) but reports blood sugars have gone up and now running 300-400's daily. Blood sugar currently 174. Dexcom sensor calibrated. Reports  blurred vision starting 2 months ago and diarrhea for past few weeks. Taking left-over amoxicillin  from previous dental procedure for recent cold for the past few days. No availability with PCP until March 2026. Called [facility] and spoke with Mitzie to update on pt condition regarding increasing blood sugars, ongoing blurred vision, diarrhea and need for lab interpretation. Nurse not currently available and asked to send message over/confirm pts phone number for a call back today. Advised pt to keep phone nearby and ED for worsening symptoms. Confirmed best call back number for pt is [phone].  1. BLOOD GLUCOSE: What is your blood glucose level?      Blood sugar 174 now per dexcom, has not eaten breakfast yet. Reports fingerstick matches dexcom readings. Reports CBG's have been in the 300-400s daily for the past 3-4 weeks.  2. ONSET: When did you check the blood glucose?     Now  3. USUAL RANGE: What is your glucose level usually? (e.g., usual fasting morning value, usual evening value)     70-189  4. KETONES: Do you check for ketones (urine or blood test strips)? If Yes, ask: What does the test show now?      Denies  5. TYPE 1 or 2:  Do you know what type of diabetes you have?  (e.g., Type 1, Type 2, Gestational; doesn't know)      Type 2  6. INSULIN : Do you take insulin ? What type of insulin (s) do you use? What is the mode of delivery? (syringe, pen; injection or pump)?      Tresiba  22 units at bedtime. Increased from 18 units to 22 units on 11/7 after pt came by the office to speak with office staff.  7. DIABETES PILLS: Do you take any pills for your diabetes? If Yes, ask: Have you missed taking any pills recently?     Actos  45 mg daily, has not missed any doses  8. OTHER SYMPTOMS: Do you have any symptoms? (e.g., fever, frequent urination, difficulty breathing, dizziness, weakness, vomiting)     Frequent urination, chronic. Soft diarrhea past several weeks. Blurred  vision starting 2 months ago after starting medications for urinary frequency. Pt stopped taking them as side effects included blurred vision. Pt reports blurred vision persists. Has not addressed blurred vision with provider yet.  Protocols used: Diarrhea on Antibiotics-A-AH, Diabetes - High Blood Sugar-A-AH

## 2024-01-03 NOTE — Telephone Encounter (Signed)
 Call back # [phone]

## 2024-01-03 NOTE — Telephone Encounter (Signed)
 error

## 2024-01-03 NOTE — Telephone Encounter (Signed)
 Patient called still waiting on blood work results from 12/27/2023 and her blood sugar being 200-300 and the insulin  no return call to patient as of today. Today her blood sugar is  higher now 300-400 today. Patient having blur vision for the past 2 months. Patient requesting a call back from the nurse/provider.

## 2024-01-03 NOTE — Telephone Encounter (Signed)
 Pt advised of blood work

## 2024-01-03 NOTE — Telephone Encounter (Signed)
 Her recent labs show that she continues to have type 2 diabetes and that her pancreas is still functioning.  If her C-peptide level was low that would be an indication of type 1 diabetes or little to no pancreatic function.  I just wanted to check these labs since her blood sugars have not been responding well to her medications.  These labs are consistent with type 2 diabetes.

## 2024-01-10 ENCOUNTER — Telehealth: Payer: Self-pay

## 2024-01-10 NOTE — Telephone Encounter (Signed)
 DISREGARD

## 2024-01-10 NOTE — Telephone Encounter (Signed)
 Just spoke to pt and advised her Leita gave verbal orders to them over the phone today she is calling to see if it is there now

## 2024-01-10 NOTE — Telephone Encounter (Signed)
 Copied from CRM [id number]. Topic: Clinical - Prescription Issue >> Jan 10, 2024 11:34 AM Rosaria BRAVO wrote: Reason for CRM: Pt's wife called requesting to have the correct prescription submitted for the patient's cough syrup combination.   Says that she does not know the name of the syrup because it is one that the pharmacy has to mix up themselves. Please advise   [facility] [place], [place] - [address] [place] [address] Phone: [phone] Fax: [phone]

## 2024-01-23 ENCOUNTER — Ambulatory Visit: Payer: Self-pay

## 2024-01-23 NOTE — Telephone Encounter (Signed)
 Called patient will call back first of week

## 2024-01-23 NOTE — Telephone Encounter (Signed)
 FYI Only or Action Required?: Action required by provider: request for appointment, clinical question for provider, and Request work in today or meds to pharmacy, requesting CALL back from clinic today with pcp recommendations.  Patient was last seen in primary care on 12/11/2023 by Bevely Doffing, FNP.  Called Nurse Triage reporting Cough.  Symptoms began several weeks ago.  Interventions attempted: Rest, hydration, or home remedies.  Symptoms are: gradually worsening.  Triage Disposition: See HCP Within 4 Hours (Or PCP Triage)  Patient/caregiver understands and will follow disposition?: No, wishes to speak with PCP   Copied from CRM #[id number]. Topic: Clinical - Red Word Triage >> Jan 23, 2024  2:26 PM Karen Horn wrote: Red Word that prompted transfer to Nurse Triage: Sinus draining and coughing up yellow/green stuff. No fever. Pt believes that is she does not get medication to treat this, it will results in bronchitis. Pt also has high sugar 300 something last night, this morning 149-159, abdominal pain and feeling nauseous Reason for Disposition  [1] MILD difficulty breathing (e.g., minimal/no SOB at rest, SOB with walking, pulse < 100) AND [2] still present when not coughing  Answer Assessment - Initial Assessment Questions Additional info: Requesting medication for cough X 2 weeks, feels like congestion is building, worried it will develop into bronchitis like she gets every year and requesting antibiotics or clinic appointment within one hour. No appointments available. Requesting antibiotic and cough medicine to be sent to pharmacy. She is going to town now and would like script called in within the hour, she does not plan to go out again due to weather.  Advised urgent care but she declines, I might think about it,  But what I really want is medication sent to pharmacy. If she can't do it then just forget about it. Advised high priority message would be sent to pcp for  recommendation.  Requesting CALL back from clinical staff today.  Patient upset no appointment are available in clinic. Can she be worked in today?    1. ONSET: When did the cough begin?      2 weeks  2. SEVERITY: How bad is the cough today?      Turned productive today  3. SPUTUM: Describe the color of your sputum (e.g., none, dry cough; clear, white, yellow, green)     Yellow, green 4. HEMOPTYSIS: Are you coughing up any blood? If Yes, ask: How much? (e.g., flecks, streaks, tablespoons, etc.)     denies 5. DIFFICULTY BREATHING: Are you having difficulty breathing? If Yes, ask: How bad is it? (e.g., mild, moderate, severe)      Not worse than usual, shortness of breath at night, need to prop self up on pillows.  6. FEVER: Do you have a fever? If Yes, ask: What is your temperature, how was it measured, and when did it start?     Denies  7. CARDIAC HISTORY: Do you have any history of heart disease? (e.g., heart attack, congestive heart failure)       8. LUNG HISTORY: Do you have any history of lung disease?  (e.g., pulmonary embolus, asthma, emphysema)     Bronchitis  9. PE RISK FACTORS: Do you have a history of blood clots? (or: recent major surgery, recent prolonged travel, bedridden)      10. OTHER SYMPTOMS: Do you have any other symptoms? (e.g., runny nose, wheezing, chest pain)       Vomiting after eating  11. PREGNANCY: Is there any chance you are pregnant? When  was your last menstrual period?        12. TRAVEL: Have you traveled out of the country in the last month? (e.g., travel history, exposures)  Protocols used: Cough - Acute Productive-A-AH

## 2024-01-24 ENCOUNTER — Other Ambulatory Visit: Payer: Self-pay

## 2024-01-24 ENCOUNTER — Encounter: Payer: Self-pay | Admitting: Emergency Medicine

## 2024-01-24 ENCOUNTER — Ambulatory Visit (INDEPENDENT_AMBULATORY_CARE_PROVIDER_SITE_OTHER)

## 2024-01-24 ENCOUNTER — Ambulatory Visit
Admission: EM | Admit: 2024-01-24 | Discharge: 2024-01-24 | Disposition: A | Discharge status: Home / Self Care | Attending: Student | Admitting: Student

## 2024-01-24 DIAGNOSIS — E1169 Type 2 diabetes mellitus with other specified complication: Secondary | ICD-10-CM

## 2024-01-24 DIAGNOSIS — R059 Cough, unspecified: Secondary | ICD-10-CM | POA: Diagnosis not present

## 2024-01-24 DIAGNOSIS — J209 Acute bronchitis, unspecified: Secondary | ICD-10-CM | POA: Diagnosis not present

## 2024-01-24 DIAGNOSIS — Z794 Long term (current) use of insulin: Secondary | ICD-10-CM | POA: Diagnosis not present

## 2024-01-24 MED ORDER — ALBUTEROL SULFATE HFA 108 (90 BASE) MCG/ACT IN AERS: 1.0000 | INHALATION_SPRAY | Freq: Four times a day (QID) | RESPIRATORY_TRACT | 0 refills | Status: AC | PRN

## 2024-01-24 MED ORDER — BENZONATATE 100 MG PO CAPS: 100.0000 mg | ORAL_CAPSULE | Freq: Three times a day (TID) | ORAL | 0 refills | Status: AC

## 2024-01-24 NOTE — ED Provider Notes (Signed)
 [facility]    CSN: [id number] Arrival date & time: 01/24/24  1155      History   Chief Complaint Chief Complaint  Patient presents with   Cough    HPI Karen Horn is a 78 y.o. female presenting with cough for weeks.  Pt reports productive cough (greenish/yellow) that is worse at night, sore throat, hoarse voice for last several weeks, fatigue. PND causing nausea. Unable to see PCP.  Denies shortness of breath.  Reports has tried tussinex, alka seltzer cold with minimal effect on symptoms. Denies any known fever. No history pulm ds. Never smoker. Mentions charlie horses during the night for 10 days.   She is a diabetic. Sugars running 300s+ at night.    HPI  Past Medical History:  Diagnosis Date   Diabetes mellitus without complication (HCC)    Hypothyroidism    Thyroid  disease     Patient Active Problem List   Diagnosis Date Noted   Major depressive disorder, recurrent episode, moderate degree (HCC) 12/11/2023   Tremor 06/20/2023   OP (osteoporosis) 04/15/2023   Osteoarthritis of right hip 03/21/2023   Subacute maxillary sinusitis 03/04/2023   Mixed stress and urge urinary incontinence 09/13/2022   Hyperlipidemia associated with type 2 diabetes mellitus (HCC) 01/17/2022   Anxiety and depression 01/17/2022   Stress incontinence, female 01/17/2022   HSV (herpes simplex virus) infection 01/17/2022   Dysphagia 12/14/2021   Throat clearing 12/14/2021   Reactive arthropathy of hip (HCC) 10/12/2021   Irritable bowel syndrome with diarrhea 08/14/2021   Localized osteoporosis without pathological fracture 06/19/2021   Menopausal and postmenopausal disorder 09/29/2020   Vitamin D  deficiency 09/29/2020   Pain of both hip joints 09/29/2020   GERD (gastroesophageal reflux disease) 09/20/2020   Hypothyroidism 09/20/2020   Hyperglycemia due to type 2 diabetes mellitus (HCC) 08/03/2020   Seasonal and perennial allergic rhinitis 01/10/2020   History of lumbar  surgery 10/01/2019   TMJ syndrome 03/19/2018   Restless legs 07/10/2016   Iron deficiency anemia 06/27/2016   Osteopenia of multiple sites 06/22/2015   Type 2 diabetes mellitus (HCC) 08/26/2014   DDD (degenerative disc disease), lumbosacral 07/08/2012   Statin intolerance 10/04/2011   Breast calcification, left 09/22/2010   Mitral regurgitation 09/22/2010    Past Surgical History:  Procedure Laterality Date   ABDOMINAL HYSTERECTOMY     ADENOIDECTOMY     BACK SURGERY     BIOPSY  09/06/2021   Procedure: BIOPSY;  Surgeon: Eartha Angelia Sieving, MD;  Location: [facility];  Service: Gastroenterology;;   ESOPHAGOGASTRODUODENOSCOPY (EGD) WITH PROPOFOL  N/A 09/06/2021   Procedure: ESOPHAGOGASTRODUODENOSCOPY (EGD) WITH PROPOFOL ;  Surgeon: Eartha Angelia Sieving, MD;  Location: [facility];  Service: Gastroenterology;  Laterality: N/A;  215 ASA 2   KNEE ARTHROSCOPY WITH MEDIAL MENISECTOMY Left 02/27/2022   Procedure: KNEE ARTHROSCOPY WITH MEDIAL MENISCECTOMY;  Surgeon: Margrette Taft BRAVO, MD;  Location: [facility];  Service: Orthopedics;  Laterality: Left;   SINOSCOPY     TONSILLECTOMY     TOTAL HIP ARTHROPLASTY Bilateral     OB History   No obstetric history on file.      Home Medications    Prior to Admission medications   Medication Sig Start Date End Date Taking? Authorizing Provider  ACCU-CHEK AVIVA PLUS test strip USE AS DIRECTED TO TEST THREE TIMES DAILY. 11/05/23   Bevely Doffing, FNP  albuterol  (VENTOLIN  HFA) 108 (90 Base) MCG/ACT inhaler Inhale 1-2 puffs into the lungs every 6 (six) hours as needed for  wheezing or shortness of breath. 01/24/24  Yes Arlyss Leita BRAVO, PA-C  aspirin  81 MG chewable tablet Chew 81 mg by mouth in the morning.    [provider]  atorvastatin  (LIPITOR) 40 MG tablet TAKE 1 TABLET BY MOUTH ONCE A DAY 05/30/23   Melvenia Manus BRAVO, MD  benzonatate  (TESSALON ) 100 MG capsule Take 1 capsule (100 mg total) by mouth every 8 (eight) hours.  01/24/24  Yes Arlyss Leita BRAVO, PA-C  Blood Glucose Monitoring Suppl (ACCU-CHEK AVIVA PLUS) w/Device KIT by Does not apply route. 07/03/18   [provider]  cholecalciferol (VITAMIN D ) 25 MCG (1000 UNIT) tablet Take 2,000 Units by mouth in the morning.    [provider]  co-enzyme Q-10 30 MG capsule Take 30 mg by mouth 3 (three) times daily.    [provider]  Continuous Glucose Receiver (DEXCOM G7 RECEIVER) DEVI 1 Device by Does not apply route continuous. 09/13/22   Melvenia Manus BRAVO, MD  Continuous Glucose Sensor (DEXCOM G7 SENSOR) MISC 1 each by Does not apply route every 14 (fourteen) days. 12/26/23   Bevely Leita, FNP  cyclobenzaprine (FLEXERIL) 10 MG tablet Take 10 mg by mouth 3 (three) times daily as needed. 10/02/23   [provider]  EPINEPHrine  0.3 mg/0.3 mL IJ SOAJ injection epinephrine  0.3 mg/0.3 mL injection, auto-injector Patient taking differently: Inject 0.3 mg into the muscle as needed for anaphylaxis. 08/16/21   Cheryl Reusing, FNP  insulin  degludec (TRESIBA  FLEXTOUCH) 200 UNIT/ML FlexTouch Pen Inject 18 Units into the skin in the morning. 10/09/23   Bevely Leita, FNP  Insulin  Pen Needle (SURE COMFORT PEN NEEDLES) 31G X 5 MM MISC Sure Comfort Pen Needle 31 gauge x 5/16 04/17/22   Melvenia Manus BRAVO, MD  levothyroxine  (SYNTHROID ) 75 MCG tablet Take 1 tablet (75 mcg total) by mouth daily before breakfast. 10/09/23 04/06/24  Bevely Leita, FNP  omeprazole  (PRILOSEC) 20 MG capsule Take 1 capsule (20 mg total) by mouth daily. 12/11/23   Bevely Leita, FNP  oxyCODONE  (OXY IR/ROXICODONE ) 5 MG immediate release tablet TAKE 1-2 TABLETS BY MOUTH EVERY 6 HOURS AS NEEDED FOR SEVERE POSTOP PAIN    [provider]  pioglitazone  (ACTOS ) 45 MG tablet Take 1 tablet (45 mg total) by mouth daily. 12/27/23   Bevely Leita, FNP  promethazine -dextromethorphan (PROMETHAZINE -DM) 6.25-15 MG/5ML syrup Take 5 mLs by mouth 4 (four) times daily as needed. 03/04/23    Bacchus, Gloria Z, FNP  rOPINIRole  (REQUIP ) 1 MG tablet Take 1 tablet (1 mg total) by mouth 3 (three) times daily. 08/26/23 12/12/23  Bevely Leita, FNP  sertraline  (ZOLOFT ) 25 MG tablet Take 1 tablet (25 mg total) by mouth daily. 06/20/23   Melvenia Manus BRAVO, MD  trospium  (SANCTURA ) 20 MG tablet Take 1 tablet (20 mg total) by mouth at bedtime. 10/29/23   Bevely Leita, FNP  valACYclovir  (VALTREX ) 500 MG tablet TAKE ONE TABLET BY MOUTH EVERY DAY 12/12/23   Bevely Leita, FNP    Family History History reviewed. No pertinent family history.  Social History Social History   Tobacco Use   Smoking status: Never   Smokeless tobacco: Never  Vaping Use   Vaping status: Never Used  Substance Use Topics   Alcohol use: Not Currently   Drug use: Never     Allergies   Codeine, Gabapentin, Sulfa antibiotics, Atorvastatin  calcium , Escitalopram, Ezetimibe, Fluvastatin, Fosamax [alendronate], Levofloxacin, Pramipexole, Prednisone, Rosuvastatin calcium , Simvastatin, Sulfur , and Trazodone and nefazodone   Review of Systems Review of Systems  Constitutional:  Negative for appetite change, chills and fever.  HENT:  Positive for congestion. Negative for ear pain, rhinorrhea, sinus pressure, sinus pain and sore throat.   Eyes:  Negative for redness and visual disturbance.  Respiratory:  Positive for cough. Negative for chest tightness, shortness of breath and wheezing.   Cardiovascular:  Negative for chest pain and palpitations.  Gastrointestinal:  Negative for abdominal pain, constipation, diarrhea, nausea and vomiting.  Genitourinary:  Negative for dysuria, frequency and urgency.  Musculoskeletal:  Negative for myalgias.  Neurological:  Negative for dizziness, weakness and headaches.  Psychiatric/Behavioral:  Negative for confusion.   All other systems reviewed and are negative.    Physical Exam Triage Vital Signs ED Triage Vitals  Encounter Vitals Group     BP --      Girls Systolic BP  Percentile --      Girls Diastolic BP Percentile --      Boys Systolic BP Percentile --      Boys Diastolic BP Percentile --      Pulse --      Resp 01/24/24 1215 20     Temp --      Temp Source 01/24/24 1215 Oral     SpO2 --      Weight --      Height --      Head Circumference --      Peak Flow --      Pain Score 01/24/24 1213 0     Pain Loc --      Pain Education --      Exclude from Growth Chart --    No data found.  Updated Vital Signs BP (!) 149/84 (BP Location: Right Arm)   Pulse 67   Temp 98 F (36.7 C) (Oral)   Resp 20   SpO2 94%   Visual Acuity Right Eye Distance:   Left Eye Distance:   Bilateral Distance:    Right Eye Near:   Left Eye Near:    Bilateral Near:     Physical Exam Vitals reviewed.  Constitutional:      General: She is not in acute distress.    Appearance: Normal appearance. She is not ill-appearing.  HENT:     Head: Normocephalic and atraumatic.     Right Ear: Tympanic membrane, ear canal and external ear normal. No tenderness. No middle ear effusion. There is no impacted cerumen. Tympanic membrane is not perforated, erythematous, retracted or bulging.     Left Ear: Tympanic membrane, ear canal and external ear normal. No tenderness.  No middle ear effusion. There is no impacted cerumen. Tympanic membrane is not perforated, erythematous, retracted or bulging.     Nose: Nose normal. No congestion.     Right Sinus: No maxillary sinus tenderness or frontal sinus tenderness.     Left Sinus: No maxillary sinus tenderness or frontal sinus tenderness.     Mouth/Throat:     Mouth: Mucous membranes are moist.     Pharynx: Uvula midline. No oropharyngeal exudate or posterior oropharyngeal erythema.     Tonsils: No tonsillar exudate.     Comments: Tonsils surgically absent Eyes:     Extraocular Movements: Extraocular movements intact.     Pupils: Pupils are equal, round, and reactive to light.  Cardiovascular:     Rate and Rhythm: Normal rate and  regular rhythm.     Heart sounds: Normal heart sounds.  Pulmonary:     Effort: Pulmonary effort is normal.     Breath  sounds: Normal breath sounds. No decreased breath sounds, wheezing, rhonchi or rales.     Comments: Occasional dry cough Abdominal:     Palpations: Abdomen is soft.     Tenderness: There is no abdominal tenderness. There is no guarding or rebound.  Lymphadenopathy:     Cervical: No cervical adenopathy.     Right cervical: No superficial, deep or posterior cervical adenopathy.    Left cervical: No superficial, deep or posterior cervical adenopathy.  Skin:    Comments: No rash   Neurological:     General: No focal deficit present.     Mental Status: She is alert and oriented to person, place, and time.  Psychiatric:        Mood and Affect: Mood normal.        Behavior: Behavior normal.        Thought Content: Thought content normal.        Judgment: Judgment normal.      UC Treatments / Results  Labs (all labs ordered are listed, but only abnormal results are displayed) Labs Reviewed  BASIC METABOLIC PANEL WITH GFR    EKG   Radiology No results found.  Procedures Procedures (including critical care time)  Medications Ordered in UC Medications - No data to display  Initial Impression / Assessment and Plan / UC Course  I have reviewed the triage vital signs and the nursing notes.  Pertinent labs & imaging results that were available during my care of the patient were reviewed by me and considered in my medical decision making (see chart for details).     Patient is a pleasant 78 y.o. female presenting with bronchitis. The patient is afebrile and nontachycardic.  Antipyretic has not been administered today.  On exam, lungs are clear to auscultation. She does not have a history of pulmonary disease.  CXR  1. No acute cardiopulmonary process. 2. Thoracic spondylosis.  Did not check a covid or influenza test due to duration of symptoms.  Diabetes -  sugars running 300s+ at home. Not able to prescribe steroid today. I sent an albuterol  inhaler, and Tessalon  Perles. Continue Tussinex which she has at home already.  We discussed that an antibiotic is not indicated, as she does not have a sinus or lung infection.  Return precautions as below.  Checking a BMP due to increase in leg cramps over the last week.   Return precautions as below.   Final Clinical Impressions(s) / UC Diagnoses   Final diagnoses:  Cough, unspecified type  Type 2 diabetes mellitus with other specified complication, unspecified whether long term insulin  use [facility])     Discharge Instructions      -Your xray looks normal to me. I will call with any abnormal results in 1-2 hours; I will not call with normal results.  -Tessalon  (Benzonatate ) as needed for cough. Take one pill up to 3x daily (every 8 hours) -Albuterol  inhaler as needed for cough, wheezing, shortness of breath, 1 to 2 puffs every 6 hours as needed. -Continue the medications that you have at home if they are helping -Your cough should slowly get better instead of worse. If you develop a cough productive of dark or red sputum, new shortness of breath, new chest tightness, new fevers, etc - seek additional care.     ED Prescriptions     Medication Sig Dispense Auth. Provider   benzonatate  (TESSALON ) 100 MG capsule Take 1 capsule (100 mg total) by mouth every 8 (eight) hours. 21 capsule  Orson Rho E, PA-C   albuterol  (VENTOLIN  HFA) 108 (90 Base) MCG/ACT inhaler Inhale 1-2 puffs into the lungs every 6 (six) hours as needed for wheezing or shortness of breath. 1 each Arlyss Leita BRAVO, PA-C      PDMP not reviewed this encounter.   Arlyss Leita BRAVO, PA-C 01/24/24 1312

## 2024-01-24 NOTE — Discharge Instructions (Addendum)
-  Your xray looks normal to me. I will call with any abnormal results in 1-2 hours; I will not call with normal results.  -Tessalon  (Benzonatate ) as needed for cough. Take one pill up to 3x daily (every 8 hours) -Albuterol  inhaler as needed for cough, wheezing, shortness of breath, 1 to 2 puffs every 6 hours as needed. -Continue the medications that you have at home if they are helping -Your cough should slowly get better instead of worse. If you develop a cough productive of dark or red sputum, new shortness of breath, new chest tightness, new fevers, etc - seek additional care.

## 2024-01-24 NOTE — ED Triage Notes (Signed)
 Pt reports productive cough, sore throat, hoarse voice for last several weeks. Unable to see PCP. Reports has tried tussinex, alka seltzer cold with minimal effect on symptoms. Denies any known fever.

## 2024-01-25 LAB — BASIC METABOLIC PANEL WITH GFR
BUN/Creatinine Ratio: 21 (ref 12–28)
BUN: 16 mg/dL (ref 8–27)
CO2: 21 mmol/L (ref 20–29)
Calcium: 10.8 mg/dL — ABNORMAL HIGH (ref 8.7–10.3)
Chloride: 100 mmol/L (ref 96–106)
Creatinine, Ser: 0.75 mg/dL (ref 0.57–1.00)
Glucose: 99 mg/dL (ref 70–99)
Potassium: 4.7 mmol/L (ref 3.5–5.2)
Sodium: 137 mmol/L (ref 134–144)
eGFR: 82 mL/min/1.73 (ref >59)

## 2024-01-27 ENCOUNTER — Ambulatory Visit (HOSPITAL_COMMUNITY): Payer: Self-pay

## 2024-02-07 ENCOUNTER — Other Ambulatory Visit: Payer: Self-pay

## 2024-02-07 ENCOUNTER — Ambulatory Visit (HOSPITAL_COMMUNITY)
Admission: RE | Admit: 2024-02-07 | Discharge: 2024-02-07 | Disposition: A | Discharge status: Home / Self Care | Source: Ambulatory Visit

## 2024-02-07 ENCOUNTER — Inpatient Hospital Stay (HOSPITAL_COMMUNITY): Admission: RE | Admit: 2024-02-07 | Discharge: 2024-02-07

## 2024-02-07 DIAGNOSIS — Z780 Asymptomatic menopausal state: Secondary | ICD-10-CM | POA: Insufficient documentation

## 2024-02-07 DIAGNOSIS — Z1231 Encounter for screening mammogram for malignant neoplasm of breast: Secondary | ICD-10-CM | POA: Diagnosis present

## 2024-02-07 DIAGNOSIS — Z0000 Encounter for general adult medical examination without abnormal findings: Secondary | ICD-10-CM

## 2024-02-11 ENCOUNTER — Ambulatory Visit: Payer: Self-pay

## 2024-02-11 ENCOUNTER — Other Ambulatory Visit (HOSPITAL_COMMUNITY): Payer: Self-pay

## 2024-02-11 DIAGNOSIS — R928 Other abnormal and inconclusive findings on diagnostic imaging of breast: Secondary | ICD-10-CM

## 2024-02-11 NOTE — Telephone Encounter (Signed)
Noted patient scheduled

## 2024-02-11 NOTE — Telephone Encounter (Signed)
 FYI Only or Action Required?: FYI only for provider: appointment scheduled on 02/12/2024.  Patient was last seen in primary care on 12/11/2023 by Bevely Doffing, FNP.  Called Nurse Triage reporting Nausea.  Symptoms began about one year ago.  Interventions attempted: Rest, hydration, or home remedies.  Symptoms are: unchanged.  Triage Disposition: See PCP When Office is Open (Within 3 Days)  Patient/caregiver understands and will follow disposition?: Yes  Copied from CRM #[id number]. Topic: Clinical - Red Word Triage >> Feb 11, 2024  9:07 AM Joesph NOVAK wrote: Red Word that prompted transfer to Nurse Triage: taking rOPINIRole  (REQUIP ) 1 MG tablet [[id number]] now she's nauseated and throwing up when she takes it. Been on this medication for awhile. Reason for Disposition  Nausea lasts > 1 week  Answer Assessment - Initial Assessment Questions 1. NAUSEA SEVERITY: How bad is the nausea? (e.g., mild, moderate, severe; dehydration, weight loss)     Reports nausea and sometimes vomiting for about the past year 2. ONSET: When did the nausea begin?     About a year ago 3. VOMITING: Any vomiting? If Yes, ask: How many times today?     Occasional vomiting  5. CAUSE: What do you think is causing the nausea?     Patient is concerned that it is related to taking ropinerole. Takes ropinerole at 7 pm and then feels bloated and nauseated and sometimes vomits  Protocols used: Nausea-A-AH

## 2024-02-12 ENCOUNTER — Ambulatory Visit: Payer: Self-pay | Admitting: Internal Medicine

## 2024-02-12 ENCOUNTER — Encounter: Payer: Self-pay | Admitting: Internal Medicine

## 2024-02-12 VITALS — BP 126/68 | HR 80 | Ht 60.0 in | Wt 110.0 lb

## 2024-02-12 DIAGNOSIS — K580 Irritable bowel syndrome with diarrhea: Secondary | ICD-10-CM

## 2024-02-12 DIAGNOSIS — E1165 Type 2 diabetes mellitus with hyperglycemia: Secondary | ICD-10-CM

## 2024-02-12 DIAGNOSIS — K3184 Gastroparesis: Secondary | ICD-10-CM

## 2024-02-12 DIAGNOSIS — E1143 Type 2 diabetes mellitus with diabetic autonomic (poly)neuropathy: Secondary | ICD-10-CM | POA: Insufficient documentation

## 2024-02-12 DIAGNOSIS — Z794 Long term (current) use of insulin: Secondary | ICD-10-CM | POA: Diagnosis not present

## 2024-02-12 DIAGNOSIS — K219 Gastro-esophageal reflux disease without esophagitis: Secondary | ICD-10-CM

## 2024-02-12 MED ORDER — METOCLOPRAMIDE HCL 5 MG PO TABS: 5.0000 mg | ORAL_TABLET | Freq: Three times a day (TID) | ORAL | 1 refills | Status: AC | PRN

## 2024-02-12 MED ORDER — OMEPRAZOLE 20 MG PO CPDR: 20.0000 mg | DELAYED_RELEASE_CAPSULE | Freq: Two times a day (BID) | ORAL | 1 refills | Status: AC

## 2024-02-12 NOTE — Assessment & Plan Note (Signed)
 Previous gastric emptying study showed mild gastroparesis Recently her type II DM has been uncontrolled as well Started Reglan  for gastroparesis Increased dose of omeprazole  to 20 mg BID for GERD

## 2024-02-12 NOTE — Assessment & Plan Note (Signed)
 Lab Results  Component Value Date   HGBA1C 8.0 (H) 12/11/2023   Uncontrolled, awaiting endocrinology evaluation On Tresiba  18 units nightly and Actos  currently Advised to follow diabetic diet On statin  She has concern about early morning hypoglycemia - advised to have dinner around 7 PM to avoid prolonged fasting (usually takes dinner at 5 PM and wakes up at 7 AM in the morning)

## 2024-02-12 NOTE — Patient Instructions (Signed)
 Please start taking Omeprazole  20 mg twice daily.  Please take Reglan  twice daily for 1 week. Please take Reglan  as needed for nausea after first week.

## 2024-02-12 NOTE — Assessment & Plan Note (Signed)
 Uncontrolled Epigastric discomfort and nausea could be related to GERD and/or gastroparesis Avoid hot and spicy foods Started Reglan  for gastroparesis Increased dose of omeprazole  to 20 mg BID for GERD

## 2024-02-12 NOTE — Assessment & Plan Note (Signed)
 Can take Imodium as needed for diarrhea Maintain adequate hydration and eat at regular intervals Keep food diary If persistent or worsening symptoms, may need GI evaluation again

## 2024-02-12 NOTE — Progress Notes (Signed)
 "  Acute Office Visit  Subjective:    Patient ID: Karen Horn, female    DOB: 10-Dec-1945, 78 y.o.   MRN: [id number]  Chief Complaint  Patient presents with   Nausea    Pt reports sx of nausea and vomitting x 1 year.    HPI Patient is in today for complaint of chronic nausea and vomiting, especially worse in the evening.  She also reports bloating and abdominal discomfort in the evening.  She is currently taking omeprazole  20 mg QD for GERD.  Chart review suggests history of gastroparesis, was evaluated by GI in the past.  Denies melena or hematochezia currently.  She also has history of IBS, but mostly loose BM.  She has a history of type II DM, currently taking Tresiba  18 units nightly and Actos . Her last HbA1c was 8.0 in 10/25.  She is planned to see endocrinology.  Past Medical History:  Diagnosis Date   Diabetes mellitus without complication (HCC)    Hypothyroidism    Thyroid  disease     Past Surgical History:  Procedure Laterality Date   ABDOMINAL HYSTERECTOMY     ADENOIDECTOMY     BACK SURGERY     BIOPSY  09/06/2021   Procedure: BIOPSY;  Surgeon: Eartha Angelia Sieving, MD;  Location: [facility];  Service: Gastroenterology;;   ESOPHAGOGASTRODUODENOSCOPY (EGD) WITH PROPOFOL  N/A 09/06/2021   Procedure: ESOPHAGOGASTRODUODENOSCOPY (EGD) WITH PROPOFOL ;  Surgeon: Eartha Angelia Sieving, MD;  Location: [facility];  Service: Gastroenterology;  Laterality: N/A;  215 ASA 2   KNEE ARTHROSCOPY WITH MEDIAL MENISECTOMY Left 02/27/2022   Procedure: KNEE ARTHROSCOPY WITH MEDIAL MENISCECTOMY;  Surgeon: Margrette Taft BRAVO, MD;  Location: [facility];  Service: Orthopedics;  Laterality: Left;   SINOSCOPY     TONSILLECTOMY     TOTAL HIP ARTHROPLASTY Bilateral     History reviewed. No pertinent family history.  Social History   Socioeconomic History   Marital status: Married    Spouse name: Not on file   Number of children: Not on file   Years of education: Not on file    Highest education level: Not on file  Occupational History   Not on file  Tobacco Use   Smoking status: Never   Smokeless tobacco: Never  Vaping Use   Vaping status: Never Used  Substance and Sexual Activity   Alcohol use: Not Currently   Drug use: Never   Sexual activity: Not Currently  Other Topics Concern   Not on file  Social History Narrative   Not on file   Social Drivers of Health   Tobacco Use: Low Risk (02/12/2024)   Patient History    Smoking Tobacco Use: Never    Smokeless Tobacco Use: Never    Passive Exposure: Not on file  Financial Resource Strain: Low Risk (12/12/2023)   Overall Financial Resource Strain (CARDIA)    Difficulty of Paying Living Expenses: Not hard at all  Food Insecurity: No Food Insecurity (12/12/2023)   Epic    Worried About Radiation Protection Practitioner of Food in the Last Year: Never true    Ran Out of Food in the Last Year: Never true  Transportation Needs: No Transportation Needs (12/12/2023)   Epic    Lack of Transportation (Medical): No    Lack of Transportation (Non-Medical): No  Physical Activity: Sufficiently Active (12/12/2023)   Exercise Vital Sign    Days of Exercise per Week: 7 days    Minutes of Exercise per Session: 30 min  Stress: No  Stress Concern Present (12/12/2023)   [employer] of Occupational Health - Occupational Stress Questionnaire    Feeling of Stress: Not at all  Social Connections: Socially Integrated (12/12/2023)   Social Connection and Isolation Panel    Frequency of Communication with Friends and Family: More than three times a week    Frequency of Social Gatherings with Friends and Family: Once a week    Attends Religious Services: More than 4 times per year    Active Member of Clubs or Organizations: Yes    Attends Banker Meetings: More than 4 times per year    Marital Status: Married  Catering Manager Violence: Not At Risk (12/12/2023)   Epic    Fear of Current or Ex-Partner: No    Emotionally  Abused: No    Physically Abused: No    Sexually Abused: No  Depression (PHQ2-9): Low Risk (12/12/2023)   Depression (PHQ2-9)    PHQ-2 Score: 0  Recent Concern: Depression (PHQ2-9) - Medium Risk (09/20/2023)   Depression (PHQ2-9)    PHQ-2 Score: 5  Alcohol Screen: Low Risk (12/12/2023)   Alcohol Screen    Last Alcohol Screening Score (AUDIT): 0  Housing: Low Risk (12/12/2023)   Epic    Unable to Pay for Housing in the Last Year: No    Number of Times Moved in the Last Year: 0    Homeless in the Last Year: No  Utilities: Not At Risk (12/12/2023)   Epic    Threatened with loss of utilities: No  Health Literacy: Adequate Health Literacy (12/12/2023)   B1300 Health Literacy    Frequency of need for help with medical instructions: Never    Outpatient Medications Prior to Visit  Medication Sig Dispense Refill   ACCU-CHEK AVIVA PLUS test strip USE AS DIRECTED TO TEST THREE TIMES DAILY. 100 strip 10   albuterol  (VENTOLIN  HFA) 108 (90 Base) MCG/ACT inhaler Inhale 1-2 puffs into the lungs every 6 (six) hours as needed for wheezing or shortness of breath. 1 each 0   aspirin  81 MG chewable tablet Chew 81 mg by mouth in the morning.     atorvastatin  (LIPITOR) 40 MG tablet TAKE 1 TABLET BY MOUTH ONCE A DAY 90 tablet 3   benzonatate  (TESSALON ) 100 MG capsule Take 1 capsule (100 mg total) by mouth every 8 (eight) hours. 21 capsule 0   Blood Glucose Monitoring Suppl (ACCU-CHEK AVIVA PLUS) w/Device KIT by Does not apply route.     cholecalciferol (VITAMIN D ) 25 MCG (1000 UNIT) tablet Take 2,000 Units by mouth in the morning.     co-enzyme Q-10 30 MG capsule Take 30 mg by mouth 3 (three) times daily.     Continuous Glucose Receiver (DEXCOM G7 RECEIVER) DEVI 1 Device by Does not apply route continuous. 1 each 0   Continuous Glucose Sensor (DEXCOM G7 SENSOR) MISC 1 each by Does not apply route every 14 (fourteen) days. 3 each 5   cyclobenzaprine (FLEXERIL) 10 MG tablet Take 10 mg by mouth 3 (three)  times daily as needed.     EPINEPHrine  0.3 mg/0.3 mL IJ SOAJ injection epinephrine  0.3 mg/0.3 mL injection, auto-injector (Patient taking differently: Inject 0.3 mg into the muscle as needed for anaphylaxis.) 1 each 1   insulin  degludec (TRESIBA  FLEXTOUCH) 200 UNIT/ML FlexTouch Pen Inject 18 Units into the skin in the morning. 9 mL 3   Insulin  Pen Needle (SURE COMFORT PEN NEEDLES) 31G X 5 MM MISC Sure Comfort Pen Needle 31 gauge x 5/16  30 each 0   levothyroxine  (SYNTHROID ) 75 MCG tablet Take 1 tablet (75 mcg total) by mouth daily before breakfast. 90 tablet 3   oxyCODONE  (OXY IR/ROXICODONE ) 5 MG immediate release tablet TAKE 1-2 TABLETS BY MOUTH EVERY 6 HOURS AS NEEDED FOR SEVERE POSTOP PAIN     pioglitazone  (ACTOS ) 45 MG tablet Take 1 tablet (45 mg total) by mouth daily. 90 tablet 3   promethazine -dextromethorphan (PROMETHAZINE -DM) 6.25-15 MG/5ML syrup Take 5 mLs by mouth 4 (four) times daily as needed. 118 mL 0   rOPINIRole  (REQUIP ) 1 MG tablet Take 1 tablet (1 mg total) by mouth 3 (three) times daily. 90 tablet 2   sertraline  (ZOLOFT ) 25 MG tablet Take 1 tablet (25 mg total) by mouth daily. 90 tablet 3   trospium  (SANCTURA ) 20 MG tablet Take 1 tablet (20 mg total) by mouth at bedtime. 30 tablet 5   valACYclovir  (VALTREX ) 500 MG tablet TAKE ONE TABLET BY MOUTH EVERY DAY 90 tablet 3   omeprazole  (PRILOSEC) 20 MG capsule Take 1 capsule (20 mg total) by mouth daily. 90 capsule 3   No facility-administered medications prior to visit.    Allergies[1]  Review of Systems  Constitutional:  Negative for chills and fever.  HENT:  Negative for congestion, sinus pain and sore throat.   Eyes:  Negative for pain and discharge.  Respiratory:  Negative for cough and shortness of breath.   Cardiovascular:  Negative for chest pain and palpitations.  Gastrointestinal:  Positive for abdominal pain, diarrhea, nausea and vomiting.  Endocrine: Negative for polydipsia and polyuria.  Genitourinary:  Negative  for dysuria and hematuria.  Musculoskeletal:  Negative for neck pain and neck stiffness.  Skin:  Negative for rash.  Neurological:  Negative for dizziness and weakness.  Psychiatric/Behavioral:  Negative for agitation and behavioral problems.        Objective:    Physical Exam Vitals reviewed.  Constitutional:      General: She is not in acute distress.    Appearance: She is not diaphoretic.  HENT:     Head: Normocephalic and atraumatic.     Nose: Nose normal.     Mouth/Throat:     Mouth: Mucous membranes are moist.  Eyes:     General: No scleral icterus.    Extraocular Movements: Extraocular movements intact.  Cardiovascular:     Rate and Rhythm: Normal rate and regular rhythm.     Heart sounds: Normal heart sounds. No murmur heard. Pulmonary:     Breath sounds: Normal breath sounds. No wheezing or rales.  Abdominal:     Palpations: Abdomen is soft.     Tenderness: There is no abdominal tenderness.  Musculoskeletal:     Cervical back: Neck supple. No tenderness.     Right lower leg: No edema.     Left lower leg: No edema.  Skin:    General: Skin is warm.     Findings: No rash.  Neurological:     General: No focal deficit present.     Mental Status: She is alert and oriented to person, place, and time.  Psychiatric:        Mood and Affect: Mood normal.        Behavior: Behavior normal.     BP 126/68 (BP Location: Left Arm)   Pulse 80   Ht 5' (1.524 m)   Wt 110 lb (49.9 kg)   SpO2 97%   BMI 21.48 kg/m  Wt Readings from Last 3 Encounters:  02/12/24 110  lb (49.9 kg)  12/12/23 110 lb (49.9 kg)  12/11/23 110 lb (49.9 kg)        Assessment & Plan:   Problem List Items Addressed This Visit       Digestive   Irritable bowel syndrome with diarrhea   Can take Imodium as needed for diarrhea Maintain adequate hydration and eat at regular intervals Keep food diary If persistent or worsening symptoms, may need GI evaluation again      Relevant Medications    omeprazole  (PRILOSEC) 20 MG capsule   metoCLOPramide  (REGLAN ) 5 MG tablet   GERD (gastroesophageal reflux disease) - Primary   Uncontrolled Epigastric discomfort and nausea could be related to GERD and/or gastroparesis Avoid hot and spicy foods Started Reglan  for gastroparesis Increased dose of omeprazole  to 20 mg BID for GERD      Relevant Medications   omeprazole  (PRILOSEC) 20 MG capsule   metoCLOPramide  (REGLAN ) 5 MG tablet   Diabetic gastroparesis associated with type 2 diabetes mellitus (HCC)   Previous gastric emptying study showed mild gastroparesis Recently her type II DM has been uncontrolled as well Started Reglan  for gastroparesis Increased dose of omeprazole  to 20 mg BID for GERD      Relevant Medications   metoCLOPramide  (REGLAN ) 5 MG tablet     Endocrine   Type 2 diabetes mellitus (HCC)   Lab Results  Component Value Date   HGBA1C 8.0 (H) 12/11/2023   Uncontrolled, awaiting endocrinology evaluation On Tresiba  18 units nightly and Actos  currently Advised to follow diabetic diet On statin  She has concern about early morning hypoglycemia - advised to have dinner around 7 PM to avoid prolonged fasting (usually takes dinner at 5 PM and wakes up at 7 AM in the morning)        Meds ordered this encounter  Medications   omeprazole  (PRILOSEC) 20 MG capsule    Sig: Take 1 capsule (20 mg total) by mouth 2 (two) times daily before a meal.    Dispense:  180 capsule    Refill:  1    This prescription was filled on 12/04/2022. Any refills authorized will be placed on file.   metoCLOPramide  (REGLAN ) 5 MG tablet    Sig: Take 1 tablet (5 mg total) by mouth every 8 (eight) hours as needed for nausea.    Dispense:  90 tablet    Refill:  1     Karen Phillipson MARLA Blanch, MD     [1]  Allergies Allergen Reactions   Codeine Rash    Other reaction(s): Chest Pain   Gabapentin Other (See Comments)    Other reaction(s): Other Depression, suicidal thoughts   Sulfa  Antibiotics Rash and Other (See Comments)    chest Pain   Atorvastatin  Calcium  Other (See Comments)    Other reaction(s): Other myalgia   Escitalopram Other (See Comments)    Other reaction(s): Other Headaches   Ezetimibe Other (See Comments)    Other reaction(s): Other Myalgia-patient wants to retry generic 10/17   Fluvastatin Other (See Comments)    Other reaction(s): Other myalgia   Fosamax [Alendronate]     Other reaction(s): Other Leg pain   Levofloxacin Other (See Comments)    Other reaction(s): Other Muscle aching in her shoulders, inner thighs and legs   Pramipexole Nausea Only and Other (See Comments)    Headache    Prednisone Other (See Comments)    Unknown reaction type   Rosuvastatin Calcium  Other (See Comments)    Other reaction(s): Other myalgia  Simvastatin Other (See Comments)    Other reaction(s): Other myalgia   Sulfur  Rash   Trazodone And Nefazodone Nausea Only   "

## 2024-02-18 ENCOUNTER — Ambulatory Visit (HOSPITAL_COMMUNITY)
Admission: RE | Admit: 2024-02-18 | Discharge: 2024-02-18 | Disposition: A | Discharge status: Home / Self Care | Source: Ambulatory Visit

## 2024-02-18 DIAGNOSIS — R928 Other abnormal and inconclusive findings on diagnostic imaging of breast: Secondary | ICD-10-CM

## 2024-02-21 ENCOUNTER — Encounter: Payer: Self-pay | Admitting: Internal Medicine

## 2024-02-24 ENCOUNTER — Other Ambulatory Visit: Payer: Self-pay | Admitting: Internal Medicine

## 2024-02-24 ENCOUNTER — Ambulatory Visit: Payer: Self-pay

## 2024-02-24 ENCOUNTER — Emergency Department (HOSPITAL_COMMUNITY)

## 2024-02-24 ENCOUNTER — Emergency Department (HOSPITAL_COMMUNITY)
Admission: EM | Admit: 2024-02-24 | Discharge: 2024-02-24 | Disposition: A | Discharge status: Home / Self Care | Attending: Emergency Medicine | Admitting: Emergency Medicine

## 2024-02-24 ENCOUNTER — Encounter (HOSPITAL_COMMUNITY): Payer: Self-pay

## 2024-02-24 ENCOUNTER — Encounter: Payer: Self-pay | Admitting: Internal Medicine

## 2024-02-24 ENCOUNTER — Other Ambulatory Visit (HOSPITAL_COMMUNITY): Payer: Self-pay | Admitting: Pharmacist

## 2024-02-24 DIAGNOSIS — Z794 Long term (current) use of insulin: Secondary | ICD-10-CM | POA: Diagnosis not present

## 2024-02-24 DIAGNOSIS — E1143 Type 2 diabetes mellitus with diabetic autonomic (poly)neuropathy: Secondary | ICD-10-CM

## 2024-02-24 DIAGNOSIS — Z7982 Long term (current) use of aspirin: Secondary | ICD-10-CM | POA: Diagnosis not present

## 2024-02-24 DIAGNOSIS — R1032 Left lower quadrant pain: Secondary | ICD-10-CM | POA: Diagnosis present

## 2024-02-24 LAB — COMPREHENSIVE METABOLIC PANEL WITH GFR
ALT: 22 U/L (ref 0–44)
AST: 26 U/L (ref 15–41)
Albumin: 4.3 g/dL (ref 3.5–5.0)
Alkaline Phosphatase: 87 U/L (ref 38–126)
Anion gap: 12 (ref 5–15)
BUN: 19 mg/dL (ref 8–23)
CO2: 24 mmol/L (ref 22–32)
Calcium: 10.3 mg/dL (ref 8.9–10.3)
Chloride: 102 mmol/L (ref 98–111)
Creatinine, Ser: 0.68 mg/dL (ref 0.44–1.00)
GFR, Estimated: >60 mL/min
Glucose, Bld: 129 mg/dL — ABNORMAL HIGH (ref 70–99)
Potassium: 4.1 mmol/L (ref 3.5–5.1)
Sodium: 137 mmol/L (ref 135–145)
Total Bilirubin: 0.4 mg/dL (ref 0.0–1.2)
Total Protein: 7.1 g/dL (ref 6.5–8.1)

## 2024-02-24 LAB — CBC
HCT: 41 % (ref 36.0–46.0)
Hemoglobin: 13 g/dL (ref 12.0–15.0)
MCH: 29.7 pg (ref 26.0–34.0)
MCHC: 31.7 g/dL (ref 30.0–36.0)
MCV: 93.6 fL (ref 80.0–100.0)
Platelets: 235 K/uL (ref 150–400)
RBC: 4.38 MIL/uL (ref 3.87–5.11)
RDW: 14.3 % (ref 11.5–15.5)
WBC: 5.5 K/uL (ref 4.0–10.5)
nRBC: 0 % (ref 0.0–0.2)

## 2024-02-24 LAB — CBG MONITORING, ED: Glucose-Capillary: 57 mg/dL — ABNORMAL LOW (ref 70–99)

## 2024-02-24 LAB — URINALYSIS, ROUTINE W REFLEX MICROSCOPIC
Bilirubin Urine: NEGATIVE
Glucose, UA: NEGATIVE mg/dL
Hgb urine dipstick: NEGATIVE
Ketones, ur: NEGATIVE mg/dL
Leukocytes,Ua: NEGATIVE
Nitrite: NEGATIVE
Protein, ur: NEGATIVE mg/dL
Specific Gravity, Urine: 1.011 (ref 1.005–1.030)
pH: 6 (ref 5.0–8.0)

## 2024-02-24 LAB — LIPASE, BLOOD: Lipase: 33 U/L (ref 11–51)

## 2024-02-24 MED ORDER — DEXTROSE 50 % IV SOLN
1.0000 | Freq: Once | INTRAVENOUS | Status: AC
  Administered 2024-02-24: 50 mL via INTRAVENOUS
  Filled 2024-02-24: qty 50

## 2024-02-24 MED ORDER — IOHEXOL 300 MG/ML  SOLN
100.0000 mL | Freq: Once | INTRAMUSCULAR | Status: AC | PRN
  Administered 2024-02-24: 100 mL via INTRAVENOUS

## 2024-02-24 MED ORDER — METHOCARBAMOL 500 MG PO TABS: 500.0000 mg | ORAL_TABLET | Freq: Three times a day (TID) | ORAL | 0 refills | Status: AC | PRN

## 2024-02-24 NOTE — ED Triage Notes (Signed)
 Pt c/o increasing pain in LLQ.  Pain score 8/10 w/ palpation.  Pt reports a tender knot under the skin.  No swelling or discoloration noted.  Denies n/v/d.

## 2024-02-24 NOTE — ED Notes (Signed)
  Pt teaching provided on medications that may cause drowsiness. Pt instructed not to drive or operate heavy machinery while taking the prescribed medication. Pt verbalized understanding.   Pt provided discharge instructions and prescription information. Pt was given the opportunity to ask questions and questions were answered.

## 2024-02-24 NOTE — ED Provider Notes (Signed)
 " [facility] Provider Note   CSN: [id number] Arrival date & time: 02/24/24  1243     Patient presents with: Abdominal Pain   Karen Horn is a 79 y.o. female.    Abdominal Pain Patient presents with left lower quadrant abdominal pain.  Tender on the skin.  Reportedly has a knot.  No discoloration.  No nausea vomiting diarrhea.  No fevers.     Prior to Admission medications  Medication Sig Start Date End Date Taking? Authorizing Provider  methocarbamol  (ROBAXIN ) 500 MG tablet Take 1 tablet (500 mg total) by mouth every 8 (eight) hours as needed. 02/24/24  Yes Patsey Lot, MD  ACCU-CHEK AVIVA PLUS test strip USE AS DIRECTED TO TEST THREE TIMES DAILY. 11/05/23   Bevely Doffing, FNP  albuterol  (VENTOLIN  HFA) 108 (90 Base) MCG/ACT inhaler Inhale 1-2 puffs into the lungs every 6 (six) hours as needed for wheezing or shortness of breath. 01/24/24   Arlyss Doffing BRAVO, PA-C  aspirin  81 MG chewable tablet Chew 81 mg by mouth in the morning.    [provider]  atorvastatin  (LIPITOR) 40 MG tablet TAKE 1 TABLET BY MOUTH ONCE A DAY 05/30/23   Melvenia Manus BRAVO, MD  benzonatate  (TESSALON ) 100 MG capsule Take 1 capsule (100 mg total) by mouth every 8 (eight) hours. 01/24/24   Graham, Laura E, PA-C  Blood Glucose Monitoring Suppl (ACCU-CHEK AVIVA PLUS) w/Device KIT by Does not apply route. 07/03/18   [provider]  cholecalciferol (VITAMIN D ) 25 MCG (1000 UNIT) tablet Take 2,000 Units by mouth in the morning.    [provider]  co-enzyme Q-10 30 MG capsule Take 30 mg by mouth 3 (three) times daily.    [provider]  Continuous Glucose Receiver (DEXCOM G7 RECEIVER) DEVI 1 Device by Does not apply route continuous. 09/13/22   Melvenia Manus BRAVO, MD  Continuous Glucose Sensor (DEXCOM G7 SENSOR) MISC 1 each by Does not apply route every 14 (fourteen) days. 12/26/23   Bevely Doffing, FNP  EPINEPHrine  0.3 mg/0.3 mL IJ SOAJ  injection epinephrine  0.3 mg/0.3 mL injection, auto-injector Patient taking differently: Inject 0.3 mg into the muscle as needed for anaphylaxis. 08/16/21   Cheryl Reusing, FNP  insulin  degludec (TRESIBA  FLEXTOUCH) 200 UNIT/ML FlexTouch Pen Inject 18 Units into the skin in the morning. 10/09/23   Bevely Doffing, FNP  Insulin  Pen Needle (SURE COMFORT PEN NEEDLES) 31G X 5 MM MISC Sure Comfort Pen Needle 31 gauge x 5/16 04/17/22   Melvenia Manus BRAVO, MD  levothyroxine  (SYNTHROID ) 75 MCG tablet Take 1 tablet (75 mcg total) by mouth daily before breakfast. 10/09/23 04/06/24  Bevely Doffing, FNP  metoCLOPramide  (REGLAN ) 5 MG tablet Take 1 tablet (5 mg total) by mouth every 8 (eight) hours as needed for nausea. 02/12/24   Tobie Suzzane POUR, MD  omeprazole  (PRILOSEC) 20 MG capsule Take 1 capsule (20 mg total) by mouth 2 (two) times daily before a meal. 02/12/24   Patel, Suzzane POUR, MD  oxyCODONE  (OXY IR/ROXICODONE ) 5 MG immediate release tablet TAKE 1-2 TABLETS BY MOUTH EVERY 6 HOURS AS NEEDED FOR SEVERE POSTOP PAIN    [provider]  pioglitazone  (ACTOS ) 45 MG tablet Take 1 tablet (45 mg total) by mouth daily. 12/27/23   Bevely Doffing, FNP  promethazine -dextromethorphan (PROMETHAZINE -DM) 6.25-15 MG/5ML syrup Take 5 mLs by mouth 4 (four) times daily as needed. 03/04/23   Bacchus, Gloria Z, FNP  rOPINIRole  (REQUIP ) 1 MG tablet Take 1 tablet (  1 mg total) by mouth 3 (three) times daily. 08/26/23 02/12/24  Bevely Doffing, FNP  sertraline  (ZOLOFT ) 25 MG tablet Take 1 tablet (25 mg total) by mouth daily. 06/20/23   Melvenia Manus BRAVO, MD  trospium  (SANCTURA ) 20 MG tablet Take 1 tablet (20 mg total) by mouth at bedtime. 10/29/23   Bevely Doffing, FNP  valACYclovir  (VALTREX ) 500 MG tablet TAKE ONE TABLET BY MOUTH EVERY DAY 12/12/23   Bevely Doffing, FNP    Allergies: Codeine, Gabapentin, Sulfa antibiotics, Atorvastatin  calcium , Escitalopram, Ezetimibe, Fluvastatin, Fosamax [alendronate], Levofloxacin, Pramipexole,  Prednisone, Rosuvastatin calcium , Simvastatin, Sulfur , and Trazodone and nefazodone    Review of Systems  Gastrointestinal:  Positive for abdominal pain.    Updated Vital Signs BP (!) 151/74   Pulse 77   Temp 99.3 F (37.4 C) (Oral)   Resp 18   Ht 5' (1.524 m)   Wt 49.9 kg   SpO2 98%   BMI 21.48 kg/m   Physical Exam Vitals and nursing note reviewed.  Abdominal:     Tenderness: There is abdominal tenderness.     Comments: Scar from previous likely hysterectomy.  From umbilicus down and vertical.  Does have left inguinal/left lower quadrant tenderness.  No definite mass palpated.  Neurological:     Mental Status: She is alert.     (all labs ordered are listed, but only abnormal results are displayed) Labs Reviewed  COMPREHENSIVE METABOLIC PANEL WITH GFR - Abnormal; Notable for the following components:      Result Value   Glucose, Bld 129 (*)    All other components within normal limits  URINALYSIS, ROUTINE W REFLEX MICROSCOPIC - Abnormal; Notable for the following components:   Color, Urine STRAW (*)    All other components within normal limits  CBG MONITORING, ED - Abnormal; Notable for the following components:   Glucose-Capillary 57 (*)    All other components within normal limits  LIPASE, BLOOD  CBC    EKG: None  Radiology: CT ABDOMEN PELVIS W CONTRAST Result Date: 02/24/2024 EXAM: CT ABDOMEN AND PELVIS WITH CONTRAST 02/24/2024 06:02:37 PM TECHNIQUE: CT of the abdomen and pelvis was performed with the administration of 100 mL of iohexol  (OMNIPAQUE ) 300 MG/ML solution. Multiplanar reformatted images are provided for review. Automated exposure control, iterative reconstruction, and/or weight-based adjustment of the mA/kV was utilized to reduce the radiation dose to as low as reasonably achievable. COMPARISON: CT chest abdomen and pelvis 03/08/2021. CLINICAL HISTORY: LLQ abdominal pain. FINDINGS: LOWER CHEST: There is some wall thickening of the distal esophagus.  LIVER: The liver is unremarkable. GALLBLADDER AND BILE DUCTS: Gallbladder surgically absent. No biliary ductal dilatation. SPLEEN: No acute abnormality. PANCREAS: No acute abnormality. ADRENAL GLANDS: No acute abnormality. KIDNEYS, URETERS AND BLADDER: No stones in the kidneys or ureters. No hydronephrosis. No perinephric or periureteral stranding. Urinary bladder is unremarkable. GI AND BOWEL: Stomach demonstrates no acute abnormality. Sigmoid colon diverticulosis. There is no bowel obstruction. APPENDIX: The appendix is not visualized. PERITONEUM AND RETROPERITONEUM: No ascites. No free air. VASCULATURE: Aorta is normal in caliber. There are atherosclerotic calcifications of the aorta. LYMPH NODES: No lymphadenopathy. REPRODUCTIVE ORGANS: Uterus is not visualized. BONES AND SOFT TISSUES: Multilevel degenerative changes affect the spine. There are bilateral hip arthroplasties. No acute osseous abnormality. No focal soft tissue abnormality. IMPRESSION: 1. No acute findings in the abdomen or pelvis. 2. Sigmoid colon diverticulosis without evidence of diverticulitis. 3. Distal esophageal wall thickening, which can be seen with esophagitis; consider endoscopic evaluation as indicated. Electronically signed by:  Greig Pique MD 02/24/2024 06:59 PM EST RP Workstation: [id number]     Procedures   Medications Ordered in the ED  iohexol  (OMNIPAQUE ) 300 MG/ML solution 100 mL (100 mLs Intravenous Contrast Given 02/24/24 1750)  dextrose  50 % solution 50 mL (50 mLs Intravenous Given 02/24/24 1837)                                    Medical Decision Making Amount and/or Complexity of Data Reviewed Labs: ordered. Radiology: ordered.  Risk Prescription drug management.   Blood work reassuring.  Left lower quadrant pain.  Differential diagnose includes diverticulitis and hernia.  Also internal hernia.  Will get CT scan to evaluate.  Blood work reassuring.  Also CT scan does not show clear cause.  Will have  patient follow-up with PCP.  Will discharge     Final diagnoses:  Left lower quadrant abdominal pain    ED Discharge Orders          Ordered    methocarbamol  (ROBAXIN ) 500 MG tablet  Every 8 hours PRN        02/24/24 1908               Patsey Lot, MD 02/24/24 2306  "

## 2024-02-24 NOTE — Telephone Encounter (Signed)
 FYI Only or Action Required?: FYI only for provider: ED advised.  Patient was last seen in primary care on 02/12/2024 by Karen Suzzane POUR, MD.  Called Nurse Triage reporting Abdominal Pain.  Symptoms began several days ago.  Interventions attempted: Rest, hydration, or home remedies.  Symptoms are: gradually worsening.  Triage Disposition: Go to ED Now (Notify PCP)  Patient/caregiver understands and will follow disposition?: Yes             Reason for Disposition  [1] SEVERE pain AND [2] age > 60 years  Answer Assessment - Initial Assessment Questions Patient reports  the following symptoms started 3 days ago,  went to bathroom to urinate and when pulled up underwear touched spot on stomach above left thigh (lower left abdomen) , spouse says there is knot under there. Pain in the area with any pressure applied barely touch 7-8/10 , pressing on it hurts even worse. Spouse spoke with patient GI doctor who wants xray. This RN advised ER now for evaluation for this and patient is agreeable    1. LOCATION: Where does it hurt?      Patient is reporting pain in lower left abdomen  2. RADIATION: Does the pain shoot anywhere else? (e.g., chest, back)     Reports its localized to this area  3. ONSET: When did the pain begin? (e.g., minutes, hours or days ago)      3 days ago  4. SUDDEN: Gradual or sudden onset?     Sudden  5. PATTERN Does the pain come and go, or is it constant?     Any light touch 7-8?10 pressure high pain  6. SEVERITY: How bad is the pain?  (e.g., Scale 1-10; mild, moderate, or severe)     Severe  7. RECURRENT SYMPTOM: Have you ever had this type of stomach pain before? If Yes, ask: When was the last time? and What happened that time?      Doesn't report this in past  8. CAUSE: What do you think is causing the stomach pain? (e.g., gallstones, recent abdominal surgery)     Unsure  9. RELIEVING/AGGRAVATING FACTORS: What makes it better or  worse? (e.g., antacids, bending or twisting motion, bowel movement)     Not touching it  10. OTHER SYMPTOMS: Do you have any other symptoms? (e.g., back pain, diarrhea, fever, urination pain, vomiting)        Patient denies the following chest pain, shortness of breath, fever, vomiting  Protocols used: Abdominal Pain - [facility]  Copied from CRM #[id number]. Topic: Clinical - Red Word Triage >> Feb 24, 2024 11:52 AM Avram MATSU wrote: Red Word that prompted transfer to Nurse Triage: very painful tender to the touch lower stomach left pelvic/hip. Slow emptying stomach.

## 2024-02-24 NOTE — Progress Notes (Signed)
 Prolia  tx plan discontinued. Last dose 10/30/2022. Patient never scheduled follow-up doses despite outreach  Lateisha Thurlow, PharmD, MPH, BCPS, CPP Clinical Pharmacist

## 2024-02-24 NOTE — Telephone Encounter (Signed)
 Noted ED advised

## 2024-02-25 LAB — CBG MONITORING, ED: Glucose-Capillary: 168 mg/dL — ABNORMAL HIGH (ref 70–99)

## 2024-02-26 ENCOUNTER — Ambulatory Visit: Admitting: "Endocrinology

## 2024-02-26 ENCOUNTER — Encounter: Payer: Self-pay | Admitting: "Endocrinology

## 2024-02-26 VITALS — BP 116/56 | HR 68 | Ht 60.0 in | Wt 111.2 lb

## 2024-02-26 DIAGNOSIS — E039 Hypothyroidism, unspecified: Secondary | ICD-10-CM

## 2024-02-26 DIAGNOSIS — E782 Mixed hyperlipidemia: Secondary | ICD-10-CM | POA: Diagnosis not present

## 2024-02-26 DIAGNOSIS — Z794 Long term (current) use of insulin: Secondary | ICD-10-CM

## 2024-02-26 DIAGNOSIS — E1165 Type 2 diabetes mellitus with hyperglycemia: Secondary | ICD-10-CM

## 2024-02-26 MED ORDER — TRESIBA FLEXTOUCH 200 UNIT/ML ~~LOC~~ SOPN: 18.0000 [IU] | PEN_INJECTOR | Freq: Every day | SUBCUTANEOUS | 1 refills | Status: AC

## 2024-02-26 MED ORDER — PIOGLITAZONE HCL 30 MG PO TABS: 30.0000 mg | ORAL_TABLET | Freq: Every day | ORAL | 1 refills | Status: AC

## 2024-02-26 MED ORDER — GLIPIZIDE ER 2.5 MG PO TB24: 2.5000 mg | ORAL_TABLET | Freq: Every day | ORAL | 1 refills | Status: AC

## 2024-02-26 NOTE — Progress Notes (Signed)
 "                                                                                 Endocrinology Consult Note       02/26/2024, 3:28 PM   Subjective:    Patient ID: Karen Horn, female    DOB: 09/12/1945.  Karen Horn is being seen in consultation for management of currently uncontrolled symptomatic diabetes requested by  Bevely Doffing, FNP.   Past Medical History:  Diagnosis Date   Diabetes mellitus without complication (HCC)    Hypothyroidism    Thyroid  disease     Past Surgical History:  Procedure Laterality Date   ABDOMINAL HYSTERECTOMY     ADENOIDECTOMY     BACK SURGERY     BIOPSY  09/06/2021   Procedure: BIOPSY;  Surgeon: Eartha Flavors, Toribio, MD;  Location: [facility];  Service: Gastroenterology;;   ESOPHAGOGASTRODUODENOSCOPY (EGD) WITH PROPOFOL  N/A 09/06/2021   Procedure: ESOPHAGOGASTRODUODENOSCOPY (EGD) WITH PROPOFOL ;  Surgeon: Eartha Flavors Toribio, MD;  Location: [facility];  Service: Gastroenterology;  Laterality: N/A;  215 ASA 2   KNEE ARTHROSCOPY WITH MEDIAL MENISECTOMY Left 02/27/2022   Procedure: KNEE ARTHROSCOPY WITH MEDIAL MENISCECTOMY;  Surgeon: Margrette Taft BRAVO, MD;  Location: [facility];  Service: Orthopedics;  Laterality: Left;   SINOSCOPY     TONSILLECTOMY     TOTAL HIP ARTHROPLASTY Bilateral     Social History   Socioeconomic History   Marital status: Married    Spouse name: Not on file   Number of children: Not on file   Years of education: Not on file   Highest education level: Not on file  Occupational History   Not on file  Tobacco Use   Smoking status: Never   Smokeless tobacco: Never  Vaping Use   Vaping status: Never Used  Substance and Sexual Activity   Alcohol use: Not Currently   Drug use: Never   Sexual activity: Not Currently  Other Topics Concern   Not on file  Social History Narrative   Not on file   Social Drivers of Health   Tobacco Use: Low Risk (02/26/2024)   Patient History    Smoking Tobacco  Use: Never    Smokeless Tobacco Use: Never    Passive Exposure: Not on file  Financial Resource Strain: Low Risk (12/12/2023)   Overall Financial Resource Strain (CARDIA)    Difficulty of Paying Living Expenses: Not hard at all  Food Insecurity: No Food Insecurity (12/12/2023)   Epic    Worried About Radiation Protection Practitioner of Food in the Last Year: Never true    Ran Out of Food in the Last Year: Never true  Transportation Needs: No Transportation Needs (12/12/2023)   Epic    Lack of Transportation (Medical): No    Lack of Transportation (Non-Medical): No  Physical Activity: Sufficiently Active (12/12/2023)   Exercise Vital Sign    Days of Exercise per Week: 7 days    Minutes of Exercise per Session: 30 min  Stress: No Stress Concern Present (12/12/2023)   [employer] of Occupational Health - Occupational Stress Questionnaire    Feeling of Stress: Not at all  Social Connections: Socially Integrated (  12/12/2023)   Social Connection and Isolation Panel    Frequency of Communication with Friends and Family: More than three times a week    Frequency of Social Gatherings with Friends and Family: Once a week    Attends Religious Services: More than 4 times per year    Active Member of Clubs or Organizations: Yes    Attends Banker Meetings: More than 4 times per year    Marital Status: Married  Depression (PHQ2-9): Low Risk (12/12/2023)   Depression (PHQ2-9)    PHQ-2 Score: 0  Recent Concern: Depression (PHQ2-9) - Medium Risk (09/20/2023)   Depression (PHQ2-9)    PHQ-2 Score: 5  Alcohol Screen: Low Risk (12/12/2023)   Alcohol Screen    Last Alcohol Screening Score (AUDIT): 0  Housing: Low Risk (12/12/2023)   Epic    Unable to Pay for Housing in the Last Year: No    Number of Times Moved in the Last Year: 0    Homeless in the Last Year: No  Utilities: Not At Risk (12/12/2023)   Epic    Threatened with loss of utilities: No  Health Literacy: Adequate Health Literacy  (12/12/2023)   B1300 Health Literacy    Frequency of need for help with medical instructions: Never    Family History  Problem Relation Age of Onset   Cancer Mother    Thyroid  disease Mother    Hyperlipidemia Mother    Heart attack Mother    Stroke Mother    Osteoporosis Mother    Cancer Father    Heart attack Father    Hyperlipidemia Father    Hypertension Father    Thyroid  disease Father     Outpatient Encounter Medications as of 02/26/2024  Medication Sig   glipiZIDE  (GLUCOTROL  XL) 2.5 MG 24 hr tablet Take 1 tablet (2.5 mg total) by mouth daily with breakfast.   [DISCONTINUED] insulin  degludec (TRESIBA  FLEXTOUCH) 200 UNIT/ML FlexTouch Pen Inject 18 Units into the skin in the morning. (Patient taking differently: Inject 22 Units into the skin in the morning.)   ACCU-CHEK AVIVA PLUS test strip USE AS DIRECTED TO TEST THREE TIMES DAILY.   albuterol  (VENTOLIN  HFA) 108 (90 Base) MCG/ACT inhaler Inhale 1-2 puffs into the lungs every 6 (six) hours as needed for wheezing or shortness of breath. (Patient not taking: Reported on 02/26/2024)   aspirin  81 MG chewable tablet Chew 81 mg by mouth in the morning.   atorvastatin  (LIPITOR) 40 MG tablet TAKE 1 TABLET BY MOUTH ONCE A DAY   benzonatate  (TESSALON ) 100 MG capsule Take 1 capsule (100 mg total) by mouth every 8 (eight) hours. (Patient not taking: Reported on 02/26/2024)   Blood Glucose Monitoring Suppl (ACCU-CHEK AVIVA PLUS) w/Device KIT by Does not apply route.   cholecalciferol (VITAMIN D ) 25 MCG (1000 UNIT) tablet Take 2,000 Units by mouth in the morning.   co-enzyme Q-10 30 MG capsule Take 30 mg by mouth 3 (three) times daily. (Patient not taking: Reported on 02/26/2024)   Continuous Glucose Receiver (DEXCOM G7 RECEIVER) DEVI 1 Device by Does not apply route continuous.   Continuous Glucose Sensor (DEXCOM G7 SENSOR) MISC 1 each by Does not apply route every 14 (fourteen) days.   EPINEPHrine  0.3 mg/0.3 mL IJ SOAJ injection epinephrine  0.3  mg/0.3 mL injection, auto-injector (Patient taking differently: Inject 0.3 mg into the muscle as needed for anaphylaxis.)   insulin  degludec (TRESIBA  FLEXTOUCH) 200 UNIT/ML FlexTouch Pen Inject 18 Units into the skin at bedtime.  Insulin  Pen Needle (SURE COMFORT PEN NEEDLES) 31G X 5 MM MISC Sure Comfort Pen Needle 31 gauge x 5/16   levothyroxine  (SYNTHROID ) 75 MCG tablet Take 1 tablet (75 mcg total) by mouth daily before breakfast.   methocarbamol  (ROBAXIN ) 500 MG tablet Take 1 tablet (500 mg total) by mouth every 8 (eight) hours as needed.   metoCLOPramide  (REGLAN ) 5 MG tablet Take 1 tablet (5 mg total) by mouth every 8 (eight) hours as needed for nausea.   omeprazole  (PRILOSEC) 20 MG capsule Take 1 capsule (20 mg total) by mouth 2 (two) times daily before a meal.   oxyCODONE  (OXY IR/ROXICODONE ) 5 MG immediate release tablet TAKE 1-2 TABLETS BY MOUTH EVERY 6 HOURS AS NEEDED FOR SEVERE POSTOP PAIN (Patient not taking: Reported on 02/26/2024)   pioglitazone  (ACTOS ) 30 MG tablet Take 1 tablet (30 mg total) by mouth daily with breakfast.   promethazine -dextromethorphan (PROMETHAZINE -DM) 6.25-15 MG/5ML syrup Take 5 mLs by mouth 4 (four) times daily as needed.   rOPINIRole  (REQUIP ) 1 MG tablet Take 1 tablet (1 mg total) by mouth 3 (three) times daily.   sertraline  (ZOLOFT ) 25 MG tablet Take 1 tablet (25 mg total) by mouth daily. (Patient not taking: Reported on 02/26/2024)   trospium  (SANCTURA ) 20 MG tablet Take 1 tablet (20 mg total) by mouth at bedtime. (Patient not taking: Reported on 02/26/2024)   valACYclovir  (VALTREX ) 500 MG tablet TAKE ONE TABLET BY MOUTH EVERY DAY   [DISCONTINUED] pioglitazone  (ACTOS ) 45 MG tablet Take 1 tablet (45 mg total) by mouth daily.   No facility-administered encounter medications on file as of 02/26/2024.    ALLERGIES: Allergies[1]  VACCINATION STATUS: Immunization History  Administered Date(s) Administered   Fluad Quad(high Dose 65+) 01/11/2021, 11/29/2021, 11/13/2022    Influenza,trivalent, recombinat, inj, PF 02/19/2006, 11/20/2010   Influenza-Unspecified 02/18/2012, 11/29/2021, 11/22/2023   Pneumococcal Conjugate-13 11/20/2015, 04/26/2016   Pneumococcal Polysaccharide-23 11/20/2017   Pneumococcal-Unspecified 06/28/2000   Td 09/27/2005   Tdap 09/27/2005    Diabetes She presents for her initial diabetic visit. Onset time: She was diagnosed in her 69s. There are no hypoglycemic associated symptoms. Pertinent negatives for hypoglycemia include no confusion, headaches, pallor or seizures. Pertinent negatives for diabetes include no chest pain, no fatigue, no polydipsia, no polyphagia and no polyuria. There are no hypoglycemic complications. Symptoms are worsening. Risk factors for coronary artery disease include dyslipidemia, family history, post-menopausal and sedentary lifestyle. Current diabetic treatment includes insulin  injections (She is on Tresiba  22 units every morning and Actos  45 mg p.o. daily.). Her weight is fluctuating minimally (The most patient weight was 130 pounds, her weight relatively stable around 110). She is following a generally unhealthy diet. She has not had a previous visit with a dietitian. She participates in exercise intermittently. Her home blood glucose trend is increasing steadily. Her breakfast blood glucose range is generally 140-180 mg/dl. Her lunch blood glucose range is generally 140-180 mg/dl. Her dinner blood glucose range is generally 180-200 mg/dl. Her bedtime blood glucose range is generally 180-200 mg/dl. Her overall blood glucose range is 180-200 mg/dl. (She presents with her Dexcom CGM showing average blood glucose of 188 mg per DL for the most recent 2 weeks.  Overview shows 51% time in range, 27% level 1 hyperglycemia, 2% level 2 hyperglycemia.  She did not document hypoglycemia.  Her recent A1c was 8% in October 2025.) An ACE inhibitor/angiotensin II receptor blocker is not being taken.  Hyperlipidemia This is a chronic  problem. The current episode started more than 1  year ago. The problem is controlled. Exacerbating diseases include diabetes. Pertinent negatives include no chest pain, myalgias or shortness of breath. Current antihyperlipidemic treatment includes statins. Risk factors for coronary artery disease include dyslipidemia, diabetes mellitus, a sedentary lifestyle, post-menopausal and family history.     Review of Systems  Constitutional:  Negative for chills, fatigue, fever and unexpected weight change.  HENT:  Negative for trouble swallowing and voice change.   Eyes:  Negative for visual disturbance.  Respiratory:  Negative for cough, shortness of breath and wheezing.   Cardiovascular:  Negative for chest pain, palpitations and leg swelling.  Gastrointestinal:  Negative for diarrhea, nausea and vomiting.  Endocrine: Negative for cold intolerance, heat intolerance, polydipsia, polyphagia and polyuria.  Musculoskeletal:  Negative for arthralgias and myalgias.  Skin:  Negative for color change, pallor, rash and wound.  Neurological:  Negative for seizures and headaches.  Psychiatric/Behavioral:  Negative for confusion and suicidal ideas.     Objective:       02/26/2024    1:28 PM 02/24/2024    7:52 PM 02/24/2024    1:28 PM  Vitals with BMI  Height 5' 0  5' 0  Weight 111 lbs 3 oz  110 lbs  BMI 21.72  21.48  Systolic 116 151 856  Diastolic 56 74 67  Pulse 68 77 82    BP (!) 116/56   Pulse 68   Ht 5' (1.524 m)   Wt 111 lb 3.2 oz (50.4 kg)   BMI 21.72 kg/m   Wt Readings from Last 3 Encounters:  02/26/24 111 lb 3.2 oz (50.4 kg)  02/24/24 110 lb (49.9 kg)  02/12/24 110 lb (49.9 kg)     Physical Exam Constitutional:      Appearance: She is well-developed.  HENT:     Head: Normocephalic and atraumatic.  Neck:     Thyroid : No thyromegaly.     Trachea: No tracheal deviation.  Cardiovascular:     Rate and Rhythm: Normal rate and regular rhythm.  Pulmonary:     Effort: Pulmonary  effort is normal.     Breath sounds: Normal breath sounds.  Abdominal:     Tenderness: There is no abdominal tenderness. There is no guarding.  Musculoskeletal:        General: Normal range of motion.     Cervical back: Normal range of motion and neck supple.  Skin:    General: Skin is warm and dry.     Coloration: Skin is not pale.     Findings: No erythema or rash.  Neurological:     Mental Status: She is alert and oriented to person, place, and time.     Cranial Nerves: No cranial nerve deficit.     Coordination: Coordination normal.     Deep Tendon Reflexes: Reflexes are normal and symmetric.  Psychiatric:        Judgment: Judgment normal.     Comments: Reluctant affect.     CMP ( most recent) CMP     Component Value Date/Time   NA 137 02/24/2024 1348   NA 137 01/24/2024 1257   K 4.1 02/24/2024 1348   CL 102 02/24/2024 1348   CO2 24 02/24/2024 1348   GLUCOSE 129 (H) 02/24/2024 1348   BUN 19 02/24/2024 1348   BUN 16 01/24/2024 1257   CREATININE 0.68 02/24/2024 1348   CALCIUM  10.3 02/24/2024 1348   PROT 7.1 02/24/2024 1348   PROT 6.6 03/21/2023 0923   ALBUMIN 4.3 02/24/2024 1348  ALBUMIN 4.4 03/21/2023 0923   AST 26 02/24/2024 1348   ALT 22 02/24/2024 1348   ALKPHOS 87 02/24/2024 1348   BILITOT 0.4 02/24/2024 1348   BILITOT 0.4 03/21/2023 0923   EGFR 82 01/24/2024 1257   GFRNONAA >60 02/24/2024 1348     Diabetic Labs (most recent): Lab Results  Component Value Date   HGBA1C 8.0 (H) 12/11/2023   HGBA1C 6.8 (H) 09/20/2023   HGBA1C 7.7 (H) 03/21/2023     Lipid Panel ( most recent) Lipid Panel     Component Value Date/Time   CHOL 160 03/21/2023 0923   TRIG 135 03/21/2023 0923   HDL 58 03/21/2023 0923   CHOLHDL 2.8 03/21/2023 0923   LDLCALC 79 03/21/2023 0923   LABVLDL 23 03/21/2023 0923      Lab Results  Component Value Date   TSH 2.090 03/21/2023   TSH 1.590 01/15/2022   FREET4 1.65 03/21/2023   FREET4 1.50 01/15/2022      Assessment &  Plan:   1. Type 2 diabetes mellitus with hyperglycemia, with long-term current use of insulin  (HCC) (Primary)   - Genessa Beman has currently uncontrolled symptomatic type ? DM since  79 years of age,  with most recent A1c of 8 %. Recent labs reviewed. - I had a long discussion with her about the possible risk factors and  the pathology behind diabetes and its complications. Her clinical phenotype does not fit to her typical type 2 diabetes.  She has no significant history of pancreatic insult.  She will need pancreatic antibodies measured to characterize her diabetes better. -her diabetes is complicated by comorbid hyperlipidemia and she remains at a high risk for more acute and chronic complications which include CAD, CVA, CKD, retinopathy, and neuropathy. These are all discussed in detail with her.  - I discussed all available options of managing her diabetes including de-escalation of medications. Patient is encouraged to switch to  unprocessed or minimally processed  complex starch, adequate protein intake (mainly plant source), minimal liquid fat, plenty of fruits, and vegetables. -  she is advised to stick to a routine mealtimes to eat 3 complete meals a day and snack only when necessary (to snack only to correct hypoglycemia BG <70 day time or <100 at night).   - she acknowledges that there is a room for improvement in her food and drink choices. - Further Specific Suggestion is made for her to avoid simple carbohydrates  from her diet including Cakes, Sweet Desserts, Ice Cream, Soda (diet and regular), Sweet Tea, Candies, Chips, Cookies, Store Bought Juices, Alcohol ,  Artificial Sweeteners,  Coffee Creamer, and Sugar-free Products. This will help patient to have more stable blood glucose profile and potentially avoid unintended weight gain.   I have approached her with the following individualized plan to manage  her diabetes and patient agrees:   - Considering her prevailing  glycemic burden, she will be kept on basal insulin .  Advised her to lower her Tresiba  to 18 units but switch to nightly.   I discussed and lowered her Actos  to 30 mg daily at breakfast.  To address postprandial hyperglycemia, she will be considered for low-dose glipizide .  I discussed and added glipizide  2.5 mg XL p.o. daily at breakfast. She is encouraged to continue to use her CGM and call clinic for hypoglycemia under 70 or hyperglycemia above 200 mg per DL weekly average. - she is not a candidate for SGLT2 inhibitors nor GLP-1 receptor agonists due to her body habitus.    -  Specific targets for  A1c;  LDL, HDL,  and Triglycerides were discussed with the patient.  2) hypothyroidism: She has longstanding hypothyroidism on levothyroxine .  Her most recent thyroid  function tests are consistent with appropriate replacement.  She is advised to continue levothyroxine  75 mcg p.o. daily before breakfast.     - We discussed about the correct intake of her thyroid  hormone, on empty stomach at fasting, with water , separated by at least 30 minutes from breakfast and other medications,  and separated by more than 4 hours from calcium , iron, multivitamins, acid reflux medications (PPIs). -Patient is made aware of the fact that thyroid  hormone replacement is needed for life, dose to be adjusted by periodic monitoring of thyroid  function tests.     3) Lipids/Hyperlipidemia:   Review of her recent lipid panel showed  controlled  LDL at 79 .  she  is advised to continue    atorvastatin  40 mg daily at bedtime.  Side effects and precautions discussed with her.  4)  Weight/Diet:  Body mass index is 21.72 kg/m.     she is not a candidate for weight loss. I discussed with her the fact that loss of 5 - 10% of her  current body weight will have the most impact on her diabetes management.    5) Chronic Care/Health Maintenance:  - Her blood pressure is controlled at 116/56 mmHg.  she  is on  Statin medications and  is  encouraged to initiate and continue to follow up with Ophthalmology, Dentist,  Podiatrist at least yearly or according to recommendations, and advised to   stay away from smoking. I have recommended yearly flu vaccine and pneumonia vaccine at least every 5 years; moderate intensity exercise for up to 150 minutes weekly; and  sleep for 7- 9 hours a day.  - she is  advised to maintain close follow up with Bevely Doffing, FNP for primary care needs, as well as her other providers for optimal and coordinated care.   Thank you for involving me in the care of this pleasant patient.  I spent  62  minutes in the care of the patient today including review of labs from CMP, Lipids, Thyroid  Function, Hematology (current and previous including abstractions from other facilities); face-to-face time discussing  her blood glucose readings/logs, discussing hypoglycemia and hyperglycemia episodes and symptoms, medications doses, her options of short and long term treatment based on the latest standards of care / guidelines;  discussion about incorporating lifestyle medicine;  and documenting the encounter. Risk reduction counseling performed per USPSTF guidelines to reduce  cardiovascular risk factors.      Please refer to Patient Instructions for Blood Glucose Monitoring and Insulin /Medications Dosing Guide  in media tab for additional information. Please  also refer to  Patient Self Inventory in the Media  tab for reviewed elements of pertinent patient history.  [name] participated in the discussions, expressed understanding, and voiced agreement with the above plans.  All questions were answered to her satisfaction. she is encouraged to contact clinic should she have any questions or concerns prior to her return visit.   Follow up plan: - Return in about 3 months (around 05/26/2024) for Meter/CGM/Logs, A1c here.  Ranny Earl, MD [facility] [address] [place], [place] [address] Phone: [phone]  Fax: [phone]    02/26/2024, 3:28 PM  This note was partially dictated with voice recognition software. Similar sounding words can be transcribed inadequately or may not  be corrected upon review.     [1]  Allergies Allergen Reactions   Codeine Rash    Other reaction(s): Chest Pain   Gabapentin Other (See Comments)    Other reaction(s): Other Depression, suicidal thoughts   Sulfa Antibiotics Rash and Other (See Comments)    chest Pain   Atorvastatin  Calcium  Other (See Comments)    Other reaction(s): Other myalgia   Escitalopram Other (See Comments)    Other reaction(s): Other Headaches   Ezetimibe Other (See Comments)    Other reaction(s): Other Myalgia-patient wants to retry generic 10/17   Fluvastatin Other (See Comments)    Other reaction(s): Other myalgia   Fosamax [Alendronate]     Other reaction(s): Other Leg pain   Levofloxacin Other (See Comments)    Other reaction(s): Other Muscle aching in her shoulders, inner thighs and legs   Pramipexole Nausea Only and Other (See Comments)    Headache    Prednisone Other (See Comments)    Unknown reaction type   Rosuvastatin Calcium  Other (See Comments)    Other reaction(s): Other myalgia   Simvastatin Other (See Comments)    Other reaction(s): Other myalgia   Sulfur  Rash   Trazodone And Nefazodone Nausea Only   "

## 2024-02-27 ENCOUNTER — Encounter (INDEPENDENT_AMBULATORY_CARE_PROVIDER_SITE_OTHER): Payer: Self-pay | Admitting: *Deleted

## 2024-03-04 ENCOUNTER — Other Ambulatory Visit (HOSPITAL_COMMUNITY): Payer: Self-pay

## 2024-03-04 ENCOUNTER — Telehealth: Payer: Self-pay

## 2024-03-04 ENCOUNTER — Telehealth: Payer: Self-pay | Admitting: Pharmacy Technician

## 2024-03-04 NOTE — Telephone Encounter (Signed)
 Patient was notified.

## 2024-03-04 NOTE — Telephone Encounter (Signed)
 Called and spoke with patient she has been having insulin  spikes happening over 200 and then drops low rapidly. Patient stated she was not tracking her sugars but is on Dexcom and stated this AM while shopping in the store her sugar dropped below 55. Patient would like advise since she is still taking her tresiba , Actos  and Glipizide  as directed.

## 2024-03-04 NOTE — Telephone Encounter (Signed)
 Pharmacy Patient Advocate Encounter   Received notification from [facility] that prior authorization for Dexcom G7 Sensor  is required/requested.   Insurance verification completed.   The patient is insured through [employer].   Per test claim: Refill too soon. PA is not needed at this time. Medication was filled 02/29/2024. Next eligible fill date is 03/23/2024.

## 2024-03-07 ENCOUNTER — Ambulatory Visit: Payer: Self-pay

## 2024-03-13 ENCOUNTER — Telehealth: Payer: Self-pay

## 2024-03-13 NOTE — Telephone Encounter (Signed)
 Pt called stating her glucose has been elevated > than 200 three times in a row but also has experienced hypoglycemia. Pt's Dexcom CGM data uploaded, printed and evaluated by Dr. Lenis. Pt is taking Glipizide  2.5mg  daily, Actos  30mg  daily and injecting Tresiba  14 units at bedtime. Advised pt to decrease Tresiba  to 10 units at bedtime, continue Glipizide  2.5mg  daily and Actos  30mg  daily per Dr. Barbette orders. Pt voiced understanding.

## 2024-03-20 ENCOUNTER — Telehealth: Payer: Self-pay

## 2024-03-20 NOTE — Telephone Encounter (Signed)
 Pt called stating her glucose was 302 last night. Pt's Dexcom CGM data printed and evaluated by Dr. Lenis. Pt is taking Tresiba  10 units at bedtime, Glipizide  2.5mg  daily and Actos  30mg  daily. Dr. Lenis made aware. Advised pt to continue injecting Tresiba  10 units at bedtime and take Actos  30mg  daily but to increase Glipizide  to 5mg  (2 tablets) daily per Dr. Barbette orders. Pt voiced understanding.

## 2024-03-24 LAB — HM DIABETES EYE EXAM

## 2024-04-07 ENCOUNTER — Encounter (INDEPENDENT_AMBULATORY_CARE_PROVIDER_SITE_OTHER): Admitting: Gastroenterology

## 2024-05-28 ENCOUNTER — Ambulatory Visit: Admitting: "Endocrinology

## 2024-06-09 ENCOUNTER — Ambulatory Visit

## 2024-12-14 ENCOUNTER — Ambulatory Visit
